# Patient Record
Sex: Female | Born: 1965 | Race: White | Hispanic: No | Smoking: Never smoker
Health system: Southern US, Community
[De-identification: ages and names within clinical notes are randomized; demographics above are authoritative.]

## PROBLEM LIST (undated history)

## (undated) DIAGNOSIS — D649 Anemia, unspecified: Secondary | ICD-10-CM

## (undated) DIAGNOSIS — R42 Dizziness and giddiness: Secondary | ICD-10-CM

## (undated) DIAGNOSIS — R51 Headache: Secondary | ICD-10-CM

## (undated) DIAGNOSIS — K219 Gastro-esophageal reflux disease without esophagitis: Secondary | ICD-10-CM

## (undated) DIAGNOSIS — R519 Headache, unspecified: Secondary | ICD-10-CM

## (undated) DIAGNOSIS — K8020 Calculus of gallbladder without cholecystitis without obstruction: Secondary | ICD-10-CM

## (undated) DIAGNOSIS — Z8719 Personal history of other diseases of the digestive system: Secondary | ICD-10-CM

## (undated) DIAGNOSIS — T4145XA Adverse effect of unspecified anesthetic, initial encounter: Secondary | ICD-10-CM

## (undated) DIAGNOSIS — C801 Malignant (primary) neoplasm, unspecified: Secondary | ICD-10-CM

## (undated) DIAGNOSIS — T8859XA Other complications of anesthesia, initial encounter: Secondary | ICD-10-CM

## (undated) DIAGNOSIS — M1990 Unspecified osteoarthritis, unspecified site: Secondary | ICD-10-CM

## (undated) DIAGNOSIS — M549 Dorsalgia, unspecified: Secondary | ICD-10-CM

## (undated) DIAGNOSIS — S3992XA Unspecified injury of lower back, initial encounter: Secondary | ICD-10-CM

## (undated) DIAGNOSIS — K2270 Barrett's esophagus without dysplasia: Secondary | ICD-10-CM

## (undated) DIAGNOSIS — G8929 Other chronic pain: Secondary | ICD-10-CM

## (undated) DIAGNOSIS — Z9889 Other specified postprocedural states: Secondary | ICD-10-CM

## (undated) HISTORY — DX: Anemia, unspecified: D64.9

## (undated) HISTORY — PX: BREAST EXCISIONAL BIOPSY: SUR124

## (undated) HISTORY — PX: LEEP: SHX91

## (undated) HISTORY — PX: BREAST SURGERY: SHX581

## (undated) HISTORY — DX: Unspecified osteoarthritis, unspecified site: M19.90

## (undated) HISTORY — DX: Barrett's esophagus without dysplasia: K22.70

## (undated) HISTORY — PX: HAND SURGERY: SHX662

## (undated) HISTORY — DX: Personal history of other diseases of the digestive system: Z87.19

## (undated) HISTORY — DX: Other specified postprocedural states: Z98.890

## (undated) HISTORY — PX: CARPAL TUNNEL RELEASE: SHX101

## (undated) HISTORY — PX: TONSILLECTOMY: SUR1361

---

## 2010-03-19 ENCOUNTER — Emergency Department (HOSPITAL_COMMUNITY)
Admission: EM | Admit: 2010-03-19 | Discharge: 2010-03-19 | Payer: Self-pay | Source: Home / Self Care | Admitting: Emergency Medicine

## 2010-08-27 ENCOUNTER — Inpatient Hospital Stay (INDEPENDENT_AMBULATORY_CARE_PROVIDER_SITE_OTHER)
Admission: RE | Admit: 2010-08-27 | Discharge: 2010-08-27 | Disposition: A | Discharge status: Home / Self Care | Payer: Self-pay | Source: Ambulatory Visit | Attending: Emergency Medicine | Admitting: Emergency Medicine

## 2010-08-27 DIAGNOSIS — L559 Sunburn, unspecified: Secondary | ICD-10-CM

## 2010-09-11 ENCOUNTER — Emergency Department (HOSPITAL_COMMUNITY): Payer: Self-pay

## 2010-09-11 ENCOUNTER — Emergency Department (HOSPITAL_COMMUNITY)
Admission: EM | Admit: 2010-09-11 | Discharge: 2010-09-11 | Disposition: A | Discharge status: Home / Self Care | Payer: Self-pay | Attending: Emergency Medicine | Admitting: Emergency Medicine

## 2010-09-11 DIAGNOSIS — IMO0002 Reserved for concepts with insufficient information to code with codable children: Secondary | ICD-10-CM | POA: Insufficient documentation

## 2010-09-11 DIAGNOSIS — M25469 Effusion, unspecified knee: Secondary | ICD-10-CM | POA: Insufficient documentation

## 2010-09-11 DIAGNOSIS — W010XXA Fall on same level from slipping, tripping and stumbling without subsequent striking against object, initial encounter: Secondary | ICD-10-CM | POA: Insufficient documentation

## 2010-09-11 DIAGNOSIS — M25569 Pain in unspecified knee: Secondary | ICD-10-CM | POA: Insufficient documentation

## 2010-09-11 DIAGNOSIS — M25559 Pain in unspecified hip: Secondary | ICD-10-CM | POA: Insufficient documentation

## 2010-09-11 DIAGNOSIS — S8000XA Contusion of unspecified knee, initial encounter: Secondary | ICD-10-CM | POA: Insufficient documentation

## 2010-09-11 LAB — POCT PREGNANCY, URINE: Preg Test, Ur: NEGATIVE

## 2010-12-13 ENCOUNTER — Emergency Department (HOSPITAL_COMMUNITY): Payer: Medicaid Other

## 2010-12-13 ENCOUNTER — Emergency Department (HOSPITAL_COMMUNITY)
Admission: EM | Admit: 2010-12-13 | Discharge: 2010-12-13 | Disposition: A | Discharge status: Home / Self Care | Payer: Medicaid Other | Attending: Emergency Medicine | Admitting: Emergency Medicine

## 2010-12-13 DIAGNOSIS — R10816 Epigastric abdominal tenderness: Secondary | ICD-10-CM | POA: Insufficient documentation

## 2010-12-13 DIAGNOSIS — K819 Cholecystitis, unspecified: Secondary | ICD-10-CM | POA: Insufficient documentation

## 2010-12-13 DIAGNOSIS — R1011 Right upper quadrant pain: Secondary | ICD-10-CM

## 2010-12-13 DIAGNOSIS — R109 Unspecified abdominal pain: Secondary | ICD-10-CM | POA: Insufficient documentation

## 2010-12-13 DIAGNOSIS — K8020 Calculus of gallbladder without cholecystitis without obstruction: Secondary | ICD-10-CM

## 2010-12-13 LAB — DIFFERENTIAL
Lymphocytes Relative: 19 % (ref 12–46)
Lymphs Abs: 1.5 10*3/uL (ref 0.7–4.0)
Monocytes Absolute: 0.7 10*3/uL (ref 0.1–1.0)
Monocytes Relative: 9 % (ref 3–12)
Neutro Abs: 5.5 10*3/uL (ref 1.7–7.7)

## 2010-12-13 LAB — COMPREHENSIVE METABOLIC PANEL
Alkaline Phosphatase: 61 U/L (ref 39–117)
BUN: 19 mg/dL (ref 6–23)
CO2: 25 mEq/L (ref 19–32)
Calcium: 9.3 mg/dL (ref 8.4–10.5)
GFR calc Af Amer: >60 mL/min (ref >60)
GFR calc non Af Amer: >60 mL/min (ref >60)
Glucose, Bld: 116 mg/dL — ABNORMAL HIGH (ref 70–99)
Total Protein: 6.6 g/dL (ref 6.0–8.3)

## 2010-12-13 LAB — CBC
HCT: 41.6 % (ref 36.0–46.0)
Hemoglobin: 14.4 g/dL (ref 12.0–15.0)
MCH: 29.3 pg (ref 26.0–34.0)
MCHC: 34.6 g/dL (ref 30.0–36.0)

## 2010-12-13 LAB — LIPASE, BLOOD: Lipase: 34 U/L (ref 11–59)

## 2010-12-16 NOTE — Consult Note (Signed)
[name], [name]             ACCOUNT NO.:  [id number]  MEDICAL RECORD NO.:  [id number]  LOCATION:  [facility]LED                         FACILITY:  [facility]  PHYSICIAN:  Thornton Park. Daphine Deutscher, MD  DATE OF BIRTH:  08/02/1965  DATE OF CONSULTATION:  12/13/2010 DATE OF DISCHARGE:  12/13/2010                                CONSULTATION   TIME:  1100 hours.  CHIEF COMPLAINT:  Right upper quadrant pain and gallstones.  HISTORY:  [name] is a 45 year old white female who was out at a bar last night and ate some cheesy fries and about 3:00 a.m. began having some mid epigastric pain and some vomiting.  She was seen today in the ED and was found to have this epigastric tenderness which we said was severe and underwent a gallbladder ultrasound which showed a subcentimeter mobile gallstones.  There was no positive Murphy's sign and no pericholecystic fluid and there was no evidence of wall thickening.  The pain started around 3 a.m. and she got something for pain.  By the time I saw her at 11 a.m., her pain was much better.  We discussed gallbladder management with her and I think she would prefer to go home and she can come back to our office to discuss elective cholecystectomy.  She was advised to stay on a pretty much liquid diet today and tomorrow.  I gave her some Tylox to take as needed for pain.  PAST MEDICAL AND SURGICAL HISTORY:  She sees Dr. Modesto Charon, a physiatrist, who has been doing some radiofrequency ablation of her lower back pain. She said this was related to a fall at work.  She also has a history of migraines and previous surgery includes a LEEP procedure of the cervix and tonsillectomy.  SOCIAL HISTORY:  She denies drug use.  She has smoked, occasionally drinks.  ALLERGIES:  She listed an allergy to ASPIRIN.  MEDICATIONS:  She does take Vicodin at home as needed for pain but that did not seem to help this time.  PHYSICAL EXAM:  VITAL SIGNS:  Blood pressure 130/77,  pulse 67, respirations 16, afebrile. GENERAL:  She is in no acute distress.  Sitting up.  Striking that she is moderately obese with mammary hypertrophy. ABDOMEN:  She is still having some residual soreness in her mid epigastric area but no exquisite tenderness or guarding.  PERTINENT LABORATORY AND X-RAY DATA:  Ultrasound report:  Gallstones without ultrasound evidence of cholecystitis or biliary obstruction. She was felt to have fatty infiltration of her liver and this corresponds to her habitus.  Laboratory included a normal lipase, electrolytes were normal and the only elevations were of her glucose which was 116, SGOT of 84, SGPT of 64.  Normal alkaline phosphatase and normal bilirubin.  White count was 7700 with a hemoglobin of 14.4.  IMPRESSION:  Gallstones.  RECOMMENDATIONS: 1. Discussed for elective cholecystectomy. 2. Discharge with Tylox if needed for pain. 3. Follow up in the [facility] office by available     surgeon.  CONDITION:  Stable.     Thornton Park Daphine Deutscher, MD     MBM/MEDQ  D:  12/13/2010  T:  12/13/2010  Job:  [id number]  Electronically Signed by Luretha Murphy MD on 12/16/2010 07:32:57 AM

## 2011-04-06 ENCOUNTER — Encounter (HOSPITAL_COMMUNITY): Payer: Self-pay | Admitting: *Deleted

## 2011-04-06 ENCOUNTER — Emergency Department (HOSPITAL_COMMUNITY)
Admission: EM | Admit: 2011-04-06 | Discharge: 2011-04-06 | Disposition: A | Discharge status: Home / Self Care | Payer: Self-pay | Attending: Emergency Medicine | Admitting: Emergency Medicine

## 2011-04-06 DIAGNOSIS — T63391A Toxic effect of venom of other spider, accidental (unintentional), initial encounter: Secondary | ICD-10-CM | POA: Insufficient documentation

## 2011-04-06 DIAGNOSIS — R110 Nausea: Secondary | ICD-10-CM | POA: Insufficient documentation

## 2011-04-06 DIAGNOSIS — W57XXXA Bitten or stung by nonvenomous insect and other nonvenomous arthropods, initial encounter: Secondary | ICD-10-CM

## 2011-04-06 DIAGNOSIS — R21 Rash and other nonspecific skin eruption: Secondary | ICD-10-CM | POA: Insufficient documentation

## 2011-04-06 DIAGNOSIS — T6391XA Toxic effect of contact with unspecified venomous animal, accidental (unintentional), initial encounter: Secondary | ICD-10-CM | POA: Insufficient documentation

## 2011-04-06 DIAGNOSIS — M79609 Pain in unspecified limb: Secondary | ICD-10-CM | POA: Insufficient documentation

## 2011-04-06 HISTORY — DX: Headache, unspecified: R51.9

## 2011-04-06 HISTORY — DX: Dizziness and giddiness: R42

## 2011-04-06 HISTORY — DX: Headache: R51

## 2011-04-06 HISTORY — DX: Unspecified injury of lower back, initial encounter: S39.92XA

## 2011-04-06 MED ORDER — DIPHENHYDRAMINE HCL 50 MG/ML IJ SOLN: 50.0000 mg | Freq: Once | INTRAMUSCULAR | Status: DC

## 2011-04-06 MED ORDER — DIPHENHYDRAMINE HCL 25 MG PO CAPS
50.0000 mg | ORAL_CAPSULE | Freq: Once | ORAL | Status: AC
  Administered 2011-04-06: 50 mg via ORAL
  Filled 2011-04-06: qty 2

## 2011-04-06 NOTE — ED Notes (Signed)
She was bitten by a spider on her rt upper arm this afternoon.  She pulled  The spider out of her blouse and killed it.  She has the spider with her.  She has a swollen area surrounding the area on her ar,  Painful burning and stinging

## 2011-04-06 NOTE — ED Provider Notes (Signed)
History     CSN: [id number]  Arrival date & time 04/06/11  4132   First MD Initiated Contact with Patient 04/06/11 1925      Chief Complaint  Patient presents with  . Insect Bite    (Consider location/radiation/quality/duration/timing/severity/associated sxs/prior treatment) Patient is a 46 y.o. female presenting with rash. The history is provided by the patient. No language interpreter was used.  Rash  This is a new problem. The current episode started 3 to 5 hours ago. The problem is associated with a spider bite. There has been no fever. The rash is present on the right arm. The pain is at a severity of 2/10. The pain is mild. The pain has been constant since onset. Associated symptoms include pain. Pertinent negatives include no itching. She has tried nothing for the symptoms. The treatment provided no relief.    Past Medical History  Diagnosis Date  . Head ache   . Vertigo   . Back injury     History reviewed. No pertinent past surgical history.  History reviewed. No pertinent family history.  History  Substance Use Topics  . Smoking status: Never Smoker   . Smokeless tobacco: Not on file  . Alcohol Use: Yes    OB History    Grav Para Term Preterm Abortions TAB SAB Ect Mult Living                  Review of Systems  Constitutional: Negative for fever and chills.  Gastrointestinal: Positive for nausea. Negative for diarrhea and constipation.  Skin: Positive for rash. Negative for itching.  All other systems reviewed and are negative.    Allergies  Review of patient's allergies indicates no known allergies.  Home Medications   Current Outpatient Rx  Name Route Sig Dispense Refill  . HYDROCODONE-ACETAMINOPHEN 5-325 MG PO TABS Oral Take 1 tablet by mouth every 6 (six) hours as needed. For pain    . MECLIZINE HCL 25 MG PO TABS Oral Take 25 mg by mouth 3 (three) times daily as needed. For dizziness    . RANITIDINE HCL 150 MG PO TABS Oral Take 150 mg by  mouth 2 (two) times daily.      BP 117/68  Pulse 72  Temp(Src) 98.3 F (36.8 C) (Oral)  Resp 20  SpO2 98%  LMP 03/30/2011  Physical Exam  Nursing note and vitals reviewed. Constitutional: She is oriented to person, place, and time. She appears well-developed and well-nourished. No distress.  HENT:  Head: Normocephalic and atraumatic.  Eyes: EOM are normal. Pupils are equal, round, and reactive to light.  Neck: Normal range of motion. Neck supple.  Cardiovascular: Normal rate and regular rhythm.  Exam reveals no friction rub.   No murmur heard. Pulmonary/Chest: Effort normal and breath sounds normal. No respiratory distress. She has no wheezes. She has no rales.  Abdominal: Soft. She exhibits no distension. There is no tenderness. There is no rebound.  Musculoskeletal: Normal range of motion. She exhibits no edema.       Arms: Neurological: She is alert and oriented to person, place, and time.  Skin: She is not diaphoretic.    ED Course  Procedures (including critical care time)  Labs Reviewed - No data to display No results found.   1. Insect bite       MDM  46 year old female presents with insect bite to right arm. Happened 2-3 hours ago. Patient reports some pain and aching feeling in that arm. She had tingling  in her hand which has resolved. Mild redness and mild swelling at spotlight. She killed a spider that his entire sure. Unable to identify spider as the spider is crushed and not to be full specimen. Vitals are stable. She has a small area consistent with a bug bite on her right lateral upper arm. She's neurovascularly intact distally. Patient given Benadryl. Patient stable for discharge instructed to watch herself if she develops chest pain, shortness breath, bleeding/bruising. Instructed to follow up up with her regular doctor in 2 days.      Elwin Mocha, MD 04/06/11 2037

## 2011-04-06 NOTE — ED Notes (Signed)
No rx, pt voiced understanding to f/u with PCP in 2 days if sx do not improve

## 2011-04-07 NOTE — ED Provider Notes (Signed)
I saw and evaluated the patient, reviewed the resident's note and I agree with the findings and plan.  Neurovascularly intact. Appears well. Supportive care, home.  Forbes Cellar, MD 04/07/11 [phone]

## 2011-05-25 ENCOUNTER — Encounter (HOSPITAL_COMMUNITY): Payer: Self-pay | Admitting: Emergency Medicine

## 2011-05-25 ENCOUNTER — Emergency Department (INDEPENDENT_AMBULATORY_CARE_PROVIDER_SITE_OTHER)
Admission: EM | Admit: 2011-05-25 | Discharge: 2011-05-25 | Disposition: A | Discharge status: Home / Self Care | Payer: Self-pay | Source: Home / Self Care | Attending: Emergency Medicine | Admitting: Emergency Medicine

## 2011-05-25 DIAGNOSIS — J329 Chronic sinusitis, unspecified: Secondary | ICD-10-CM

## 2011-05-25 MED ORDER — FLUTICASONE PROPIONATE 50 MCG/ACT NA SUSP: 2.0000 | Freq: Every day | NASAL | Status: DC

## 2011-05-25 MED ORDER — DOXYCYCLINE HYCLATE 100 MG PO CAPS: 100.0000 mg | ORAL_CAPSULE | Freq: Two times a day (BID) | ORAL | Status: DC

## 2011-05-25 MED ORDER — PSEUDOEPHEDRINE-GUAIFENESIN ER 120-1200 MG PO TB12: 1.0000 | ORAL_TABLET | Freq: Two times a day (BID) | ORAL | Status: DC

## 2011-05-25 MED ORDER — IBUPROFEN 600 MG PO TABS: 600.0000 mg | ORAL_TABLET | Freq: Four times a day (QID) | ORAL | Status: DC | PRN

## 2011-05-25 NOTE — ED Provider Notes (Signed)
History     CSN: [id number]  Arrival date & time 05/25/11  1534   First MD Initiated Contact with Patient 05/25/11 1704      Chief Complaint  Patient presents with  . URI    (Consider location/radiation/quality/duration/timing/severity/associated sxs/prior treatment) HPI Comments: Patient with bilateral frontal and maxillary sinus pain/pressure for the past 5 days. Reports pain is worse with tilting her head, bending forward, lying down. Reports bilateral ear "stuffiness", for which she's able to clear when she yawns. States her teeth hurt, but denies any dental problems No fevers, purulent nasal discharge, postnasal drip, sore throat, neck pain, coughing, wheezing, abdominal pain, rash. No itchy, watery eyes, sneezing. No periorbital swelling. No other headaches. Patient has been taking Mucinex, using a neti pot, and Sudafed, which she states usually takes care of her symptoms, but is not working this time.   ROS as noted in HPI. All other ROS negative.   Patient is a 46 y.o. female presenting with URI. The history is provided by the patient. No language interpreter was used.  URI The current episode started 3 to 5 days ago. This is a new problem. The problem has been gradually worsening.  Symptoms associated with the illness include facial pain, sinus pressure, congestion and rhinorrhea.    Past Medical History  Diagnosis Date  . Head ache   . Vertigo   . Back injury     Past Surgical History  Procedure Date  . Hand tendon surgery   . Breast surgery   . Leep     History reviewed. No pertinent family history.  History  Substance Use Topics  . Smoking status: Never Smoker   . Smokeless tobacco: Not on file  . Alcohol Use: Yes    OB History    Grav Para Term Preterm Abortions TAB SAB Ect Mult Living                  Review of Systems  HENT: Positive for congestion, rhinorrhea and sinus pressure.     Allergies  Review of patient's allergies indicates no known  allergies.  Home Medications   Current Outpatient Rx  Name Route Sig Dispense Refill  . DOXYCYCLINE HYCLATE 100 MG PO CAPS Oral Take 1 capsule (100 mg total) by mouth 2 (two) times daily. X 7 days 14 capsule 0  . FLUTICASONE PROPIONATE 50 MCG/ACT NA SUSP Nasal Place 2 sprays into the nose daily. 16 g 0  . HYDROCODONE-ACETAMINOPHEN 5-325 MG PO TABS Oral Take 1 tablet by mouth every 6 (six) hours as needed. For pain    . IBUPROFEN 600 MG PO TABS Oral Take 1 tablet (600 mg total) by mouth every 6 (six) hours as needed for pain. 30 tablet 0  . PSEUDOEPHEDRINE-GUAIFENESIN ER [phone] MG PO TB12 Oral Take 1 tablet by mouth 2 (two) times daily. 20 each 0  . RANITIDINE HCL 150 MG PO TABS Oral Take 150 mg by mouth 2 (two) times daily.      BP 119/86  Pulse 88  Temp(Src) 98.3 F (36.8 C) (Oral)  Resp 16  SpO2 97%  LMP 05/21/2011  Physical Exam  Nursing note and vitals reviewed. Constitutional: She is oriented to person, place, and time. She appears well-developed and well-nourished.  HENT:  Head: Normocephalic and atraumatic. No trismus in the jaw.  Right Ear: Tympanic membrane and ear canal normal.  Left Ear: Tympanic membrane and ear canal normal.  Nose: Mucosal edema and rhinorrhea present. Right sinus exhibits maxillary  sinus tenderness and frontal sinus tenderness. Left sinus exhibits maxillary sinus tenderness and frontal sinus tenderness.  Mouth/Throat: Uvula is midline and mucous membranes are normal. Normal dentition. Posterior oropharyngeal erythema present. No oropharyngeal exudate.       (-) frontal, maxillary sinus tenderness  Eyes: Conjunctivae and EOM are normal. Pupils are equal, round, and reactive to light.  Neck: Normal range of motion. Neck supple.  Cardiovascular: Normal rate, regular rhythm and normal heart sounds.   Pulmonary/Chest: Effort normal and breath sounds normal. No respiratory distress. She has no wheezes. She has no rales.  Abdominal: She exhibits no  distension. There is no tenderness. There is no rebound and no guarding.  Musculoskeletal: Normal range of motion.  Lymphadenopathy:    She has no cervical adenopathy.  Neurological: She is alert and oriented to person, place, and time.  Skin: Skin is warm and dry. No rash noted.  Psychiatric: She has a normal mood and affect. Her behavior is normal. Judgment and thought content normal.    ED Course  Procedures (including critical care time)  Labs Reviewed - No data to display No results found.   1. Sinusitis       MDM  .No fevers >102, has had sx for < 10 days, no h/o double sickening. No historical or objective evidence of bacterial infection. Will start flonase, mucinex-d, increase fluids, have her continue nasal saline irrigation,  tylenol/motrin prn pain. Sending home with antibiotics, as patient has no primary care physician. Advised her to wait several more days before filling this. Discussed MDM and plan with pt. Pt agrees with plan and will f/u with PMD of choice prn.    Luiz Blare, MD 05/25/11 [phone]

## 2011-05-25 NOTE — Discharge Instructions (Signed)
Take the medication as written. Return if you get worse, have a fever >100.4, or for any concerns. You may take 600 mg of motrin with 1 gram of tylenol up to 4 times a day as needed for pain. This is an effective combination for pain.  Most sinus infections are viral and do not need antibiotics unless you have a high fever, have had this for 10 day, or you get better and then get sick again. Use a neti pot or the NeilMed sinus rinse as often as you want to to reduce nasal congestion. Follow the directions on the box.   Go to www.goodrx.com to look up your medications. This will give you a list of where you can find your prescriptions at the most affordable prices.   Go to www.goodrx.com to look up your medications. This will give you a list of where you can find your prescriptions at the most affordable prices.   RESOURCE GUIDE  Insufficient Money for Medicine Contact [employer]:  call "211" or [facility] [phone].  No Primary Care Doctor Call [facility]  [phone] Other agencies that provide inexpensive medical care    [facility]  [phone]    [facility]  [phone]    [facility]  [phone]    [facility]  [phone] [address] [place] [address]    [facility]  [phone]    [facility]  [phone] [facility] [phone]   [address] [place] [address] [place], [place] [address]  [facility] Resources  [facility]     [employer]                          [facility] [address] [place]                       [address]      [address] [place] [address]   [phone] (After Hours)

## 2011-05-25 NOTE — ED Notes (Signed)
C/o sinus pressure, head/face/jaw pressure.  Onset last Friday of symptoms.  Denies fever.  Does have a cough

## 2011-05-27 ENCOUNTER — Emergency Department (HOSPITAL_COMMUNITY)
Admission: EM | Admit: 2011-05-27 | Discharge: 2011-05-27 | Disposition: A | Discharge status: Home / Self Care | Payer: Self-pay | Attending: Emergency Medicine | Admitting: Emergency Medicine

## 2011-05-27 ENCOUNTER — Encounter (HOSPITAL_COMMUNITY): Payer: Self-pay

## 2011-05-27 DIAGNOSIS — G43909 Migraine, unspecified, not intractable, without status migrainosus: Secondary | ICD-10-CM | POA: Insufficient documentation

## 2011-05-27 MED ORDER — KETOROLAC TROMETHAMINE 30 MG/ML IJ SOLN
30.0000 mg | Freq: Once | INTRAMUSCULAR | Status: AC
  Administered 2011-05-27: 30 mg via INTRAVENOUS
  Filled 2011-05-27: qty 1

## 2011-05-27 MED ORDER — METOCLOPRAMIDE HCL 5 MG/ML IJ SOLN
10.0000 mg | Freq: Once | INTRAMUSCULAR | Status: AC
  Administered 2011-05-27: 10 mg via INTRAVENOUS
  Filled 2011-05-27: qty 2

## 2011-05-27 MED ORDER — DIPHENHYDRAMINE HCL 50 MG/ML IJ SOLN
25.0000 mg | Freq: Once | INTRAMUSCULAR | Status: AC
  Administered 2011-05-27: 25 mg via INTRAVENOUS
  Filled 2011-05-27: qty 1

## 2011-05-27 NOTE — Discharge Instructions (Signed)
Migraine Headache A migraine is very bad pain on one or both sides of your head. The cause of a migraine is not always known. A migraine can be triggered or caused by different things, such as:  Alcohol.   Smoking.   Stress.   Periods (menstruation) in women.   Aged cheeses.   Foods or drinks that contain nitrates, glutamate, aspartame, or tyramine.   Lack of sleep.   Chocolate.   Caffeine.   Hunger.   Medicines, such as nitroglycerine (used to treat chest pain), birth control pills, estrogen, and some blood pressure medicines.  HOME CARE  Many medicines can help migraine pain or keep migraines from coming back. Your doctor can help you decide on a medicine or treatment program.   If you or your child gets a migraine, it may help to lie down in a dark, quiet room.   Keep a headache journal. This may help find out what is causing the headaches. For example, write down:   What you eat and drink.   How much sleep you get.   Any change to your diet or medicines.  GET HELP RIGHT AWAY IF:   The medicine does not work.   The pain begins again.   The neck is stiff.   You have trouble seeing.   The muscles are weak or you lose muscle control.   You have new symptoms.   You lose your balance.   You have trouble walking.   You feel faint or pass out.  MAKE SURE YOU:   Understand these instructions.   Will watch this condition.   Will get help right away if you are not doing well or get worse.  Document Released: 12/16/2007 Document Revised: 02/25/2011 Document Reviewed: 11/11/2008 ExitCare Patient Information 2012 [employer].Recurrent Migraine Headache You have a recurrent migraine headache. The caregiver can usually provide good relief for this headache. If this headache is the same as your previous migraine headaches, it is safe to treat you without repeating a complete evaluation.  These headaches usually have at least two of the following problems:    They occur on one side of the head, pulsate, and are severe enough to prevent daily activities.   They are aggravated by daily physical activities.  You may have one or more of the following symptoms:   Nausea (feeling sick to your stomach).   Vomiting.   Pain with exposure to bright lights or loud noises.  Most headache sufferers have a family history of migraines. Your headaches may also be related to alcohol and smoking habits. Too much sleep, too little sleep, mood, and anxiety may also play a part. Changing some of these triggers may help you lower the number and level of pain of the headaches. Headaches may be related to menses (female menstruation). There are numerous medications that can prevent these headaches. Your caregiver can help you with a medication or regimen (procedure to follow). If this has been a chronic (long-term) condition, the use of long-term narcotics is not recommended. Using long-term narcotics can cause recurrent migraines. Narcotics are only a temporary measure only. They are used for the infrequent migraine that fails to respond to all other measures. SEEK MEDICAL CARE IF:   You do not get relief from the medications given to you.   You have a recurrence of pain.   This headache begins to differ from past migraine (for example if it is more severe).  SEEK IMMEDIATE MEDICAL CARE IF:  You   have a fever.   You have a stiff neck.   You have vision loss or have changes in vision.   You have problems with feeling lightheaded, become faint, or lose your balance.   You have muscular weakness.   You have loss of muscular control.   You develop severe symptoms different from your first symptoms.   You start losing your balance or have trouble walking.   You feel faint or pass out.  MAKE SURE YOU:   Understand these instructions.   Will watch your condition.   Will get help right away if you are not doing well or get worse.  Document Released:  12/01/2000 Document Revised: 02/25/2011 Document Reviewed: 10/26/2007 ExitCare Patient Information 2012 [employer]. 

## 2011-05-27 NOTE — ED Notes (Signed)
Pt presents with 30-hour h/o migraine headache.  Pt reports frontal headache that radiates around to occipital area that was relieved with medication yesterday, but can not control pain today.  +photophobia, nausea and vomiting.

## 2011-05-27 NOTE — ED Notes (Signed)
Pt. Left with family member to drive. Pt. Stable.

## 2011-05-27 NOTE — ED Provider Notes (Signed)
History     CSN: [id number]  Arrival date & time 05/27/11  1120   First MD Initiated Contact with Patient 05/27/11 1251      Chief Complaint  Patient presents with  . Migraine    (Consider location/radiation/quality/duration/timing/severity/associated sxs/prior treatment) Patient is a 46 y.o. female presenting with migraine.  Migraine    Patient presents to emergency department complaining of gradual onset migraine that began at 6 AM yesterday morning stating approximately 30 hours headache. Patient states she's had history of recurrent migraines she thinks are "hormonal and usually around my periods." Patient states she is due for menstruation and therefore thinks this is a hormonal migraine. Patient states headache is similar to her migraines in the past. Patient states gradual onset of headache yesterday at 6am and therefore she took over-the-counter medication which usually resolves symptoms however she had persistent migraine and took a Norco at noon with slight decrease in pain but persistent pain and therefore took another Norco last night around 8 PM without relief of pain. Patient states she has Norco prescription because of chronic back pain. Patient states she's taken no other medication today with ongoing persistent headache. Patient states headache is associated with photophobia, phonophobia, and nauseousness. She denies any vomiting. Patient denies fevers, chills, dizziness, visual changes, neck stiffness, chest pain, shortness of breath, rash. Patient states she was seen in urgent care last week for sinus congestion and prescribed over-the-counter decongestants.  Past Medical History  Diagnosis Date  . Head ache   . Vertigo   . Back injury     Past Surgical History  Procedure Date  . Hand tendon surgery   . Breast surgery   . Leep     No family history on file.  History  Substance Use Topics  . Smoking status: Never Smoker   . Smokeless tobacco: Not on file  .  Alcohol Use: Yes    OB History    Grav Para Term Preterm Abortions TAB SAB Ect Mult Living                  Review of Systems  All other systems reviewed and are negative.    Allergies  Review of patient's allergies indicates no known allergies.  Home Medications   Current Outpatient Rx  Name Route Sig Dispense Refill  . FLUTICASONE PROPIONATE 50 MCG/ACT NA SUSP Nasal Place 2 sprays into the nose daily. 16 g 0  . HYDROCODONE-ACETAMINOPHEN 5-325 MG PO TABS Oral Take 1 tablet by mouth every 6 (six) hours as needed. For pain    . IBUPROFEN 200 MG PO TABS Oral Take 400 mg by mouth every 6 (six) hours as needed. For pain    . KRILL OIL OMEGA-3 PO Oral Take 1 capsule by mouth daily.    . ADULT MULTIVITAMIN W/MINERALS CH Oral Take 1 tablet by mouth daily.    Marland Kitchen PSEUDOEPHEDRINE-GUAIFENESIN ER [phone] MG PO TB12 Oral Take 1 tablet by mouth 2 (two) times daily. 20 each 0  . PSYLLIUM HUSK POWD Oral Take 1 capsule by mouth daily.    Marland Kitchen RANITIDINE HCL 150 MG PO TABS Oral Take 150 mg by mouth 2 (two) times daily.      BP 123/82  Pulse 76  Temp(Src) 98.1 F (36.7 C) (Oral)  Resp 16  Ht 5\' 8"  (1.727 m)  Wt 270 lb (122.471 kg)  BMI 41.05 kg/m2  SpO2 97%  LMP 05/21/2011  Physical Exam  Nursing note and vitals reviewed. Constitutional: She is  oriented to person, place, and time. She appears well-developed and well-nourished. No distress.       Morbidly obese  HENT:  Head: Normocephalic and atraumatic.  Eyes: Conjunctivae and EOM are normal. Pupils are equal, round, and reactive to light. Right eye exhibits no nystagmus. Left eye exhibits no nystagmus.  Neck: Normal range of motion. Neck supple. No Brudzinski's sign and no Kernig's sign noted.  Cardiovascular: Normal rate, regular rhythm, normal heart sounds and intact distal pulses.  Exam reveals no gallop and no friction rub.   No murmur heard. Pulmonary/Chest: Effort normal and breath sounds normal. No respiratory distress. She has  no wheezes. She has no rales. She exhibits no tenderness.  Abdominal: Soft. Bowel sounds are normal. She exhibits no distension and no mass. There is no tenderness. There is no rebound and no guarding.  Musculoskeletal: Normal range of motion. She exhibits no edema and no tenderness.  Neurological: She is alert and oriented to person, place, and time. No cranial nerve deficit.  Skin: Skin is warm and dry. No rash noted. She is not diaphoretic. No erythema.  Psychiatric: She has a normal mood and affect.    ED Course  Procedures (including critical care time)  Intravenous Toradol, Benadryl, and Reglan.  1:36 PM Patient reports improvement of HA.  2:03 PM Patient states HA has now decreased to 3/10 and is ready to go home and follow up with ObGyn on March 15th to discuss "hormonal headaches and menopause."   Labs Reviewed - No data to display No results found.   1. Migraine       MDM  Patient is afebrile and nontoxic-appearing. She has no meningeal signs. Patient states her headache is similar to her years of recurrent migraine headaches without any significant differences. Patient's headache is significantly improved throughout ER stay. She has no neuro focal findings, ambulating without difficulty.        Jenness Corner, [place] 05/27/11 [phone]

## 2011-05-28 NOTE — ED Provider Notes (Signed)
Medical screening examination/treatment/procedure(s) were performed by non-physician practitioner and as supervising physician I was immediately available for consultation/collaboration.  [name] [name], MD 05/28/11 0917 

## 2011-06-18 ENCOUNTER — Encounter (HOSPITAL_COMMUNITY): Payer: Self-pay

## 2011-06-18 ENCOUNTER — Emergency Department (HOSPITAL_COMMUNITY)
Admission: EM | Admit: 2011-06-18 | Discharge: 2011-06-18 | Disposition: A | Discharge status: Home / Self Care | Payer: Self-pay | Attending: Emergency Medicine | Admitting: Emergency Medicine

## 2011-06-18 DIAGNOSIS — R10819 Abdominal tenderness, unspecified site: Secondary | ICD-10-CM | POA: Insufficient documentation

## 2011-06-18 DIAGNOSIS — R109 Unspecified abdominal pain: Secondary | ICD-10-CM | POA: Insufficient documentation

## 2011-06-18 DIAGNOSIS — K8020 Calculus of gallbladder without cholecystitis without obstruction: Secondary | ICD-10-CM | POA: Insufficient documentation

## 2011-06-18 HISTORY — DX: Calculus of gallbladder without cholecystitis without obstruction: K80.20

## 2011-06-18 LAB — CBC
HCT: 42.1 % (ref 36.0–46.0)
Hemoglobin: 14.6 g/dL (ref 12.0–15.0)
MCV: 87.2 fL (ref 78.0–100.0)
RDW: 12.1 % (ref 11.5–15.5)
WBC: 7.1 10*3/uL (ref 4.0–10.5)

## 2011-06-18 LAB — COMPREHENSIVE METABOLIC PANEL
AST: 21 U/L (ref 0–37)
Albumin: 3.3 g/dL — ABNORMAL LOW (ref 3.5–5.2)
Chloride: 105 mEq/L (ref 96–112)
Creatinine, Ser: 0.74 mg/dL (ref 0.50–1.10)
Potassium: 3.6 mEq/L (ref 3.5–5.1)
Total Bilirubin: 0.2 mg/dL — ABNORMAL LOW (ref 0.3–1.2)
Total Protein: 6.3 g/dL (ref 6.0–8.3)

## 2011-06-18 LAB — URINALYSIS, ROUTINE W REFLEX MICROSCOPIC
Bilirubin Urine: NEGATIVE
Glucose, UA: NEGATIVE mg/dL
Hgb urine dipstick: NEGATIVE
Specific Gravity, Urine: 1.028 (ref 1.005–1.030)
pH: 5 (ref 5.0–8.0)

## 2011-06-18 LAB — LIPASE, BLOOD: Lipase: 36 U/L (ref 11–59)

## 2011-06-18 MED ORDER — SODIUM CHLORIDE 0.9 % IV BOLUS (SEPSIS)
1000.0000 mL | Freq: Once | INTRAVENOUS | Status: AC
  Administered 2011-06-18: 1000 mL via INTRAVENOUS

## 2011-06-18 MED ORDER — MORPHINE SULFATE 4 MG/ML IJ SOLN
4.0000 mg | Freq: Once | INTRAMUSCULAR | Status: AC
  Administered 2011-06-18: 4 mg via INTRAVENOUS
  Filled 2011-06-18: qty 1

## 2011-06-18 MED ORDER — HYDROMORPHONE HCL PF 1 MG/ML IJ SOLN: 0.5000 mg | Freq: Once | INTRAMUSCULAR | Status: DC

## 2011-06-18 MED ORDER — ONDANSETRON HCL 4 MG PO TABS: 4.0000 mg | ORAL_TABLET | Freq: Four times a day (QID) | ORAL | Status: DC

## 2011-06-18 MED ORDER — OXYCODONE-ACETAMINOPHEN 5-325 MG PO TABS: 1.0000 | ORAL_TABLET | Freq: Four times a day (QID) | ORAL | Status: AC | PRN

## 2011-06-18 MED ORDER — ONDANSETRON HCL 4 MG/2ML IJ SOLN
4.0000 mg | Freq: Once | INTRAMUSCULAR | Status: AC
  Administered 2011-06-18: 4 mg via INTRAVENOUS
  Filled 2011-06-18: qty 2

## 2011-06-18 NOTE — Discharge Instructions (Signed)

## 2011-06-18 NOTE — ED Notes (Signed)
Pt presented to the with RUQ pain onset last night radiating to the back characterized as squeezing pain with nausea. Pt is NAD, skin is warm and dry, respiration is even and unlabored.

## 2011-06-18 NOTE — ED Notes (Signed)
Pt claimed that her pain is better. 

## 2011-06-18 NOTE — ED Provider Notes (Signed)
History     CSN: [id number]  Arrival date & time 06/18/11  0804   First MD Initiated Contact with Patient 06/18/11 [phone]      Chief Complaint  Patient presents with  . Abdominal Pain    (Consider location/radiation/quality/duration/timing/severity/associated sxs/prior treatment) HPI  Pt presents to the ED with complaints of right upper quadrant abdominal pain. The patient was seen here on 12/13/2010 for the same and evaluated by surgery. They decided to do conservative treatment and pt was told that she could do the surgery on an outpatient basis. Pt says that she has been trying dietary changes so far. Last night she began to feel nauseous and had a dull mild pain. Then she she woke up this morning and went to the bathroom the pain became very significant. It is now a 8/10 and "feels like someone is smacking her in the back with a 2 x 4". She states " I know what this is, its my gall bladder again. This feels just like last time. Only, not as bad because I didn't wait as long to come in this time". Pt denies episodes of vomiting, diarrhea, bloody stools or any other associated symptoms such as fevers and  Chills.  Past Medical History  Diagnosis Date  . Head ache   . Vertigo   . Back injury   . Gallstones     Past Surgical History  Procedure Date  . Hand tendon surgery   . Breast surgery   . Leep   . Tonsillectomy     No family history on file.  History  Substance Use Topics  . Smoking status: Never Smoker   . Smokeless tobacco: Not on file  . Alcohol Use: No    OB History    Grav Para Term Preterm Abortions TAB SAB Ect Mult Living                  Review of Systems  All other systems reviewed and are negative.    Allergies  Review of patient's allergies indicates no known allergies.  Home Medications   Current Outpatient Rx  Name Route Sig Dispense Refill  . HYDROCODONE-ACETAMINOPHEN 5-325 MG PO TABS Oral Take 1 tablet by mouth every 6 (six) hours as  needed. For pain    . IBUPROFEN 200 MG PO TABS Oral Take 400 mg by mouth every 6 (six) hours as needed. For pain    . KRILL OIL OMEGA-3 PO Oral Take 1 capsule by mouth daily.    . ADULT MULTIVITAMIN W/MINERALS CH Oral Take 1 tablet by mouth daily.    Marland Kitchen RANITIDINE HCL 150 MG PO TABS Oral Take 150 mg by mouth 2 (two) times daily.      BP 118/75  Pulse 84  Temp(Src) 97.6 F (36.4 C) (Oral)  Resp 16  Ht 5\' 8"  (1.727 m)  Wt 272 lb (123.378 kg)  BMI 41.36 kg/m2  SpO2 95%  LMP 05/25/2011  Physical Exam  Nursing note and vitals reviewed. Constitutional: She appears well-developed and well-nourished. No distress.  HENT:  Head: Normocephalic and atraumatic.  Eyes: Pupils are equal, round, and reactive to light.  Neck: Normal range of motion. Neck supple.  Cardiovascular: Normal rate and regular rhythm.   Pulmonary/Chest: Effort normal.  Abdominal: Soft. She exhibits no distension and no mass. There is tenderness (RUQ and epigastric). There is no rebound and no guarding.       Exam limited due to patients large body habitus  Neurological: She  is alert.  Skin: Skin is warm and dry.    ED Course  Procedures (including critical care time)  Labs Reviewed  COMPREHENSIVE METABOLIC PANEL - Abnormal; Notable for the following:    Glucose, Bld 104 (*)    Albumin 3.3 (*)    Total Bilirubin 0.2 (*)    All other components within normal limits  URINALYSIS, ROUTINE W REFLEX MICROSCOPIC - Abnormal; Notable for the following:    APPearance CLOUDY (*)    All other components within normal limits  LIPASE, BLOOD  CBC  URINE MICROSCOPIC-ADD ON   No results found.   1. Abdominal pain       MDM  Pts pain and nausea easily controlled in the ED with two rounds of 4mg  morphine IV and 1 dose of Zofran. Her labs have come back unremarkable and the patient has confirmed gall stones from 12/13/2011.  I have discussed pt with Dr. Fonnie Jarvis  And this patient is a good candidate for outpatient Surgery.  The patient has been informed of this plan and is comfortable.  I will Rx Percocet (12 tabs) and Zofran .  Pt has been advised of the symptoms that warrant their return to the ED. Patient has voiced understanding and has agreed to follow-up with the PCP or specialist.         Dorthula Matas, PA 06/18/11 1005

## 2011-06-18 NOTE — ED Notes (Signed)
Pt presented to the ER with c/o RUQ pain radiating to the back onset last night with nausea, vomiting

## 2011-06-18 NOTE — ED Provider Notes (Signed)
Medical screening examination/treatment/procedure(s) were performed by non-physician practitioner and as supervising physician I was immediately available for consultation/collaboration.  Hurman Horn, MD 06/18/11 [phone]

## 2011-06-20 ENCOUNTER — Encounter (HOSPITAL_COMMUNITY): Payer: Self-pay | Admitting: Emergency Medicine

## 2011-06-20 ENCOUNTER — Emergency Department (HOSPITAL_COMMUNITY): Payer: Self-pay

## 2011-06-20 ENCOUNTER — Emergency Department (HOSPITAL_COMMUNITY)
Admission: EM | Admit: 2011-06-20 | Discharge: 2011-06-20 | Disposition: A | Discharge status: Home / Self Care | Payer: Self-pay | Attending: Emergency Medicine | Admitting: Emergency Medicine

## 2011-06-20 DIAGNOSIS — M546 Pain in thoracic spine: Secondary | ICD-10-CM | POA: Insufficient documentation

## 2011-06-20 DIAGNOSIS — Z79899 Other long term (current) drug therapy: Secondary | ICD-10-CM | POA: Insufficient documentation

## 2011-06-20 DIAGNOSIS — Z7982 Long term (current) use of aspirin: Secondary | ICD-10-CM | POA: Insufficient documentation

## 2011-06-20 DIAGNOSIS — R1011 Right upper quadrant pain: Secondary | ICD-10-CM | POA: Insufficient documentation

## 2011-06-20 DIAGNOSIS — K8050 Calculus of bile duct without cholangitis or cholecystitis without obstruction: Secondary | ICD-10-CM

## 2011-06-20 DIAGNOSIS — R112 Nausea with vomiting, unspecified: Secondary | ICD-10-CM | POA: Insufficient documentation

## 2011-06-20 DIAGNOSIS — K8020 Calculus of gallbladder without cholecystitis without obstruction: Secondary | ICD-10-CM | POA: Insufficient documentation

## 2011-06-20 LAB — DIFFERENTIAL
Basophils Absolute: 0.1 10*3/uL (ref 0.0–0.1)
Basophils Relative: 1 % (ref 0–1)
Eosinophils Absolute: 0.2 10*3/uL (ref 0.0–0.7)
Monocytes Relative: 6 % (ref 3–12)
Neutrophils Relative %: 61 % (ref 43–77)

## 2011-06-20 LAB — CBC
MCH: 30.9 pg (ref 26.0–34.0)
MCHC: 35.2 g/dL (ref 30.0–36.0)
Platelets: 214 10*3/uL (ref 150–400)
RDW: 12.1 % (ref 11.5–15.5)

## 2011-06-20 LAB — COMPREHENSIVE METABOLIC PANEL
AST: 21 U/L (ref 0–37)
Albumin: 3 g/dL — ABNORMAL LOW (ref 3.5–5.2)
Alkaline Phosphatase: 53 U/L (ref 39–117)
BUN: 15 mg/dL (ref 6–23)
Potassium: 4.1 mEq/L (ref 3.5–5.1)
Sodium: 141 mEq/L (ref 135–145)
Total Protein: 6 g/dL (ref 6.0–8.3)

## 2011-06-20 LAB — LIPASE, BLOOD: Lipase: 70 U/L — ABNORMAL HIGH (ref 11–59)

## 2011-06-20 LAB — URINALYSIS, ROUTINE W REFLEX MICROSCOPIC
Bilirubin Urine: NEGATIVE
Ketones, ur: NEGATIVE mg/dL
Leukocytes, UA: NEGATIVE
Nitrite: NEGATIVE
Urobilinogen, UA: 0.2 mg/dL (ref 0.0–1.0)

## 2011-06-20 MED ORDER — HYDROMORPHONE HCL PF 1 MG/ML IJ SOLN
1.0000 mg | Freq: Once | INTRAMUSCULAR | Status: AC
  Administered 2011-06-20: 1 mg via INTRAVENOUS
  Filled 2011-06-20: qty 1

## 2011-06-20 MED ORDER — SODIUM CHLORIDE 0.9 % IV SOLN
INTRAVENOUS | Status: DC
  Administered 2011-06-20: 06:00:00 via INTRAVENOUS

## 2011-06-20 MED ORDER — PROMETHAZINE HCL 25 MG PO TABS: 25.0000 mg | ORAL_TABLET | Freq: Four times a day (QID) | ORAL | Status: DC | PRN

## 2011-06-20 MED ORDER — ONDANSETRON HCL 4 MG/2ML IJ SOLN
4.0000 mg | INTRAMUSCULAR | Status: DC | PRN
  Administered 2011-06-20: 4 mg via INTRAVENOUS
  Filled 2011-06-20: qty 2

## 2011-06-20 MED ORDER — OXYCODONE-ACETAMINOPHEN 5-325 MG PO TABS: ORAL_TABLET | ORAL | Status: AC

## 2011-06-20 MED ORDER — METOCLOPRAMIDE HCL 5 MG/ML IJ SOLN
10.0000 mg | Freq: Once | INTRAMUSCULAR | Status: AC
  Administered 2011-06-20: 10 mg via INTRAVENOUS
  Filled 2011-06-20: qty 2

## 2011-06-20 MED ORDER — FENTANYL CITRATE 0.05 MG/ML IJ SOLN
50.0000 ug | INTRAMUSCULAR | Status: AC | PRN
  Administered 2011-06-20 (×2): 50 ug via INTRAVENOUS
  Filled 2011-06-20 (×2): qty 2

## 2011-06-20 NOTE — ED Notes (Signed)
When waste was completed in pyxis ed white rn wasted instead of . Pt received as charted in the mar

## 2011-06-20 NOTE — ED Notes (Addendum)
Woke up this morning around 0500 with RLQ abdomen pain, took percocet and started vomiting. SOB with pain because it hurt so bad. Just left the hosp couple day ago with the same problem.

## 2011-06-20 NOTE — Discharge Instructions (Signed)
Call [facility] to schedule close followup appointment with the Followup Clinic. Continue to use Percocet as needed for pain and Phenergan as needed for nausea. You may need an over-the-counter stool softener with Percocet use. Follow a completely fat-free diet. Return to emergency room for any changing or worsening symptoms as discussed.  Biliary Colic  Biliary colic is a steady or irregular pain in the upper abdomen. It is usually under the right side of the rib cage. It happens when gallstones interfere with the normal flow of bile from the gallbladder. Bile is a liquid that helps to digest fats. Bile is made in the liver and stored in the gallbladder. When you eat a meal, bile passes from the gallbladder through the cystic duct and the common bile duct into the small intestine. There, it mixes with partially digested food. If a gallstone blocks either of these ducts, the normal flow of bile is blocked. The muscle cells in the bile duct contract forcefully to try to move the stone. This causes the pain of biliary colic.  SYMPTOMS   A person with biliary colic usually complains of pain in the upper abdomen. This pain can be:   In the center of the upper abdomen just below the breastbone.   In the upper-right part of the abdomen, near the gallbladder and liver.   Spread back toward the right shoulder blade.   Nausea and vomiting.   The pain usually occurs after eating.   Biliary colic is usually triggered by the digestive system's demand for bile. The demand for bile is high after fatty meals. Symptoms can also occur when a person who has been fasting suddenly eats a very large meal. Most episodes of biliary colic pass after 1 to 5 hours. After the most intense pain passes, your abdomen may continue to ache mildly for about 24 hours.  DIAGNOSIS  After you describe your symptoms, your caregiver will perform a physical exam. He or she will pay attention to the upper right portion of  your belly (abdomen). This is the area of your liver and gallbladder. An ultrasound will help your caregiver look for gallstones. Specialized scans of the gallbladder may also be done. Blood tests may be done, especially if you have fever or if your pain persists. PREVENTION  Biliary colic can be prevented by controlling the risk factors for gallstones. Some of these risk factors, such as heredity, increasing age, and pregnancy are a normal part of life. Obesity and a high-fat diet are risk factors you can change through a healthy lifestyle. Women going through menopause who take hormone replacement therapy (estrogen) are also more likely to develop biliary colic. TREATMENT   Pain medication may be prescribed.   You may be encouraged to eat a fat-free diet.   If the first episode of biliary colic is severe, or episodes of colic keep retuning, surgery to remove the gallbladder (cholecystectomy) is usually recommended. This procedure can be done through small incisions using an instrument called a laparoscope. The procedure often requires a brief stay in the hospital. Some people can leave the hospital the same day. It is the most widely used treatment in people troubled by painful gallstones. It is effective and safe, with no complications in more than 90% of cases.   If surgery cannot be done, medication that dissolves gallstones may be used. This medication is expensive and can take months or years to work. Only small stones will dissolve.   Rarely, medication to dissolve  gallstones is combined with a procedure called shock-wave lithotripsy. This procedure uses carefully aimed shock waves to break up gallstones. In many people treated with this procedure, gallstones form again within a few years.  PROGNOSIS  If gallstones block your cystic duct or common bile duct, you are at risk for repeated episodes of biliary colic. There is also a 25% chance that you will develop a gallbladder infection(acute  cholecystitis), or some other complication of gallstones within 10 to 20 years. If you have surgery, schedule it at a time that is convenient for you and at a time when you are not sick. HOME CARE INSTRUCTIONS   Drink plenty of clear fluids.   Avoid fatty, greasy or fried foods, or any foods that make your pain worse.   Take medications as directed.  SEEK MEDICAL CARE IF:   You develop a fever over 100.5 F (38.1 C).   Your pain gets worse over time.   You develop nausea that prevents you from eating and drinking.   You develop vomiting.  SEEK IMMEDIATE MEDICAL CARE IF:   You have continuous or severe belly (abdominal) pain which is not relieved with medications.   You develop nausea and vomiting which is not relieved with medications.   You have symptoms of biliary colic and you suddenly develop a fever and shaking chills. This may signal cholecystitis. Call your caregiver immediately.   You develop a yellow color to your skin or the white part of your eyes (jaundice).  Document Released: 08/09/2005 Document Revised: 02/25/2011 Document Reviewed: 10/19/2007 [facility] Patient Information 2012 [place], [place].  Gallbladder Disease You have gallbladder disease. This means that there are stones and/or inflammation in your gallbladder and bile duct system. The symptoms of this disease are: heartburn, nausea, belching, vomiting, and intolerance to certain foods (fatty foods mainly). Exact diagnosis of this condition requires ultrasound or special x-ray examination. Gallbladder symptoms may improve with a proper low-fat diet and weight loss (if you are overweight). Medicines to relieve pain and reduce spasms in the bile duct may be quite helpful. Usually the diseased gallbladder needs to be removed by surgery.  SEEK IMMEDIATE MEDICAL CARE IF:  You have more severe or persistent pain that lasts for more than one day. The pain would most likely be in the upper right part of the abdomen.    You develop a fever, repeated vomiting, or jaundice (yellow skin and eyes).  Document Released: 04/15/2004 Document Revised: 11/18/2010 Document Reviewed: 01/24/2009 [facility] Patient Information 2012 [place], [place].

## 2011-06-20 NOTE — ED Provider Notes (Signed)
History     CSN: [id number]  Arrival date & time 06/20/11  0546   First MD Initiated Contact with Patient 06/20/11 [phone]      Chief Complaint  Patient presents with  . Abdominal Pain    (Consider location/radiation/quality/duration/timing/severity/associated sxs/prior treatment) HPI  Patient who states she has a history of gallstones that were diagnosed in September of 2012 by [place] but states that she's had no pain or complications with gallstones until 2 days ago when she was seen in ER for acute onset right upper quadrant pain returns to emergency department stating that she woke this morning with severe right upper quadrant pain and a bitter taste in her mouth. Patient states she took 2 Percocet for the pain but shortly after taking, vomited the pills back up. Patient states the pain was so severe that "it kept taking my breath away and that scared me." Patient states pain radiates from RUQ around into her right upper back. Patient states that 2 days ago labs were performed in ER which showed no abnormalities and therefore she was sent home for outpatient followup with general surgery which she states she planned on making however was unable to make the appointment since it is Lad[place] weekend. Patient states she has no history of abdominal surgeries. LMP Feb 5th which she states was a normal menstruation. Patient states pain is similar to the pain she had 2 days ago the pain she experienced in September when was diagnosed with gallstones. Denies known fevers and chills. She had nausea and vomiting but denies diarrhea. Denies aggravating or alleviating factors.  Past Medical History  Diagnosis Date  . Head ache   . Vertigo   . Back injury   . Gallstones     Past Surgical History  Procedure Date  . Hand tendon surgery   . Breast surgery   . Leep   . Tonsillectomy     No family history on file.  History  Substance Use Topics  . Smoking status: Never Smoker   . Smokeless tobacco:  Not on file  . Alcohol Use: No    OB History    Grav Para Term Preterm Abortions TAB SAB Ect Mult Living                  Review of Systems  All other systems reviewed and are negative.    Allergies  Review of patient's allergies indicates no known allergies.  Home Medications   Current Outpatient Rx  Name Route Sig Dispense Refill  . ACETAMINOPHEN 500 MG PO TABS Oral Take 500 mg by mouth every 6 (six) hours as needed. For headache pain    . ASPIRIN 325 MG PO TABS Oral Take 325 mg by mouth every 4 (four) hours as needed. For pain    . HYDROCODONE-ACETAMINOPHEN 5-325 MG PO TABS Oral Take 1 tablet by mouth every 6 (six) hours as needed. For pain    . IBUPROFEN 200 MG PO TABS Oral Take 400 mg by mouth every 6 (six) hours as needed. For pain    . KRILL OIL OMEGA-3 PO Oral Take 1 capsule by mouth daily.    . ADULT MULTIVITAMIN W/MINERALS CH Oral Take 1 tablet by mouth daily.    Marland Kitchen ONDANSETRON HCL 4 MG PO TABS Oral Take 1 tablet (4 mg total) by mouth every 6 (six) hours. 12 tablet 0  . OXYCODONE-ACETAMINOPHEN 5-325 MG PO TABS Oral Take 1 tablet by mouth every 6 (six) hours as needed for  pain. 12 tablet 0  . RANITIDINE HCL 150 MG PO TABS Oral Take 150 mg by mouth 2 (two) times daily.      BP 125/74  Pulse 71  Temp(Src) 98.3 F (36.8 C) (Oral)  Resp 18  SpO2 95%  LMP 05/25/2011  Physical Exam  Nursing note and vitals reviewed. Constitutional: She is oriented to person, place, and time. She appears well-developed and well-nourished. No distress.       Morbidly obese  HENT:  Head: Normocephalic and atraumatic.  Eyes: Conjunctivae are normal.  Neck: Normal range of motion. Neck supple.  Cardiovascular: Normal rate, regular rhythm, normal heart sounds and intact distal pulses.  Exam reveals no gallop and no friction rub.   No murmur heard. Pulmonary/Chest: Effort normal and breath sounds normal. No respiratory distress. She has no wheezes. She has no rales. She exhibits no  tenderness.  Abdominal: Soft. Bowel sounds are normal. She exhibits no distension and no mass. There is tenderness. There is no rebound and no guarding.       Morbidly obese abdomen.  TTP of RUQ but abdomen is soft without distention or rigidity.   Musculoskeletal: Normal range of motion. She exhibits no edema and no tenderness.  Neurological: She is alert and oriented to person, place, and time.  Skin: Skin is warm and dry. No rash noted. She is not diaphoretic. No erythema.  Psychiatric: She has a normal mood and affect.    ED Course  Procedures (including critical care time)  IV fentanyl and zofran, IV dilaudid and reglan  9:25 AM Patient is now pain free. Case discussed between Dr. Ignacia Palma and Dr. Lindie Spruce (gen surg) labs and imaging reviewed. Dr. Lindie Spruce advises close OP follow up in Follow Up Clinic and fat free diet. Patient is agreeable to this plan.   Labs Reviewed  URINALYSIS, ROUTINE W REFLEX MICROSCOPIC - Abnormal; Notable for the following:    APPearance CLOUDY (*)    All other components within normal limits  COMPREHENSIVE METABOLIC PANEL - Abnormal; Notable for the following:    Glucose, Bld 112 (*)    Albumin 3.0 (*)    Total Bilirubin 0.2 (*)    All other components within normal limits  LIPASE, BLOOD - Abnormal; Notable for the following:    Lipase 70 (*)    All other components within normal limits  CBC  DIFFERENTIAL  POCT PREGNANCY, URINE   US Abdomen Complete  06/20/2011  *RADIOLOGY REPORT*  Clinical Data:  Right upper abdominal pain.  Cholelithiasis.  COMPLETE ABDOMINAL ULTRASOUND  Comparison:  12/13/2010  Findings:  Gallbladder:  Multiple subcentimeter stones layer in the dependent aspect of the gallbladder.  There is mild gallbladder wall thickening up to 6 mm.  No pericholecystic fluid.  Sonographer reports no sonographic Murphy's sign.  Common bile duct:  5 mm diameter, unremarkable  Liver:  No focal lesion identified.  Within normal limits in parenchymal  echogenicity.  IVC:  Appears normal.  Pancreas:  No focal abnormality seen, portions of the tail obscured by overlying bowel gas.  Spleen:  10 cm craniocaudal length, unremarkable  Right Kidney:  11.5 cm. No hydronephrosis.  Well-preserved cortex. Normal size and parenchymal echotexture without focal abnormalities.  Left Kidney:  14 cm. No hydronephrosis.  Well-preserved cortex. Normal size and parenchymal echotexture without focal abnormalities.  Abdominal aorta:  No aneurysm identified.  IMPRESSION:  1.  Cholelithiasis with mild gallbladder wall thickening but no other ultrasound findings to suggest cholecystitis.  Original Report Authenticated By:  D. DANIEL HASSELL III, M.D.     1. Cholelithiasis   2. Biliary colic       MDM  Cholelithiasis without evidence of acute cholecystitis. She is afebrile with pain control. Case discussed with Dr. Lindie Spruce, general surgeon. He advises close followup as an outpatient followup clinic in a fat-free diet. Patient is agreeable to this plan. I spoke at length about worsening signs or symptoms that should prompt immediate return to the ER. She voices her understanding.        Jenness [place], [place] 06/20/11 0930

## 2011-06-20 NOTE — ED Provider Notes (Signed)
9:22 AM 46 yo woman returns for second time with RUQ pain, known cholelithiasis.  Exam shows mild RUQ tenderness.  Lab workup shows mildly elevated lipase.  Ultrasound shows gallstones, mild GB wall thickening.  Case discussed with Dr. Lindie Spruce, on call for general surgery, who advised low fat diet, pt to be seen in office at [facility].   Carleene Cooper III, MD 06/21/11 1013

## 2011-06-20 NOTE — ED Notes (Signed)
Patient transported to Ultrasound 

## 2011-06-20 NOTE — ED Notes (Signed)
PT. REPORTS RUQ PAIN RADIATING TO RIGHT BACK WITH VOMITTING ONSET THIS MORNING , STATES " BITTER TASTE" , DENIES DIARRHEA /FEVER OR CHILLS.

## 2011-06-20 NOTE — ED Notes (Signed)
Pt states that she is having a headache. meds given. md at the bedside.

## 2011-06-21 ENCOUNTER — Ambulatory Visit (INDEPENDENT_AMBULATORY_CARE_PROVIDER_SITE_OTHER): Payer: Self-pay | Admitting: General Surgery

## 2011-06-21 ENCOUNTER — Emergency Department (INDEPENDENT_AMBULATORY_CARE_PROVIDER_SITE_OTHER)
Admission: EM | Admit: 2011-06-21 | Discharge: 2011-06-21 | Disposition: A | Discharge status: Home / Self Care | Payer: Self-pay | Source: Home / Self Care | Attending: Emergency Medicine | Admitting: Emergency Medicine

## 2011-06-21 ENCOUNTER — Encounter (INDEPENDENT_AMBULATORY_CARE_PROVIDER_SITE_OTHER): Payer: Self-pay | Admitting: General Surgery

## 2011-06-21 ENCOUNTER — Encounter (HOSPITAL_COMMUNITY): Payer: Self-pay | Admitting: *Deleted

## 2011-06-21 DIAGNOSIS — R8290 Unspecified abnormal findings in urine: Secondary | ICD-10-CM

## 2011-06-21 DIAGNOSIS — R82998 Other abnormal findings in urine: Secondary | ICD-10-CM

## 2011-06-21 DIAGNOSIS — K8020 Calculus of gallbladder without cholecystitis without obstruction: Secondary | ICD-10-CM

## 2011-06-21 HISTORY — DX: Other chronic pain: G89.29

## 2011-06-21 HISTORY — DX: Dorsalgia, unspecified: M54.9

## 2011-06-21 LAB — POCT URINALYSIS DIP (DEVICE)
Bilirubin Urine: NEGATIVE
Ketones, ur: NEGATIVE mg/dL
Leukocytes, UA: NEGATIVE
pH: 5 (ref 5.0–8.0)

## 2011-06-21 NOTE — ED Provider Notes (Signed)
Medical screening examination/treatment/procedure(s) were performed by non-physician practitioner and as supervising physician I was immediately available for consultation/collaboration.   Alethea Terhaar M Esten Dollar, DO 06/21/11 0646 

## 2011-06-21 NOTE — ED Notes (Signed)
Has had 3 episodes hematuria today without any dysuria, urinary frequency or urgency.  Was in ED Fri w/ gallstones "and had clear urine then"; had gallbladder US Sunday and gave urine sample then ("wasn't told results").

## 2011-06-21 NOTE — Discharge Instructions (Signed)
As discussed your urine did not reveal any signs of infection or blood. The pigmentation changes or possibly related to term attempt, once of bilirubin in your urine change in his pigmentation. Followup as planned with surgeon for elective cholecystectomy

## 2011-06-21 NOTE — Progress Notes (Signed)
Subjective:     Patient ID: Wanda Mcintosh, female   DOB: 08/01/1965, 45 y.o.   MRN: [id number]  HPI We are asked to see the patient in consultation by Dr. Hunt to evaluate her for gallstones. The patient is a 45-year-old white female who first had an episode of right upper quadrant pain in September. She modified her diet and pain away. She recently has had 2 more episodes of upper abdominal pain with nausea and vomiting. She went to the emergency department and had an ultrasound done which did show stones in her gallbladder. Her liver function tests were normal.  Review of Systems  Constitutional: Negative.   HENT: Negative.   Eyes: Negative.   Respiratory: Negative.   Cardiovascular: Negative.   Gastrointestinal: Positive for abdominal pain.  Genitourinary: Negative.   Musculoskeletal: Negative.   Skin: Negative.   Neurological: Negative.   Hematological: Negative.   Psychiatric/Behavioral: Negative.        Objective:   Physical Exam  Constitutional: She is oriented to person, place, and time. She appears well-developed and well-nourished.  HENT:  Head: Normocephalic and atraumatic.  Eyes: Conjunctivae and EOM are normal. Pupils are equal, round, and reactive to light.  Neck: Normal range of motion. Neck supple.  Cardiovascular: Normal rate, regular rhythm and normal heart sounds.   Pulmonary/Chest: Effort normal and breath sounds normal.  Abdominal: Soft. Bowel sounds are normal. There is tenderness.  Musculoskeletal: Normal range of motion.  Neurological: She is alert and oriented to person, place, and time.  Skin: Skin is warm and dry.  Psychiatric: She has a normal mood and affect. Her behavior is normal.       Assessment:     Symptomatic gallstones    Plan:     Because of the risk of further painful episodes and possible pancreatitis I think she would benefit from having her gallbladder removed. She would also like to have this done. I have discussed with her in  detail the risks and benefits of the operation to remove the gallbladder as well some of the technical aspects and she understands and wishes to proceed.      

## 2011-06-22 ENCOUNTER — Encounter (HOSPITAL_COMMUNITY): Payer: Self-pay | Admitting: Emergency Medicine

## 2011-06-22 NOTE — ED Provider Notes (Signed)
History     CSN: [id number]  Arrival date & time 06/21/11  1928   First MD Initiated Contact with Patient 06/21/11 1951      Chief Complaint  Patient presents with  . Hematuria    (Consider location/radiation/quality/duration/timing/severity/associated sxs/prior treatment) HPI Comments: Patient presents urgent care tonight as she has noticed to 3 episodes of blood in your (dark looking your) of blood clots patient denies any pain with urination, denies any increase frequency urgency. Have seen the surgical consult today an elective surgery for cough or still has been scheduled.  At this point patient denies any abdominal pain nausea vomiting fevers.  Patient is a 46 y.o. female presenting with hematuria. The history is provided by the patient.  Hematuria This is a new problem. The current episode started today. She describes the hematuria as gross hematuria. The hematuria occurs during the initial portion of her urinary stream. She is experiencing no pain. Irritative symptoms do not include frequency. Pertinent negatives include no chills, dysuria, fever, flank pain, nausea or vomiting.    Past Medical History  Diagnosis Date  . Head ache   . Vertigo   . Back injury   . Gallstones   . Chronic back pain   . Migraine     Past Surgical History  Procedure Date  . Hand tendon surgery   . Breast surgery   . Leep   . Tonsillectomy     History reviewed. No pertinent family history.  History  Substance Use Topics  . Smoking status: Never Smoker   . Smokeless tobacco: Not on file  . Alcohol Use: No    OB History    Grav Para Term Preterm Abortions TAB SAB Ect Mult Living                  Review of Systems  Constitutional: Negative for fever, chills and appetite change.  Respiratory: Negative for shortness of breath.   Gastrointestinal: Negative for nausea and vomiting.  Genitourinary: Positive for hematuria. Negative for dysuria, frequency and flank pain.     Allergies  Zofran  Home Medications   Current Outpatient Rx  Name Route Sig Dispense Refill  . ACETAMINOPHEN 500 MG PO TABS Oral Take 500 mg by mouth every 6 (six) hours as needed. For headache pain    . ASPIRIN 325 MG PO TABS Oral Take 325 mg by mouth every 4 (four) hours as needed. For pain    . IBUPROFEN 200 MG PO TABS Oral Take 400 mg by mouth every 6 (six) hours as needed. For pain    . MECLIZINE HCL PO Oral Take by mouth.    . OXYCODONE-ACETAMINOPHEN 5-325 MG PO TABS Oral Take 1 tablet by mouth every 6 (six) hours as needed for pain. 12 tablet 0  . OXYCODONE-ACETAMINOPHEN 5-325 MG PO TABS  Take 1-2 tabs every 4 hours as needed for pain. 10 tablet 0  . PROMETHAZINE HCL 25 MG PO TABS Oral Take 1 tablet (25 mg total) by mouth every 6 (six) hours as needed for nausea. 20 tablet 0  . HYDROCODONE-ACETAMINOPHEN 5-325 MG PO TABS Oral Take 1 tablet by mouth every 6 (six) hours as needed. For pain    . KRILL OIL OMEGA-3 PO Oral Take 1 capsule by mouth daily.    . ADULT MULTIVITAMIN W/MINERALS CH Oral Take 1 tablet by mouth daily.    Marland Kitchen RANITIDINE HCL 150 MG PO TABS Oral Take 150 mg by mouth 2 (two) times daily.  BP 132/85  Pulse 76  Temp(Src) 98.5 F (36.9 C) (Oral)  Resp 20  SpO2 97%  LMP 05/25/2011  Physical Exam  Constitutional: She appears well-developed and well-nourished. No distress.  Abdominal: Soft. There is no tenderness. There is no rebound and no guarding.  Neurological: She is alert.    ED Course  Procedures (including critical care time)   Labs Reviewed  POCT URINALYSIS DIP (DEVICE)  LAB REPORT - SCANNED   US Abdomen Complete  06/20/2011  *RADIOLOGY REPORT*  Clinical Data:  Right upper abdominal pain.  Cholelithiasis.  COMPLETE ABDOMINAL ULTRASOUND  Comparison:  12/13/2010  Findings:  Gallbladder:  Multiple subcentimeter stones layer in the dependent aspect of the gallbladder.  There is mild gallbladder wall thickening up to 6 mm.  No pericholecystic  fluid.  Sonographer reports no sonographic Murphy's sign.  Common bile duct:  5 mm diameter, unremarkable  Liver:  No focal lesion identified.  Within normal limits in parenchymal echogenicity.  IVC:  Appears normal.  Pancreas:  No focal abnormality seen, portions of the tail obscured by overlying bowel gas.  Spleen:  10 cm craniocaudal length, unremarkable  Right Kidney:  11.5 cm. No hydronephrosis.  Well-preserved cortex. Normal size and parenchymal echotexture without focal abnormalities.  Left Kidney:  14 cm. No hydronephrosis.  Well-preserved cortex. Normal size and parenchymal echotexture without focal abnormalities.  Abdominal aorta:  No aneurysm identified.  IMPRESSION:  1.  Cholelithiasis with mild gallbladder wall thickening but no other ultrasound findings to suggest cholecystitis.  Original Report Authenticated By: Thora Lance III, M.D.     1. Urine abnormality       MDM  Urinalysis today was negative for hematuria. Suspect patient be experiencing episodes of  hyperbillirrubinuria.        Jimmie Molly, MD 06/22/11 [phone]

## 2011-06-24 ENCOUNTER — Encounter (HOSPITAL_COMMUNITY): Payer: Self-pay | Admitting: *Deleted

## 2011-07-02 ENCOUNTER — Encounter (HOSPITAL_COMMUNITY): Payer: Self-pay | Admitting: Pharmacy Technician

## 2011-07-02 ENCOUNTER — Telehealth (INDEPENDENT_AMBULATORY_CARE_PROVIDER_SITE_OTHER): Payer: Self-pay | Admitting: General Surgery

## 2011-07-02 NOTE — Telephone Encounter (Signed)
Pt had question about pain meds.  She is out of pain meds (issued at ER) and wanted to know what to do if she needs more.  I suggested Tylenol for pain, as the surgeon will not dispense pain meds until after surgery.  If pain is severe enough contact PCP or return to ER.  She understands.

## 2011-07-09 ENCOUNTER — Encounter (HOSPITAL_COMMUNITY): Payer: Self-pay

## 2011-07-09 ENCOUNTER — Encounter (HOSPITAL_COMMUNITY)
Admission: RE | Admit: 2011-07-09 | Discharge: 2011-07-09 | Disposition: A | Discharge status: Home / Self Care | Payer: Self-pay | Source: Ambulatory Visit | Attending: General Surgery | Admitting: General Surgery

## 2011-07-09 HISTORY — DX: Other complications of anesthesia, initial encounter: T88.59XA

## 2011-07-09 HISTORY — DX: Gastro-esophageal reflux disease without esophagitis: K21.9

## 2011-07-09 HISTORY — DX: Malignant (primary) neoplasm, unspecified: C80.1

## 2011-07-09 HISTORY — DX: Adverse effect of unspecified anesthetic, initial encounter: T41.45XA

## 2011-07-09 LAB — CBC
Platelets: 250 10*3/uL (ref 150–400)
RDW: 12 % (ref 11.5–15.5)
WBC: 7.4 10*3/uL (ref 4.0–10.5)

## 2011-07-09 LAB — HCG, SERUM, QUALITATIVE: Preg, Serum: NEGATIVE

## 2011-07-09 LAB — SURGICAL PCR SCREEN: Staphylococcus aureus: NEGATIVE

## 2011-07-09 MED ORDER — CHLORHEXIDINE GLUCONATE 4 % EX LIQD: 1.0000 "application " | Freq: Once | CUTANEOUS | Status: DC

## 2011-07-09 NOTE — Pre-Procedure Instructions (Addendum)
[address] [place]  07/09/2011   Your procedure is scheduled on: 4.26.13  Report to [facility] at 800 AM.  Call this number if you have problems the morning of surgery: [phone]   Remember:   Do not eat food:After Midnight.  May have clear liquids: up to 4 Hours before arrival. 400 am  Clear liquids include soda, tea, black coffee, apple or grape juice, broth.  Take these medicines the morning of surgery with A SIP OF WATER: pain med zantac  STOP   Krill oil, multi vit,any aspirin, nsaids, herbal meds, blood thinners   Do not wear jewelry, make-up or nail polish.  Do not wear lotions, powders, or perfumes. You may wear deodorant.  Do not shave 48 hours prior to surgery.  Do not bring valuables to the hospital.  Contacts, dentures or bridgework may not be worn into surgery.  Leave suitcase in the car. After surgery it may be brought to your room.  For patients admitted to the hospital, checkout time is 11:00 AM the day of discharge.   Patients discharged the day of surgery will not be allowed to drive home.  Name and phone number of your driver: Wanda Mcintosh [phone]  Special Instructions: CHG Shower Use Special Wash: 1/2 bottle night before surgery and 1/2 bottle morning of surgery.   Please read over the following fact sheets that you were given: Pain Booklet, Coughing and Deep Breathing, MRSA Information and Surgical Site Infection Prevention

## 2011-07-15 MED ORDER — CEFAZOLIN SODIUM-DEXTROSE 2-3 GM-% IV SOLR
2.0000 g | INTRAVENOUS | Status: AC
  Administered 2011-07-16: 2 g via INTRAVENOUS
  Filled 2011-07-15: qty 50

## 2011-07-16 ENCOUNTER — Encounter (HOSPITAL_COMMUNITY)
Admission: RE | Disposition: A | Discharge status: Home / Self Care | Payer: Self-pay | Source: Ambulatory Visit | Attending: General Surgery

## 2011-07-16 ENCOUNTER — Ambulatory Visit (HOSPITAL_COMMUNITY): Payer: Self-pay

## 2011-07-16 ENCOUNTER — Encounter (HOSPITAL_COMMUNITY): Payer: Self-pay | Admitting: *Deleted

## 2011-07-16 ENCOUNTER — Encounter (HOSPITAL_COMMUNITY): Payer: Self-pay | Admitting: Anesthesiology

## 2011-07-16 ENCOUNTER — Ambulatory Visit (HOSPITAL_COMMUNITY)
Admission: RE | Admit: 2011-07-16 | Discharge: 2011-07-16 | Disposition: A | Discharge status: Home / Self Care | Payer: MEDICAID | Source: Ambulatory Visit | Attending: General Surgery | Admitting: General Surgery

## 2011-07-16 ENCOUNTER — Ambulatory Visit (HOSPITAL_COMMUNITY): Payer: Self-pay | Admitting: Anesthesiology

## 2011-07-16 DIAGNOSIS — K8020 Calculus of gallbladder without cholecystitis without obstruction: Secondary | ICD-10-CM | POA: Insufficient documentation

## 2011-07-16 DIAGNOSIS — Z01812 Encounter for preprocedural laboratory examination: Secondary | ICD-10-CM | POA: Insufficient documentation

## 2011-07-16 DIAGNOSIS — K824 Cholesterolosis of gallbladder: Secondary | ICD-10-CM

## 2011-07-16 DIAGNOSIS — R51 Headache: Secondary | ICD-10-CM | POA: Insufficient documentation

## 2011-07-16 DIAGNOSIS — K219 Gastro-esophageal reflux disease without esophagitis: Secondary | ICD-10-CM | POA: Insufficient documentation

## 2011-07-16 DIAGNOSIS — K8010 Calculus of gallbladder with chronic cholecystitis without obstruction: Secondary | ICD-10-CM

## 2011-07-16 HISTORY — PX: CHOLECYSTECTOMY: SHX55

## 2011-07-16 SURGERY — LAPAROSCOPIC CHOLECYSTECTOMY WITH INTRAOPERATIVE CHOLANGIOGRAM
Anesthesia: General | Wound class: Clean

## 2011-07-16 MED ORDER — SODIUM CHLORIDE 0.9 % IV SOLN: 250.0000 mL | INTRAVENOUS | Status: DC | PRN

## 2011-07-16 MED ORDER — PROMETHAZINE HCL 25 MG PO TABS: 25.0000 mg | ORAL_TABLET | Freq: Four times a day (QID) | ORAL | Status: DC | PRN

## 2011-07-16 MED ORDER — SODIUM CHLORIDE 0.9 % IV SOLN
INTRAVENOUS | Status: DC | PRN
  Administered 2011-07-16: 11:00:00

## 2011-07-16 MED ORDER — OXYCODONE-ACETAMINOPHEN 5-325 MG PO TABS: 1.0000 | ORAL_TABLET | ORAL | Status: DC | PRN

## 2011-07-16 MED ORDER — SODIUM CHLORIDE 0.9 % IJ SOLN
3.0000 mL | Freq: Two times a day (BID) | INTRAMUSCULAR | Status: DC
Start: 2011-07-16 — End: 2011-07-16

## 2011-07-16 MED ORDER — FENTANYL CITRATE 0.05 MG/ML IJ SOLN
INTRAMUSCULAR | Status: DC | PRN
  Administered 2011-07-16: 50 ug via INTRAVENOUS
  Administered 2011-07-16: 150 ug via INTRAVENOUS
  Administered 2011-07-16: 50 ug via INTRAVENOUS

## 2011-07-16 MED ORDER — OXYCODONE HCL 5 MG PO TABS
5.0000 mg | ORAL_TABLET | ORAL | Status: DC | PRN
  Administered 2011-07-16: 10 mg via ORAL

## 2011-07-16 MED ORDER — ACETAMINOPHEN 325 MG PO TABS: 650.0000 mg | ORAL_TABLET | ORAL | Status: DC | PRN

## 2011-07-16 MED ORDER — BUPIVACAINE-EPINEPHRINE 0.25% -1:200000 IJ SOLN
INTRAMUSCULAR | Status: DC | PRN
  Administered 2011-07-16: 26 mL

## 2011-07-16 MED ORDER — MORPHINE SULFATE 4 MG/ML IJ SOLN: 4.0000 mg | INTRAMUSCULAR | Status: DC | PRN

## 2011-07-16 MED ORDER — HYDROMORPHONE HCL PF 1 MG/ML IJ SOLN
0.2500 mg | INTRAMUSCULAR | Status: DC | PRN
  Administered 2011-07-16 (×2): 0.5 mg via INTRAVENOUS

## 2011-07-16 MED ORDER — OXYCODONE-ACETAMINOPHEN 5-325 MG PO TABS: 1.0000 | ORAL_TABLET | ORAL | Status: AC | PRN

## 2011-07-16 MED ORDER — ACETAMINOPHEN 650 MG RE SUPP: 650.0000 mg | RECTAL | Status: DC | PRN

## 2011-07-16 MED ORDER — PROMETHAZINE HCL 25 MG/ML IJ SOLN: 6.2500 mg | INTRAMUSCULAR | Status: DC | PRN

## 2011-07-16 MED ORDER — DEXAMETHASONE SODIUM PHOSPHATE 10 MG/ML IJ SOLN
INTRAMUSCULAR | Status: DC | PRN
  Administered 2011-07-16: 4 mg via INTRAVENOUS

## 2011-07-16 MED ORDER — SODIUM CHLORIDE 0.9 % IJ SOLN: 3.0000 mL | INTRAMUSCULAR | Status: DC | PRN

## 2011-07-16 MED ORDER — SODIUM CHLORIDE 0.9 % IR SOLN
Status: DC | PRN
  Administered 2011-07-16: 1000 mL

## 2011-07-16 MED ORDER — MIDAZOLAM HCL 5 MG/5ML IJ SOLN
INTRAMUSCULAR | Status: DC | PRN
  Administered 2011-07-16: 2 mg via INTRAVENOUS

## 2011-07-16 MED ORDER — FAMOTIDINE 20 MG PO TABS: 20.0000 mg | ORAL_TABLET | Freq: Every day | ORAL | Status: DC

## 2011-07-16 MED ORDER — ROCURONIUM BROMIDE 100 MG/10ML IV SOLN
INTRAVENOUS | Status: DC | PRN
  Administered 2011-07-16: 50 mg via INTRAVENOUS
  Administered 2011-07-16: 10 mg via INTRAVENOUS

## 2011-07-16 MED ORDER — LIDOCAINE HCL (CARDIAC) 20 MG/ML IV SOLN
INTRAVENOUS | Status: DC | PRN
  Administered 2011-07-16: 60 mg via INTRAVENOUS

## 2011-07-16 MED ORDER — LACTATED RINGERS IV SOLN
INTRAVENOUS | Status: DC | PRN
  Administered 2011-07-16 (×2): via INTRAVENOUS

## 2011-07-16 MED ORDER — GLYCOPYRROLATE 0.2 MG/ML IJ SOLN
INTRAMUSCULAR | Status: DC | PRN
  Administered 2011-07-16: .8 mg via INTRAVENOUS

## 2011-07-16 MED ORDER — DROPERIDOL 2.5 MG/ML IJ SOLN
INTRAMUSCULAR | Status: DC | PRN
  Administered 2011-07-16: 0.625 mg via INTRAVENOUS

## 2011-07-16 MED ORDER — ONDANSETRON HCL 4 MG/2ML IJ SOLN: 4.0000 mg | Freq: Four times a day (QID) | INTRAMUSCULAR | Status: DC | PRN

## 2011-07-16 MED ORDER — HYDROMORPHONE HCL PF 1 MG/ML IJ SOLN
INTRAMUSCULAR | Status: AC
  Filled 2011-07-16: qty 1

## 2011-07-16 MED ORDER — PROPOFOL 10 MG/ML IV EMUL
INTRAVENOUS | Status: DC | PRN
  Administered 2011-07-16: 200 mg via INTRAVENOUS

## 2011-07-16 MED ORDER — OXYCODONE HCL 5 MG PO TABS
ORAL_TABLET | ORAL | Status: AC
  Filled 2011-07-16: qty 2

## 2011-07-16 MED ORDER — KCL IN DEXTROSE-NACL 20-5-0.9 MEQ/L-%-% IV SOLN: INTRAVENOUS | Status: DC

## 2011-07-16 MED ORDER — NEOSTIGMINE METHYLSULFATE 1 MG/ML IJ SOLN
INTRAMUSCULAR | Status: DC | PRN
  Administered 2011-07-16: 5 mg via INTRAVENOUS

## 2011-07-16 SURGICAL SUPPLY — 44 items
APPLIER CLIP ROT 10 11.4 M/L (STAPLE) ×2
BLADE SURG ROTATE 9660 (MISCELLANEOUS) IMPLANT
CANISTER SUCTION 2500CC (MISCELLANEOUS) ×2 IMPLANT
CATH REDDICK CHOLANGI 4FR 50CM (CATHETERS) ×2 IMPLANT
CHLORAPREP W/TINT 26ML (MISCELLANEOUS) ×2 IMPLANT
CLIP APPLIE ROT 10 11.4 M/L (STAPLE) ×1 IMPLANT
CLOTH BEACON ORANGE TIMEOUT ST (SAFETY) ×2 IMPLANT
COVER MAYO STAND STRL (DRAPES) ×2 IMPLANT
COVER SURGICAL LIGHT HANDLE (MISCELLANEOUS) ×2 IMPLANT
DECANTER SPIKE VIAL GLASS SM (MISCELLANEOUS) ×4 IMPLANT
DERMABOND ADVANCED (GAUZE/BANDAGES/DRESSINGS) ×1
DERMABOND ADVANCED .7 DNX12 (GAUZE/BANDAGES/DRESSINGS) ×1 IMPLANT
DRAPE C-ARM 42X72 X-RAY (DRAPES) ×2 IMPLANT
DRAPE UTILITY 15X26 W/TAPE STR (DRAPE) ×4 IMPLANT
ELECT REM PT RETURN 9FT ADLT (ELECTROSURGICAL) ×2
ELECTRODE REM PT RTRN 9FT ADLT (ELECTROSURGICAL) ×1 IMPLANT
GLOVE BIO SURGEON STRL SZ7.5 (GLOVE) ×2 IMPLANT
GLOVE BIOGEL PI IND STRL 6.5 (GLOVE) ×1 IMPLANT
GLOVE BIOGEL PI IND STRL 7.5 (GLOVE) ×1 IMPLANT
GLOVE BIOGEL PI INDICATOR 6.5 (GLOVE) ×1
GLOVE BIOGEL PI INDICATOR 7.5 (GLOVE) ×1
GLOVE SURG SIGNA 7.5 PF LTX (GLOVE) ×2 IMPLANT
GLOVE SURG SS PI 6.5 STRL IVOR (GLOVE) ×2 IMPLANT
GLOVE SURG SS PI 7.5 STRL IVOR (GLOVE) ×2 IMPLANT
GOWN SRG XL XLNG 56XLVL 4 (GOWN DISPOSABLE) ×1 IMPLANT
GOWN STRL NON-REIN LRG LVL3 (GOWN DISPOSABLE) ×8 IMPLANT
GOWN STRL NON-REIN XL XLG LVL4 (GOWN DISPOSABLE) ×1
IV CATH 14GX2 1/4 (CATHETERS) ×2 IMPLANT
KIT BASIN OR (CUSTOM PROCEDURE TRAY) ×2 IMPLANT
KIT ROOM TURNOVER OR (KITS) ×2 IMPLANT
NS IRRIG 1000ML POUR BTL (IV SOLUTION) ×2 IMPLANT
PAD ARMBOARD 7.5X6 YLW CONV (MISCELLANEOUS) ×2 IMPLANT
POUCH SPECIMEN RETRIEVAL 10MM (ENDOMECHANICALS) ×2 IMPLANT
SCISSORS LAP 5X35 DISP (ENDOMECHANICALS) IMPLANT
SET IRRIG TUBING LAPAROSCOPIC (IRRIGATION / IRRIGATOR) ×2 IMPLANT
SLEEVE ENDOPATH XCEL 5M (ENDOMECHANICALS) ×2 IMPLANT
SPECIMEN JAR SMALL (MISCELLANEOUS) ×2 IMPLANT
SUT MNCRL AB 4-0 PS2 18 (SUTURE) ×2 IMPLANT
TOWEL OR 17X24 6PK STRL BLUE (TOWEL DISPOSABLE) ×2 IMPLANT
TOWEL OR 17X26 10 PK STRL BLUE (TOWEL DISPOSABLE) ×2 IMPLANT
TRAY LAPAROSCOPIC (CUSTOM PROCEDURE TRAY) ×2 IMPLANT
TROCAR XCEL BLUNT TIP 100MML (ENDOMECHANICALS) ×2 IMPLANT
TROCAR XCEL NON-BLD 11X100MML (ENDOMECHANICALS) ×2 IMPLANT
TROCAR XCEL NON-BLD 5MMX100MML (ENDOMECHANICALS) ×2 IMPLANT

## 2011-07-16 NOTE — Transfer of Care (Signed)
Immediate Anesthesia Transfer of Care Note  Patient: Wanda Mcintosh  Procedure(s) Performed: Procedure(s) (LRB): LAPAROSCOPIC CHOLECYSTECTOMY WITH INTRAOPERATIVE CHOLANGIOGRAM (N/A)  Patient Location: PACU  Anesthesia Type: General  Level of Consciousness: awake  Airway & Oxygen Therapy: Patient Spontanous Breathing and Patient connected to face mask oxygen  Post-op Assessment: Report given to PACU RN  Post vital signs: Reviewed  Complications: No apparent anesthesia complications

## 2011-07-16 NOTE — H&P (View-Only) (Signed)
Subjective:     Patient ID: Wanda Mcintosh, female   DOB: 28-May-1965, 46 y.o.   MRN: [id number]  HPI We are asked to see the patient in consultation by Dr. Alto Denver to evaluate her for gallstones. The patient is a 46 year old white female who first had an episode of right upper quadrant pain in September. She modified her diet and pain away. She recently has had 2 more episodes of upper abdominal pain with nausea and vomiting. She went to the emergency department and had an ultrasound done which did show stones in her gallbladder. Her liver function tests were normal.  Review of Systems  Constitutional: Negative.   HENT: Negative.   Eyes: Negative.   Respiratory: Negative.   Cardiovascular: Negative.   Gastrointestinal: Positive for abdominal pain.  Genitourinary: Negative.   Musculoskeletal: Negative.   Skin: Negative.   Neurological: Negative.   Hematological: Negative.   Psychiatric/Behavioral: Negative.        Objective:   Physical Exam  Constitutional: She is oriented to person, place, and time. She appears well-developed and well-nourished.  HENT:  Head: Normocephalic and atraumatic.  Eyes: Conjunctivae and EOM are normal. Pupils are equal, round, and reactive to light.  Neck: Normal range of motion. Neck supple.  Cardiovascular: Normal rate, regular rhythm and normal heart sounds.   Pulmonary/Chest: Effort normal and breath sounds normal.  Abdominal: Soft. Bowel sounds are normal. There is tenderness.  Musculoskeletal: Normal range of motion.  Neurological: She is alert and oriented to person, place, and time.  Skin: Skin is warm and dry.  Psychiatric: She has a normal mood and affect. Her behavior is normal.       Assessment:     Symptomatic gallstones    Plan:     Because of the risk of further painful episodes and possible pancreatitis I think she would benefit from having her gallbladder removed. She would also like to have this done. I have discussed with her in  detail the risks and benefits of the operation to remove the gallbladder as well some of the technical aspects and she understands and wishes to proceed.

## 2011-07-16 NOTE — Anesthesia Preprocedure Evaluation (Addendum)
Anesthesia Evaluation  Patient identified by MRN, date of birth, ID band Patient awake    Reviewed: Allergy & Precautions, H&P , NPO status , Patient's Chart, lab work & pertinent test results  Airway Mallampati: II TM Distance: >3 FB Neck ROM: Full    Dental No notable dental hx.    Pulmonary neg pulmonary ROS,  breath sounds clear to auscultation  Pulmonary exam normal       Cardiovascular negative cardio ROS  Rhythm:Regular Rate:Normal     Neuro/Psych  Headaches, negative psych ROS   GI/Hepatic Neg liver ROS, GERD-  Medicated,  Endo/Other  negative endocrine ROS  Renal/GU negative Renal ROS  negative genitourinary   Musculoskeletal negative musculoskeletal ROS (+)   Abdominal   Peds negative pediatric ROS (+)  Hematology negative hematology ROS (+)   Anesthesia Other Findings   Reproductive/Obstetrics negative OB ROS                           Anesthesia Physical Anesthesia Plan  ASA: II  Anesthesia Plan: General   Post-op Pain Management:    Induction: Intravenous  Airway Management Planned: Oral ETT  Additional Equipment:   Intra-op Plan:   Post-operative Plan: Extubation in OR  Informed Consent: I have reviewed the patients History and Physical, chart, labs and discussed the procedure including the risks, benefits and alternatives for the proposed anesthesia with the patient or authorized representative who has indicated his/her understanding and acceptance.   Dental advisory given  Plan Discussed with: CRNA  Anesthesia Plan Comments: (Note zofran allergy)       Anesthesia Quick Evaluation

## 2011-07-16 NOTE — Discharge Instructions (Signed)
Laparoscopic Cholecystectomy Care After Refer to this sheet in the next few weeks. These instructions provide you with information on caring for yourself after your procedure. Your caregiver may also give you more specific instructions. Your treatment has been planned according to current medical practices, but problems sometimes occur. Call your caregiver if you have any problems or questions after your procedure. HOME CARE INSTRUCTIONS   Change bandages (dressings) as directed by your caregiver.   Keep the wound dry and clean. The wound may be washed gently with soap and water. Gently blot or dab the area dry.   Do not take baths or use swimming pools or hot tubs for 10 days, or as instructed by your caregiver.   Only take over-the-counter or prescription medicines for pain, discomfort, or fever as directed by your caregiver.   Continue your normal diet as directed by your caregiver.   Do not lift anything heavier than 25 pounds (11.5 kg), or as directed by your caregiver.   Do not play contact sports for 1 week, or as directed by your caregiver.  SEEK MEDICAL CARE IF:   There is redness, swelling, or increasing pain in the wound.   You notice yellowish-white fluid (pus) coming from the wound.   There is drainage from the wound that lasts longer than 1 day.   There is a bad smell coming from the wound or dressing.   The surgical cut (incision) breaks open.  SEEK IMMEDIATE MEDICAL CARE IF:   You develop a rash.   You have difficulty breathing.   You develop chest pain.   You develop any reaction or side effects to medicines given.   You have a fever.   You have increasing pain in the shoulders (shoulder strap areas).   You have dizzy episodes or faint while standing.   You develop severe abdominal pain.   You feel sick to your stomach (nauseous) or throw up (vomit) and this lasts for more than 1 day.  MAKE SURE YOU:   Understand these instructions.   Will watch  your condition.   Will get help right away if you are not doing well or get worse.  Document Released: 03/08/2005 Document Revised: 02/25/2011 Document Reviewed: 08/21/2010 Exit[employer] Patient Information 2012 [employer]. 

## 2011-07-16 NOTE — Op Note (Signed)
07/16/2011  11:08 AM  PATIENT:  Wanda Mcintosh  46 y.o. female  PRE-OPERATIVE DIAGNOSIS:  gallstones  POST-OPERATIVE DIAGNOSIS:  Gallstones  PROCEDURE:  Procedure(s) (LRB): LAPAROSCOPIC CHOLECYSTECTOMY WITH INTRAOPERATIVE CHOLANGIOGRAM (N/A)  SURGEON:  Surgeon(s) and Role:    * Robyne Askew, MD - Primary  PHYSICIAN ASSISTANT:   ASSISTANTS: Dr. Ezzard Standing   ANESTHESIA:   general  EBL:  Total I/O In: 1000 [I.V.:1000] Out: -   BLOOD ADMINISTERED:none  DRAINS: none   LOCAL MEDICATIONS USED:  MARCAINE     SPECIMEN:  Source of Specimen:  gallbladder  DISPOSITION OF SPECIMEN:  PATHOLOGY  COUNTS:  YES  TOURNIQUET:  * No tourniquets in log *  DICTATION: .Dragon Dictation  PLAN OF CARE: Admit for overnight observation  PATIENT DISPOSITION:  PACU - hemodynamically stable.   Delay start of Pharmacological VTE agent (>24hrs) due to surgical blood loss or risk of bleeding: yes

## 2011-07-16 NOTE — Anesthesia Postprocedure Evaluation (Signed)
  Anesthesia Post-op Note  Patient: [name]  Procedure(s) Performed: Procedure(s) (LRB): LAPAROSCOPIC CHOLECYSTECTOMY WITH INTRAOPERATIVE CHOLANGIOGRAM (N/A)  Patient Location: PACU  Anesthesia Type: General  Level of Consciousness: awake and alert   Airway and Oxygen Therapy: Patient Spontanous Breathing  Post-op Pain: mild  Post-op Assessment: Post-op Vital signs reviewed, Patient's Cardiovascular Status Stable, Respiratory Function Stable, Patent Airway and No signs of Nausea or vomiting  Post-op Vital Signs: stable  Complications: No apparent anesthesia complications

## 2011-07-16 NOTE — Preoperative (Signed)
Beta Blockers   Reason not to administer Beta Blockers:Not Applicable 

## 2011-07-16 NOTE — Interval H&P Note (Signed)
History and Physical Interval Note:  07/16/2011 9:21 AM  Wanda Mcintosh  has presented today for surgery, with the diagnosis of gallstones  The various methods of treatment have been discussed with the patient and family. After consideration of risks, benefits and other options for treatment, the patient has consented to  Procedure(s) (LRB): LAPAROSCOPIC CHOLECYSTECTOMY WITH INTRAOPERATIVE CHOLANGIOGRAM (N/A) as a surgical intervention .  The patients' history has been reviewed, patient examined, no change in status, stable for surgery.  I have reviewed the patients' chart and labs.  Questions were answered to the patient's satisfaction.     [name],[name]

## 2011-07-19 ENCOUNTER — Encounter (HOSPITAL_COMMUNITY): Payer: Self-pay | Admitting: General Surgery

## 2011-07-26 ENCOUNTER — Telehealth (INDEPENDENT_AMBULATORY_CARE_PROVIDER_SITE_OTHER): Payer: Self-pay | Admitting: General Surgery

## 2011-07-26 ENCOUNTER — Other Ambulatory Visit (INDEPENDENT_AMBULATORY_CARE_PROVIDER_SITE_OTHER): Payer: Self-pay | Admitting: General Surgery

## 2011-07-26 DIAGNOSIS — R197 Diarrhea, unspecified: Secondary | ICD-10-CM

## 2011-07-26 LAB — CBC WITH DIFFERENTIAL/PLATELET
Basophils Absolute: 0 10*3/uL (ref 0.0–0.1)
Basophils Relative: 0 % (ref 0–1)
Eosinophils Relative: 3 % (ref 0–5)
HCT: 45.5 % (ref 36.0–46.0)
Hemoglobin: 15.4 g/dL — ABNORMAL HIGH (ref 12.0–15.0)
Lymphocytes Relative: 33 % (ref 12–46)
MCH: 29.5 pg (ref 26.0–34.0)
MCHC: 33.8 g/dL (ref 30.0–36.0)
MCV: 87.2 fL (ref 78.0–100.0)
Neutro Abs: 3.5 10*3/uL (ref 1.7–7.7)
Platelets: 288 10*3/uL (ref 150–400)
RBC: 5.22 MIL/uL — ABNORMAL HIGH (ref 3.87–5.11)
RDW: 12.3 % (ref 11.5–15.5)

## 2011-07-26 LAB — COMPREHENSIVE METABOLIC PANEL
ALT: 26 U/L (ref 0–35)
AST: 24 U/L (ref 0–37)
Alkaline Phosphatase: 49 U/L (ref 39–117)
BUN: 10 mg/dL (ref 6–23)
Calcium: 9.5 mg/dL (ref 8.4–10.5)
Chloride: 105 mEq/L (ref 96–112)
Creat: 0.81 mg/dL (ref 0.50–1.10)
Potassium: 4 mEq/L (ref 3.5–5.3)

## 2011-07-26 NOTE — Telephone Encounter (Signed)
After speaking with Dr. Carolynne Edouard, I called the pt and informed her she needed to go to [facility] to have a CBC, CMET, and stool sent for c diff.  I also called in a Rx to her pharmacy for phenergan 12.5mg .

## 2011-07-26 NOTE — Telephone Encounter (Signed)
Patient calling in status post lap chole on 07/16/11, very tearful and complaining of "withdrawal symptoms." States she started taking roxicet after surgery and started trying to cut back last Thursday. States she has not been able to keep food down since last Thursday. Every time she eats she vomits. Keeping liquids down. States she is having chills and shakes. No fevers. Has been having diarrhea since surgery. Has also been having trouble sleeping since last Thursday. She has been on chronic Norco for a couple years for back pain and is convinced these are withdrawal symptoms. Please advise.

## 2011-07-26 NOTE — Telephone Encounter (Signed)
i doubt she is withdrawing if she has been on norco for years and is now in roxicet. In addition to cbc and cmet she should also get stool sent for c diff and she can have phenergan 12.5 mg po q6hours prn for nausea, number 20 and 1 refill.

## 2011-08-04 ENCOUNTER — Encounter (INDEPENDENT_AMBULATORY_CARE_PROVIDER_SITE_OTHER): Payer: Self-pay | Admitting: General Surgery

## 2011-08-04 ENCOUNTER — Ambulatory Visit (INDEPENDENT_AMBULATORY_CARE_PROVIDER_SITE_OTHER): Payer: Self-pay | Admitting: General Surgery

## 2011-08-04 DIAGNOSIS — K8020 Calculus of gallbladder without cholecystitis without obstruction: Secondary | ICD-10-CM

## 2011-08-04 NOTE — Progress Notes (Signed)
Subjective:     Patient ID: Wanda Mcintosh, female   DOB: [birth year], 46 y.o.   MRN: [id number]  HPI The patient is a 46 year old white female who is 3 weeks out from a laparoscopic cholecystectomy for cholecystitis with cholelithiasis. She has done well since surgery. Her only complains of some constipation. She is actually back to all her normal activities without any problems.  Review of Systems     Objective:   Physical Exam On exam her abdomen is soft and nontender. Her incisions are healing up very nicely.    Assessment:     Status post laparoscopic cholecystectomy    Plan:     At this point I believe she can return to her normal activities. We will plan to see her back on a p.r.n. basis.

## 2011-08-04 NOTE — Patient Instructions (Signed)
May return to all normal activities 

## 2011-08-25 ENCOUNTER — Encounter (HOSPITAL_COMMUNITY): Payer: Self-pay

## 2011-08-25 ENCOUNTER — Emergency Department (HOSPITAL_COMMUNITY): Payer: Self-pay

## 2011-08-25 ENCOUNTER — Emergency Department (HOSPITAL_COMMUNITY)
Admission: EM | Admit: 2011-08-25 | Discharge: 2011-08-25 | Disposition: A | Discharge status: Home / Self Care | Payer: Self-pay | Attending: Emergency Medicine | Admitting: Emergency Medicine

## 2011-08-25 DIAGNOSIS — M545 Low back pain, unspecified: Secondary | ICD-10-CM | POA: Insufficient documentation

## 2011-08-25 DIAGNOSIS — M549 Dorsalgia, unspecified: Secondary | ICD-10-CM

## 2011-08-25 MED ORDER — OXYCODONE-ACETAMINOPHEN 5-325 MG PO TABS
2.0000 | ORAL_TABLET | Freq: Once | ORAL | Status: AC
  Administered 2011-08-25: 2 via ORAL
  Filled 2011-08-25: qty 2

## 2011-08-25 MED ORDER — OXYCODONE-ACETAMINOPHEN 5-325 MG PO TABS: 1.0000 | ORAL_TABLET | Freq: Four times a day (QID) | ORAL | Status: AC | PRN

## 2011-08-25 NOTE — ED Notes (Signed)
DENIES BOWEL OR BLADDER ISSUES, STATES TOOK A NORCO AROUND 3 PM. NO RELIEF SO CAME TO HOSPITAL. STATES ABLE TO AMBULATE. DENIES NUMBNESS IN LEGS. STATES PAIN RADIATES FROM LOW BACK TO BUTTOCKS TO GROIN
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
## 2011-08-25 NOTE — Discharge Instructions (Signed)
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
## 2011-08-26 NOTE — ED Provider Notes (Signed)
Medical screening examination/treatment/procedure(s) were performed by non-physician practitioner and as supervising physician I was immediately available for consultation/collaboration.    Celene Kras, MD 08/26/11 [phone]

## 2011-11-19 ENCOUNTER — Emergency Department (HOSPITAL_COMMUNITY)
Admission: EM | Admit: 2011-11-19 | Discharge: 2011-11-20 | Disposition: A | Discharge status: Home / Self Care | Payer: Self-pay | Attending: Emergency Medicine | Admitting: Emergency Medicine

## 2011-11-19 DIAGNOSIS — G8929 Other chronic pain: Secondary | ICD-10-CM | POA: Insufficient documentation

## 2011-11-19 DIAGNOSIS — M549 Dorsalgia, unspecified: Secondary | ICD-10-CM | POA: Insufficient documentation

## 2011-11-19 DIAGNOSIS — Z8541 Personal history of malignant neoplasm of cervix uteri: Secondary | ICD-10-CM | POA: Insufficient documentation

## 2011-11-19 DIAGNOSIS — K219 Gastro-esophageal reflux disease without esophagitis: Secondary | ICD-10-CM | POA: Insufficient documentation

## 2011-11-19 NOTE — ED Notes (Signed)
Back procedure done on the 22nd today sat down on a bench and had back and left hip stiffen up. Pain is going into left back down left leg and into groin described as burning pain, unable to ambulate without assisitance

## 2011-11-20 MED ORDER — OXYCODONE-ACETAMINOPHEN 5-325 MG PO TABS: 1.0000 | ORAL_TABLET | Freq: Four times a day (QID) | ORAL | Status: AC | PRN

## 2011-11-20 MED ORDER — HYDROMORPHONE HCL PF 1 MG/ML IJ SOLN
1.0000 mg | Freq: Once | INTRAMUSCULAR | Status: AC
  Administered 2011-11-20: 1 mg via INTRAMUSCULAR
  Filled 2011-11-20: qty 1

## 2011-11-20 MED ORDER — DIAZEPAM 5 MG/ML IJ SOLN
10.0000 mg | Freq: Once | INTRAMUSCULAR | Status: AC
  Administered 2011-11-20: 10 mg via INTRAMUSCULAR
  Filled 2011-11-20: qty 2

## 2011-11-20 NOTE — ED Provider Notes (Signed)
Medical screening examination/treatment/procedure(s) were performed by non-physician practitioner and as supervising physician I was immediately available for consultation/collaboration.    Leland Raver D Adamcik, MD 11/20/11 1654 

## 2011-11-20 NOTE — ED Provider Notes (Signed)
History     CSN: [id number]  Arrival date & time 11/19/11  2324   First MD Initiated Contact with Patient 11/20/11 0046      Chief Complaint  Patient presents with  . Back Pain   HPI  History provided by the patient. Patient is a 46 year old female with history of morbid obesity, chronic back pain who presents with complaints of low back pain radiating to left lower extremity and groin area. Patient states that she went to see her son's football game with sitting on wood bleachers and when she began to stand up felt that she could not stand at all or put any weight on her left leg. She states that her leg felt weak and unsteady. This was associated with sharp pains in the low back that radiated to the hip and groin area. Patient has history of low back pains but denies having pain radiate to the lower extremity or to the groin area. Patient does report having a radioablation of nerves to her back one week ago. She is followed for her chronic back pains for the past several years by Dr. Marissa Nestle and Dr. Modesto Charon. Patient also uses 7.5 mg hydrocodone per day pains. She has not taken any of this for his symptoms. She denies having persistent weakness in lower extremity. She denies any process or numbness at this time. She denies any urinary or fecal incontinence, urinary retention or perineal numbness.    Past Medical History  Diagnosis Date  . Head ache   . Vertigo   . Back injury   . Gallstones   . Chronic back pain   . Migraine   . GERD (gastroesophageal reflux disease)   . Cancer     cervica-l leep  . Complication of anesthesia     possible aggression coming out of anesthesia    Past Surgical History  Procedure Date  . Hand tendon surgery     lft carpal tunnel , lig  . Leep   . Tonsillectomy   . Breast surgery     rt fibroid 90. lft 94  . Cholecystectomy 07/16/2011    Procedure: LAPAROSCOPIC CHOLECYSTECTOMY WITH INTRAOPERATIVE CHOLANGIOGRAM;  Surgeon: Robyne Askew, MD;   Location: [facility] OR;  Service: General;  Laterality: N/A;    Family History  Problem Relation Age of Onset  . Diabetes Maternal Grandmother   . Diabetes Paternal Grandmother     History  Substance Use Topics  . Smoking status: Never Smoker   . Smokeless tobacco: Not on file   Comment: smoked in 7yh grade, occ alcohol  . Alcohol Use: Yes     rarely    OB History    Grav Para Term Preterm Abortions TAB SAB Ect Mult Living                  Review of Systems  Constitutional: Negative for unexpected weight change.  Genitourinary: Negative for dysuria, frequency, hematuria and flank pain.  Musculoskeletal: Positive for back pain.  Neurological: Positive for weakness. Negative for numbness.    Allergies  Flexeril and Zofran  Home Medications   Current Outpatient Rx  Name Route Sig Dispense Refill  . OMEGA-3 FATTY ACIDS 1000 MG PO CAPS Oral Take 2 g by mouth daily.    Marland Kitchen HYDROCODONE-ACETAMINOPHEN 7.5-325 MG PO TABS Oral Take 1 tablet by mouth every 6 (six) hours as needed. For pain    . ADULT MULTIVITAMIN W/MINERALS CH Oral Take 1 tablet by mouth daily.  BP 114/73  Pulse 69  Temp 98.5 F (36.9 C) (Oral)  Resp 16  SpO2 96%  Physical Exam  Nursing note and vitals reviewed. Constitutional: She is oriented to person, place, and time. She appears well-developed and well-nourished. No distress.  HENT:  Head: Normocephalic.  Cardiovascular: Normal rate and regular rhythm.   Pulmonary/Chest: Effort normal and breath sounds normal. No respiratory distress. She has no wheezes. She has no rales.  Abdominal: Soft. There is no tenderness.       No CVA tenderness  Genitourinary:       Chaperone was present. Normal perineal sensation is. Normal rectal tone.  Musculoskeletal:       Cervical back: Normal.       Thoracic back: Normal.       Lumbar back: She exhibits decreased range of motion and tenderness.       Back:  Neurological: She is alert and oriented to person, place,  and time. She has normal strength. No sensory deficit.       Strength and sensations equal bilaterally in lower extremities  Skin: Skin is warm and dry. No rash noted. No erythema.  Psychiatric: She has a normal mood and affect. Her behavior is normal.    ED Course  Procedures    1. Back pain       MDM  2:10 AM patient seen and evaluated. Patient with history of chronic back pains. Pain is slightly new today with radiation to the groin and left lower extremity. Patient had radioablation of nerve one week ago. This was the third time having this performed. No abnormality following the procedure until today.   Patient having improvement of pain after medications. She has had no weakness in lower cherries on exam. No concerning findings for cord compression. At this time patient felt able to return home followup with her back specialist. Patient given strict return precautions.    Angus Seller, [place] 11/20/11 [phone]

## 2011-11-20 NOTE — ED Notes (Signed)
Prescription given with discharge instructions.  

## 2011-12-17 ENCOUNTER — Encounter (HOSPITAL_COMMUNITY): Payer: Self-pay | Admitting: Emergency Medicine

## 2011-12-17 ENCOUNTER — Emergency Department (HOSPITAL_COMMUNITY)
Admission: EM | Admit: 2011-12-17 | Discharge: 2011-12-17 | Disposition: A | Discharge status: Home / Self Care | Payer: Self-pay | Attending: Emergency Medicine | Admitting: Emergency Medicine

## 2011-12-17 DIAGNOSIS — G43909 Migraine, unspecified, not intractable, without status migrainosus: Secondary | ICD-10-CM | POA: Insufficient documentation

## 2011-12-17 DIAGNOSIS — Z8541 Personal history of malignant neoplasm of cervix uteri: Secondary | ICD-10-CM | POA: Insufficient documentation

## 2011-12-17 DIAGNOSIS — K219 Gastro-esophageal reflux disease without esophagitis: Secondary | ICD-10-CM | POA: Insufficient documentation

## 2011-12-17 DIAGNOSIS — Z8669 Personal history of other diseases of the nervous system and sense organs: Secondary | ICD-10-CM

## 2011-12-17 DIAGNOSIS — G441 Vascular headache, not elsewhere classified: Secondary | ICD-10-CM

## 2011-12-17 DIAGNOSIS — G8929 Other chronic pain: Secondary | ICD-10-CM | POA: Insufficient documentation

## 2011-12-17 MED ORDER — HYDROMORPHONE HCL PF 1 MG/ML IJ SOLN
0.5000 mg | Freq: Once | INTRAMUSCULAR | Status: AC
  Administered 2011-12-17: 0.5 mg via INTRAVENOUS
  Filled 2011-12-17: qty 1

## 2011-12-17 MED ORDER — KETOROLAC TROMETHAMINE 15 MG/ML IJ SOLN
15.0000 mg | Freq: Once | INTRAMUSCULAR | Status: AC
  Administered 2011-12-17: 15 mg via INTRAVENOUS
  Filled 2011-12-17: qty 1

## 2011-12-17 MED ORDER — PROCHLORPERAZINE EDISYLATE 5 MG/ML IJ SOLN
10.0000 mg | Freq: Four times a day (QID) | INTRAMUSCULAR | Status: DC | PRN
  Administered 2011-12-17: 10 mg via INTRAVENOUS
  Filled 2011-12-17 (×2): qty 2

## 2011-12-17 MED ORDER — SUMATRIPTAN SUCCINATE 6 MG/0.5ML ~~LOC~~ SOLN
6.0000 mg | Freq: Once | SUBCUTANEOUS | Status: AC
  Administered 2011-12-17: 6 mg via SUBCUTANEOUS
  Filled 2011-12-17: qty 0.5

## 2011-12-17 MED ORDER — SODIUM CHLORIDE 0.9 % IV BOLUS (SEPSIS)
1000.0000 mL | Freq: Once | INTRAVENOUS | Status: AC
  Administered 2011-12-17: 1000 mL via INTRAVENOUS

## 2011-12-17 NOTE — ED Provider Notes (Signed)
History     CSN: [id number]  Arrival date & time 12/17/11  1311   First MD Initiated Contact with Patient 12/17/11 1504      Chief Complaint  Patient presents with  . Headache  . Nausea  . Facial Pain    (Consider location/radiation/quality/duration/timing/severity/associated sxs/prior treatment) HPIVirginia K Mcintosh is a 46 y.o. female with a history of migraines are typically controlled with over-the-counter migraine medicine. 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 The headache came on gradually- although quickly, but was not sudden onset worst headache of her life. She took her normal medicine which consists of 12-1/2 mg of Phenergan as well as Norco which failed to stop the headache, she lay down in a dark room but her headache persisted. It is worse when she is up moving around, talking and is somewhat relieved with her laying down or laying still in a quiet room. She did have a similar headache to this 2 weeks ago. This is somewhat different than her typical migraine it is it feels like a pressure starts about mid face as if you do a line through the zygomatic arches only up to the top of her head through midway of her occiput. This feels like a pressure, it is currently 6/10, not associated with any paresthesias, dysarthria, weakness numbness, or sinus pain or pressure. Patient denies any significant allergy history (such as mild allergies every once in a while) says she's had no fevers chills or foul tasting smelling drainage.  Last menstrual period was 12/05/2011. Last time she had anything to eat or drink was at a 100 this morning to  Past Medical History  Diagnosis Date  . Head ache   . Vertigo   . Back injury   . Gallstones   . Chronic back pain   . Migraine   . GERD (gastroesophageal reflux disease)   . Cancer     cervica-l leep  . Complication of anesthesia     possible  aggression coming out of anesthesia    Past Surgical History  Procedure Date  . Hand tendon surgery     lft carpal tunnel , lig  . Leep   . Tonsillectomy   . Breast surgery     rt fibroid 90. lft 94  . Cholecystectomy 07/16/2011    Procedure: LAPAROSCOPIC CHOLECYSTECTOMY WITH INTRAOPERATIVE CHOLANGIOGRAM;  Surgeon: Robyne Askew, MD;  Location: [facility] OR;  Service: General;  Laterality: N/A;  . Hand surgery     Family History  Problem Relation Age of Onset  . Diabetes Maternal Grandmother   . Diabetes Paternal Grandmother     History  Substance Use Topics  . Smoking status: Never Smoker   . Smokeless tobacco: Not on file   Comment: smoked in 7yh grade, occ alcohol  . Alcohol Use: Yes     rarely    OB History    Grav Para Term Preterm Abortions TAB SAB Ect Mult Living                  Review of Systems At least 10pt or greater review of systems completed and are negative except where specified in the HPI.  Allergies  Flexeril and Zofran  Home Medications   Current Outpatient Rx  Name Route Sig Dispense Refill  . ASPIRIN EC 81 MG PO TBEC Oral Take 81 mg by mouth daily.    Marland Kitchen  OMEGA-3 FATTY ACIDS 1000 MG PO CAPS Oral Take 2 g by mouth daily.    Marland Kitchen HYDROCODONE-ACETAMINOPHEN 7.5-325 MG PO TABS Oral Take 1 tablet by mouth every 6 (six) hours as needed. For pain    . ADULT MULTIVITAMIN W/MINERALS CH Oral Take 1 tablet by mouth daily.    Marland Kitchen OMEPRAZOLE MAGNESIUM 20 MG PO TBEC Oral Take 20 mg by mouth daily.    Marland Kitchen PROMETHAZINE HCL 12.5 MG PO TABS Oral Take 12.5 mg by mouth every 6 (six) hours as needed.      BP 110/71  Pulse 71  Temp 97.8 F (36.6 C) (Oral)  Resp 16  Ht 5\' 8"  (1.727 m)  Wt 255 lb (115.667 kg)  BMI 38.77 kg/m2  SpO2 96%  LMP 12/05/2011  Physical Exam  Nursing notes reviewed.  Electronic medical record reviewed. VITAL SIGNS:   Filed Vitals:   12/17/11 1358 12/17/11 1359 12/17/11 1435 12/17/11 1545  BP: 121/86  110/71 104/59  Pulse:  81 71 68    Temp:   97.8 F (36.6 C)   TempSrc:   Oral   Resp:   16   Height:      Weight:      SpO2:  97% 96% 98%   CONSTITUTIONAL: Awake, oriented, appears non-toxic HENT: Atraumatic, normocephalic, oral mucosa pink and moist, airway patent. Nares patent without drainage. External ears normal. EYES: Conjunctiva clear, EOMI, PERRLA NECK: Trachea midline, non-tender, supple CARDIOVASCULAR: Normal heart rate, Normal rhythm, No murmurs, rubs, gallops PULMONARY/CHEST: Clear to auscultation, no rhonchi, wheezes, or rales. Symmetrical breath sounds. Non-tender. ABDOMINAL: Non-distended, soft, non-tender - no rebound or guarding.  BS normal. NEUROLOGIC: Non-focal, moving all four extremities, no gross sensory or motor deficits.  No visual field deficits, EOMI, PERRLA 5 mm Facial sensation equal to light touch bilaterally.  Good muscle bulk in the masseter muscle and good lateral movement of the jaw.  Facial expressions equal and good strength with smile/frown and puffed cheeks.  Hearing grossly intact to finger rub test.  Uvula, tongue are midline with no deviation. Symmetrical palate elevation.  Trapezius and SCM muscles are 5/5 strength bilaterally.  Strength is 5/5 upper extremities and lower tremors flexors and extensors. Grossly intact sensation distally upper and lower extremities EXTREMITIES: No clubbing, cyanosis, or edema SKIN: Warm, Dry, No erythema, No rash  ED Course  Procedures (including critical care time)  Labs Reviewed - No data to display No results found.   1. Vascular headache   2. History of migraine      Medications  promethazine (PHENERGAN) 12.5 MG tablet (not administered)  omeprazole (PRILOSEC OTC) 20 MG tablet (not administered)  aspirin EC 81 MG tablet (not administered)  sodium chloride 0.9 % bolus 1,000 mL (1000 mL Intravenous Given 12/17/11 1548)  HYDROmorphone (DILAUDID) injection 0.5 mg (0.5 mg Intravenous Given 12/17/11 1548)  SUMAtriptan (IMITREX) injection 6 mg  (6 mg Subcutaneous Given 12/17/11 2015)  ketorolac (TORADOL) 15 MG/ML injection 15 mg (15 mg Intravenous Given 12/17/11 2015)     MDM  Wanda Mcintosh is a 46 y.o. female with a history of migraine who presents to the emergency department with a slightly different migraine headache. She has had one of these before and it did pass uneventfully. My suspicion is low for subarachnoid hemorrhage in this patient, likewise have also considered meningitis or encephalitis however think this is also very unlikely given the patient's nontoxic appearance, she is afebrile as well. If the patient is likely suffering from a slightly  different vascular headache, she has a normal neurologic exam so we'll treat her symptomatically. Give her fluids, Compazine, and Dilaudid.  Patient's headache got slightly better down to about a 6/10 after fluids and Compazine however this worsened again and is about an 8/10.  Give the patient a 6 mg of subcutaneous sumatriptan and along with 15 mg of Toradol.   12/17/2011 9:07 PM  On reevaluation the patient is pain-free at this time she is no longer nauseous and like to go home. 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 I've given her resources to find a primary care physician so that she may have somebody to manage her headaches long term.  I explained the diagnosis and have given explicit precautions to return to the ER including worsening headache, focal neurologic deficits or any other new or worsening symptoms. The patient understands and accepts the medical plan as it's been dictated and I have answered their questions. Discharge instructions concerning home care and prescriptions have been given.  The patient is STABLE and is discharged to home in good condition.         Jones Skene, MD 12/18/11 [phone]

## 2011-12-17 NOTE — ED Notes (Signed)
H/a started at 6:30 this morning, has hx migraines states this doesn't feel like on of them. Pain on both sides of face, radiates to top of head. Nauseated with vomiting today

## 2011-12-17 NOTE — ED Notes (Signed)
Bedside report received from previous RN 

## 2011-12-17 NOTE — ED Notes (Signed)
Assisted pt to restroom very minimal assistance

## 2012-06-29 ENCOUNTER — Encounter (HOSPITAL_COMMUNITY): Payer: Self-pay | Admitting: *Deleted

## 2012-06-29 ENCOUNTER — Emergency Department (INDEPENDENT_AMBULATORY_CARE_PROVIDER_SITE_OTHER)
Admission: EM | Admit: 2012-06-29 | Discharge: 2012-06-29 | Disposition: A | Discharge status: Home / Self Care | Payer: Medicaid Other | Source: Home / Self Care | Attending: Emergency Medicine | Admitting: Emergency Medicine

## 2012-06-29 DIAGNOSIS — M545 Low back pain, unspecified: Secondary | ICD-10-CM

## 2012-06-29 MED ORDER — HYDROCODONE-ACETAMINOPHEN 7.5-325 MG PO TABS: ORAL_TABLET | ORAL | Status: DC

## 2012-06-29 NOTE — ED Provider Notes (Signed)
Chief Complaint:   Chief Complaint  Patient presents with  . Back Pain    History of Present Illness:   Wanda Mcintosh is a 47 year old female who has had a 4 year history of lower back pain. This has been diagnosed as lumbago. Nothing has been found on multiple studies including MRIs. She was initially seen Dr. Modesto Charon and he did some ablations. These initially helped, then stop working. He therefore discharge her from his followup and referred her to pain management. She had problems with several pain management clinic and says she now is on her third. Dr. Modesto Charon refilled her pain medication for the end of March but she's out of them now and needs something to control the pain. She had been on hydrocodone/APAP 7.5/325 twice a day with fair relief of the pain. The pain radiates into her groin and thigh areas, but not down her legs and she denies any numbness, tingling, weakness in the legs. She has no incontinence of urine, urinary retention, dysuria, frequency, hematuria, incontinence of stool, saddle anesthesia, fever, chills, unintentional weight loss. She has an appointment with a pain management clinic in about 2 weeks.  Review of Systems:  Other than noted above, the patient denies any of the following symptoms: Systemic:  No fever, chills, severe fatigue, or unexplained weight loss. GI:  No abdominal pain, nausea, vomiting, diarrhea, constipation, incontinence of bowel, or blood in stool. GU:  No dysuria, frequency, urgency, or hematuria. No incontinence of urine or difficulty urinating.  M-S:  No neck pain, joint pain, arthritis, or myalgias. Neuro:  No paresthesias, saddle anesthesia, muscular weakness, or progressive neurological deficit.  PMFSH:  Past medical history, family history, social history, meds, and allergies were reviewed. Specifically, there is no history of cancer, major trauma, osteoporosis, immunosuppression, HIV, or IV or injection drug use.  Physical Exam:   Vital signs:   BP 106/72  Pulse 98  Temp(Src) 98 F (36.7 C) (Oral)  Resp 18  SpO2 98%  LMP 06/24/2012 General:  Alert, oriented, in no distress. Abdomen:  Soft, non-tender.  No organomegaly or mass.  No pulsatile midline abdominal mass or bruit. Back:  Lower back is very tender to palpation. Her back has a limited range of motion with 45 of flexion, 0 extension, 10 of lateral bending, and 45 of rotation with pain. Straight leg raising was negative. Neuro:  Normal muscle strength, sensations and DTRs. Extremities: Pedal pulses were full, there was no edema. Skin:  Clear, warm and dry.  No rash.  Assessment:  The encounter diagnosis was Low back pain.  I will refill her enough pain meds to get her through the next 2 weeks, but after that she will need to look to her pain management specialist for any further refills.  Plan:   1.  The following meds were prescribed:   Discharge Medication List as of 06/29/2012  7:07 PM    START taking these medications   Details  !! HYDROcodone-acetaminophen (NORCO) 7.5-325 MG per tablet 1 BID as needed for low back pain., Print     !! - Potential duplicate medications found. Please discuss with provider.     2.  The patient was instructed in symptomatic care and handouts were given. 3.  The patient was told to return if becoming worse in any way, if no better in 2 weeks, and given some red flag symptoms including new neurological symptoms, fever, or weight loss that would indicate earlier return. 4.  The patient was encouraged  to try to be as active as possible and given some exercises to do followed by moist heat.    Reuben Likes, MD 06/29/12 2107

## 2012-06-29 NOTE — ED Notes (Signed)
Patient complains of chronic lower back pain. Patient states she was discharged to pain management, but was discharged because she does not have insurance. She states she is waiting on medicaid and was told by medicaid to come to [facility].

## 2012-07-17 ENCOUNTER — Emergency Department (HOSPITAL_COMMUNITY)
Admission: EM | Admit: 2012-07-17 | Discharge: 2012-07-17 | Disposition: A | Discharge status: Home / Self Care | Payer: Medicaid Other | Attending: Emergency Medicine | Admitting: Emergency Medicine

## 2012-07-17 ENCOUNTER — Encounter (HOSPITAL_COMMUNITY): Payer: Self-pay | Admitting: *Deleted

## 2012-07-17 DIAGNOSIS — M545 Low back pain, unspecified: Secondary | ICD-10-CM | POA: Insufficient documentation

## 2012-07-17 DIAGNOSIS — Z87828 Personal history of other (healed) physical injury and trauma: Secondary | ICD-10-CM | POA: Insufficient documentation

## 2012-07-17 DIAGNOSIS — Z8719 Personal history of other diseases of the digestive system: Secondary | ICD-10-CM | POA: Insufficient documentation

## 2012-07-17 DIAGNOSIS — Z7982 Long term (current) use of aspirin: Secondary | ICD-10-CM | POA: Insufficient documentation

## 2012-07-17 DIAGNOSIS — G8929 Other chronic pain: Secondary | ICD-10-CM | POA: Insufficient documentation

## 2012-07-17 DIAGNOSIS — Z79899 Other long term (current) drug therapy: Secondary | ICD-10-CM | POA: Insufficient documentation

## 2012-07-17 DIAGNOSIS — Z8589 Personal history of malignant neoplasm of other organs and systems: Secondary | ICD-10-CM | POA: Insufficient documentation

## 2012-07-17 MED ORDER — HYDROCODONE-ACETAMINOPHEN 5-325 MG PO TABS: ORAL_TABLET | ORAL | Status: DC

## 2012-07-17 MED ORDER — HYDROCODONE-ACETAMINOPHEN 7.5-325 MG PO TABS: ORAL_TABLET | ORAL | Status: DC

## 2012-07-17 NOTE — ED Notes (Signed)
Pt c/o lower back pain, states she sustained injury in 2010, needs pain management but unable to get into clinic due to insurance.

## 2012-07-17 NOTE — ED Notes (Signed)
Pt presents with chronic lower back pain from back injury.  States the pain is in her lower back and radiates into her legs.  Pt has applied for Medicaid but is still waiting for approval in order to see PCP.  Pt ran out of pain medication and needs a refill.

## 2012-07-17 NOTE — ED Provider Notes (Signed)
History    This chart was scribed for Junius Finner, PA working with Juliet Rude. Rubin Payor, MD by ED Scribe, Burman Nieves. This patient was seen in room TR06C/TR06C and the patient's care was started at 9:54 PM.   CSN: [id number]  Arrival date & time 07/17/12  1928   First MD Initiated Contact with Patient 07/17/12 2154      Chief Complaint  Patient presents with  . Back Pain    (Consider location/radiation/quality/duration/timing/severity/associated sxs/prior treatment) Patient is a 47 y.o. female presenting with back pain. The history is provided by the patient. No language interpreter was used.  Back Pain Location:  Lumbar spine Radiates to:  L posterior upper leg and L thigh Pain severity:  Moderate Chronicity:  Chronic Worsened by:  Movement Associated symptoms: no abdominal pain, no bladder incontinence, no bowel incontinence and no numbness   Risk factors: obesity    [place] K Seif is a 47 y.o. female who presents to the Emergency Department complaining of moderate constant lower back pain . Pt states she has chronic lower back pain and ran out of her medication recently. She states that the pain radiates down into her left hip and thigh and twisting exacerbates the pain. She has been  trying to get on an insurance plan which has enabled her to get pain management. She sustained an injury back in 2010. Pt denies numbing or tingling in extremities, bladder/bowel incontinence, abdominal pain, fever, chills, cough, nausea, vomiting, diarrhea, SOB, weakness, and any other associated symptoms. Pt usually takes Norco7.5mg  for the pain which she is currently out of.    Past Medical History  Diagnosis Date  . Head ache   . Vertigo   . Back injury   . Gallstones   . Chronic back pain   . Migraine   . GERD (gastroesophageal reflux disease)   . Cancer     cervica-l leep  . Complication of anesthesia     possible aggression coming out of anesthesia    Past Surgical History   Procedure Laterality Date  . Hand tendon surgery      lft carpal tunnel , lig  . Leep    . Tonsillectomy    . Breast surgery      rt fibroid 90. lft 94  . Cholecystectomy  07/16/2011    Procedure: LAPAROSCOPIC CHOLECYSTECTOMY WITH INTRAOPERATIVE CHOLANGIOGRAM;  Surgeon: Robyne Askew, MD;  Location: [facility] OR;  Service: General;  Laterality: N/A;  . Hand surgery      Family History  Problem Relation Age of Onset  . Diabetes Maternal Grandmother   . Diabetes Paternal Grandmother     History  Substance Use Topics  . Smoking status: Never Smoker   . Smokeless tobacco: Not on file     Comment: smoked in 7yh grade, occ alcohol  . Alcohol Use: Yes     Comment: rarely    OB History   Grav Para Term Preterm Abortions TAB SAB Ect Mult Living                  Review of Systems  Gastrointestinal: Negative for abdominal pain and bowel incontinence.  Genitourinary: Negative for bladder incontinence.  Musculoskeletal: Positive for back pain.  Neurological: Negative for numbness.  All other systems reviewed and are negative.    Allergies  Flexeril and Zofran  Home Medications   Current Outpatient Rx  Name  Route  Sig  Dispense  Refill  . HYDROcodone-acetaminophen (NORCO) 7.5-325 MG per tablet  Oral   Take 1 tablet by mouth 2 (two) times daily. 1 BID as needed for low back pain.         . Ibuprofen-Diphenhydramine HCl (ADVIL PM) 200-25 MG CAPS   Oral   Take 1 capsule by mouth once.         . Naproxen Sodium (ALEVE PO)   Oral   Take 2 tablets by mouth once.         Marland Kitchen aspirin EC 81 MG tablet   Oral   Take 81 mg by mouth daily.         Marland Kitchen HYDROcodone-acetaminophen (NORCO) 7.5-325 MG per tablet      Take 1-2 tabs by mouth every 4-6hrs as needed for pain.   15 tablet   0     BP 141/89  Pulse 54  Temp(Src) 98 F (36.7 C) (Oral)  Resp 16  SpO2 98%  LMP 06/24/2012  Physical Exam  Nursing note and vitals reviewed. Constitutional: She is oriented to  person, place, and time. She appears well-developed and well-nourished. No distress.  HENT:  Head: Normocephalic and atraumatic.  Right Ear: External ear normal.  Left Ear: External ear normal.  Eyes: Conjunctivae and EOM are normal. Pupils are equal, round, and reactive to light.  Neck: Neck supple. No tracheal deviation present.  Cardiovascular: Normal rate, regular rhythm and normal heart sounds.   Pulmonary/Chest: Effort normal and breath sounds normal. No respiratory distress.  Abdominal: Soft. Bowel sounds are normal. She exhibits no mass. There is no tenderness. There is no rebound.  Musculoskeletal: Normal range of motion. She exhibits tenderness.  TTP over lumbar muscles left of spine.   Neurological: She is alert and oriented to person, place, and time. No cranial nerve deficit or sensory deficit. She exhibits normal muscle tone. She displays a negative Romberg sign. Gait normal.  Skin: Skin is warm and dry. No rash noted. No erythema.  Psychiatric: She has a normal mood and affect. Her behavior is normal.    ED Course  Procedures (including critical care time) DIAGNOSTIC STUDIES: Oxygen Saturation is 98% on room air, normal by my interpretation.    COORDINATION OF CARE: 10:26 PM Discussed ED treatment with pt and pt agrees.  10:51 PM Discussed discharge papers with pt and pt agrees.   Labs Reviewed - No data to display No results found.   1. Chronic lower back pain       MDM  Pt needs refill on pain medication for chronic LBP. Pt still uninsured and does not know when they will clear her medicaid.  Pt became tearful when asked if she needed a Recruitment consultant.  Apologized multiple times for taking up time for refill.  Provided pt with resource guide, will have pt f/u with PCP and pain management when insurance comes through.   Rx: norco   I personally performed the services described in this documentation, which was scribed in my presence. The recorded  information has been reviewed and is accurate.          Junius Finner, PA-C 07/18/12 1811

## 2012-07-19 NOTE — ED Provider Notes (Signed)
Medical screening examination/treatment/procedure(s) were performed by non-physician practitioner and as supervising physician I was immediately available for consultation/collaboration.  Danicka Hourihan R. Deriona Altemose, MD 07/19/12 0017 

## 2012-07-24 ENCOUNTER — Encounter (HOSPITAL_COMMUNITY): Payer: Self-pay

## 2012-07-24 ENCOUNTER — Emergency Department (HOSPITAL_COMMUNITY)
Admission: EM | Admit: 2012-07-24 | Discharge: 2012-07-24 | Disposition: A | Discharge status: Home / Self Care | Payer: Medicaid Other | Source: Home / Self Care

## 2012-07-24 DIAGNOSIS — M545 Low back pain: Secondary | ICD-10-CM

## 2012-07-24 DIAGNOSIS — G8929 Other chronic pain: Secondary | ICD-10-CM

## 2012-07-24 MED ORDER — HYDROCODONE-ACETAMINOPHEN 7.5-325 MG PO TABS: 1.0000 | ORAL_TABLET | Freq: Two times a day (BID) | ORAL | Status: DC

## 2012-07-24 NOTE — ED Provider Notes (Signed)
History     CSN: [id number]  Arrival date & time 07/24/12  1737   None     Chief Complaint  Patient presents with  . Back Pain    (Consider location/radiation/quality/duration/timing/severity/associated sxs/prior treatment) Patient is a 47 y.o. female presenting with back pain. The history is provided by the patient.  Back Pain Location:  Lumbar spine Progression:  Unchanged Context comment:  Pt with visit for chronic lbp out of meds from er visit 4/28 , no change in cond but states medicaid has gone thru and should have pcp in 1-2 days, no longer to utilize er or ucc for pain management, Relieved by:  Narcotics Associated symptoms: leg pain   Associated symptoms: no abdominal pain     Past Medical History  Diagnosis Date  . Head ache   . Vertigo   . Back injury   . Gallstones   . Chronic back pain   . Migraine   . GERD (gastroesophageal reflux disease)   . Cancer     cervica-l leep  . Complication of anesthesia     possible aggression coming out of anesthesia    Past Surgical History  Procedure Laterality Date  . Hand tendon surgery      lft carpal tunnel , lig  . Leep    . Tonsillectomy    . Breast surgery      rt fibroid 90. lft 94  . Cholecystectomy  07/16/2011    Procedure: LAPAROSCOPIC CHOLECYSTECTOMY WITH INTRAOPERATIVE CHOLANGIOGRAM;  Surgeon: Robyne Askew, MD;  Location: [facility] OR;  Service: General;  Laterality: N/A;  . Hand surgery      Family History  Problem Relation Age of Onset  . Diabetes Maternal Grandmother   . Diabetes Paternal Grandmother     History  Substance Use Topics  . Smoking status: Never Smoker   . Smokeless tobacco: Not on file     Comment: smoked in 7yh grade, occ alcohol  . Alcohol Use: Yes     Comment: rarely    OB History   Grav Para Term Preterm Abortions TAB SAB Ect Mult Living                  Review of Systems  Constitutional: Negative.   Gastrointestinal: Negative.  Negative for abdominal pain.   Genitourinary: Negative.   Musculoskeletal: Positive for back pain.    Allergies  Flexeril and Zofran  Home Medications   Current Outpatient Rx  Name  Route  Sig  Dispense  Refill  . aspirin EC 81 MG tablet   Oral   Take 81 mg by mouth daily.         Marland Kitchen HYDROcodone-acetaminophen (NORCO) 7.5-325 MG per tablet   Oral   Take 1 tablet by mouth 2 (two) times daily. 1 BID as needed for low back pain.         Marland Kitchen HYDROcodone-acetaminophen (NORCO) 7.5-325 MG per tablet      Take 1-2 tabs by mouth every 4-6hrs as needed for pain.   15 tablet   0   . HYDROcodone-acetaminophen (NORCO) 7.5-325 MG per tablet   Oral   Take 1 tablet by mouth 2 (two) times daily.   14 tablet   0   . Ibuprofen-Diphenhydramine HCl (ADVIL PM) 200-25 MG CAPS   Oral   Take 1 capsule by mouth once.         . Naproxen Sodium (ALEVE PO)   Oral   Take 2 tablets by mouth  once.           BP 137/86  Pulse 70  Temp(Src) 98.5 F (36.9 C) (Oral)  Resp 16  LMP 06/24/2012  Physical Exam  Nursing note and vitals reviewed. Constitutional: She is oriented to person, place, and time. She appears well-developed and well-nourished.  Sitting cross legged on exam table in nad.  Abdominal: She exhibits no mass. There is no tenderness. There is no rebound.  Neurological: She is alert and oriented to person, place, and time.  Skin: Skin is warm and dry.    ED Course  Procedures (including critical care time)  Labs Reviewed - No data to display No results found.   1. Chronic lower back pain       MDM          Linna Hoff, MD 07/24/12 [phone]

## 2012-07-24 NOTE — ED Notes (Signed)
Back pain; out of pain Rx

## 2012-10-06 ENCOUNTER — Emergency Department (HOSPITAL_COMMUNITY)
Admission: EM | Admit: 2012-10-06 | Discharge: 2012-10-07 | Disposition: A | Discharge status: Home / Self Care | Payer: Medicaid Other | Attending: Emergency Medicine | Admitting: Emergency Medicine

## 2012-10-06 DIAGNOSIS — M549 Dorsalgia, unspecified: Secondary | ICD-10-CM

## 2012-10-06 DIAGNOSIS — Z79899 Other long term (current) drug therapy: Secondary | ICD-10-CM | POA: Insufficient documentation

## 2012-10-06 DIAGNOSIS — Z8719 Personal history of other diseases of the digestive system: Secondary | ICD-10-CM | POA: Insufficient documentation

## 2012-10-06 DIAGNOSIS — Z87828 Personal history of other (healed) physical injury and trauma: Secondary | ICD-10-CM | POA: Insufficient documentation

## 2012-10-06 DIAGNOSIS — M545 Low back pain, unspecified: Secondary | ICD-10-CM | POA: Insufficient documentation

## 2012-10-06 DIAGNOSIS — G8929 Other chronic pain: Secondary | ICD-10-CM | POA: Insufficient documentation

## 2012-10-06 DIAGNOSIS — Z8541 Personal history of malignant neoplasm of cervix uteri: Secondary | ICD-10-CM | POA: Insufficient documentation

## 2012-10-06 DIAGNOSIS — G43909 Migraine, unspecified, not intractable, without status migrainosus: Secondary | ICD-10-CM | POA: Insufficient documentation

## 2012-10-06 NOTE — ED Notes (Signed)
Pt was in bed for 24 hours with a migraine pt got up to take a shower. When pt sat down she felt severe pain from hip to hip across back and into groin area. Pt having intermittent burning sensation and spasms as well. Pt took 3 tramadol but it was not effective

## 2012-10-07 MED ORDER — OXYCODONE-ACETAMINOPHEN 5-325 MG PO TABS: 1.0000 | ORAL_TABLET | ORAL | Status: DC | PRN

## 2012-10-07 NOTE — ED Provider Notes (Signed)
History    CSN: [id number] Arrival date & time 10/06/12  2322  First MD Initiated Contact with Patient 10/07/12 0025     No chief complaint on file.  (Consider location/radiation/quality/duration/timing/severity/associated sxs/prior Treatment) The history is provided by the patient and medical records.   Patient presents to the ED for low back pain. Patient states she has been in bed for the past 24 hours due to significant migraine. She reports after she showered earlier today she felt a severe pain across her entire lower back causing her excruciating pain with movement and ambulation. No recent injury or trauma.  Patient states she's been unable to find a comfortable position. Has taken 3 tramadol prior to arrival without significant relief. Denies any numbness or paresthesias of lower extremities. No loss of bowel or bladder function.  Prior back injury several years ago, has occasional flares.  Past Medical History  Diagnosis Date  . Head ache   . Vertigo   . Back injury   . Gallstones   . Chronic back pain   . Migraine   . GERD (gastroesophageal reflux disease)   . Cancer     cervica-l leep  . Complication of anesthesia     possible aggression coming out of anesthesia   Past Surgical History  Procedure Laterality Date  . Hand tendon surgery      lft carpal tunnel , lig  . Leep    . Tonsillectomy    . Breast surgery      rt fibroid 90. lft 94  . Cholecystectomy  07/16/2011    Procedure: LAPAROSCOPIC CHOLECYSTECTOMY WITH INTRAOPERATIVE CHOLANGIOGRAM;  Surgeon: Robyne Askew, MD;  Location: [facility] OR;  Service: General;  Laterality: N/A;  . Hand surgery     Family History  Problem Relation Age of Onset  . Diabetes Maternal Grandmother   . Diabetes Paternal Grandmother    History  Substance Use Topics  . Smoking status: Never Smoker   . Smokeless tobacco: Not on file     Comment: smoked in 7yh grade, occ alcohol  . Alcohol Use: Yes     Comment: rarely   OB  History   Grav Para Term Preterm Abortions TAB SAB Ect Mult Living                 Review of Systems  Musculoskeletal: Positive for back pain.  All other systems reviewed and are negative.    Allergies  Flexeril; Gabapentin; and Zofran  Home Medications   Current Outpatient Rx  Name  Route  Sig  Dispense  Refill  . acetaminophen (TYLENOL) 500 MG tablet   Oral   Take 500 mg by mouth every 6 (six) hours as needed for pain (migraine).         Marland Kitchen aspirin EC 81 MG tablet   Oral   Take 81 mg by mouth every 6 (six) hours as needed for pain (migraine).          . Ibuprofen-Diphenhydramine HCl (ADVIL PM) 200-25 MG CAPS   Oral   Take 1 tablet by mouth at bedtime as needed (pain).         . promethazine (PHENERGAN) 25 MG tablet   Oral   Take 25 mg by mouth every 6 (six) hours as needed for nausea.         . traMADol (ULTRAM) 50 MG tablet   Oral   Take 50 mg by mouth every 6 (six) hours as needed for pain.         Marland Kitchen  zaleplon (SONATA) 5 MG capsule   Oral   Take 5 mg by mouth at bedtime as needed (sleep).          BP 115/73  Pulse 81  Temp(Src) 98.5 F (36.9 C) (Oral)  Resp 18  SpO2 98%  Physical Exam  Nursing note and vitals reviewed. Constitutional: She is oriented to person, place, and time. She appears well-developed and well-nourished.  HENT:  Head: Normocephalic and atraumatic.  Mouth/Throat: Oropharynx is clear and moist.  Eyes: Conjunctivae and EOM are normal. Pupils are equal, round, and reactive to light.  Neck: Normal range of motion. Neck supple.  Cardiovascular: Normal rate, regular rhythm and normal heart sounds.   Pulmonary/Chest: Effort normal and breath sounds normal. No respiratory distress.  Musculoskeletal:       Lumbar back: She exhibits decreased range of motion (due to pain), tenderness and pain. She exhibits no bony tenderness, no swelling, no edema, no deformity, no laceration, no spasm and normal pulse.       Back:  Diffuse TTP of  lumbar spine, limited ROM due to pain, distal sensation intact  Neurological: She is alert and oriented to person, place, and time.  Skin: Skin is warm and dry.  Psychiatric: She has a normal mood and affect.    ED Course  Procedures (including critical care time) Labs Reviewed - No data to display No results found.  1. Back pain     MDM   Acute on chronic back pain, no recent injury or trauma-- imaging deferred. Rx Percocet. Discussed plan with patient, she agreed. Return precautions advised.  Garlon Hatchet, PA-C 10/07/12 0104

## 2012-10-09 NOTE — ED Provider Notes (Signed)
Medical screening examination/treatment/procedure(s) were performed by non-physician practitioner and as supervising physician I was immediately available for consultation/collaboration.  Candyce Churn, MD 10/09/12 [phone]

## 2012-10-12 ENCOUNTER — Emergency Department (HOSPITAL_COMMUNITY)
Admission: EM | Admit: 2012-10-12 | Discharge: 2012-10-12 | Disposition: A | Discharge status: Home / Self Care | Payer: Medicaid Other | Attending: Emergency Medicine | Admitting: Emergency Medicine

## 2012-10-12 ENCOUNTER — Encounter (HOSPITAL_COMMUNITY): Payer: Self-pay | Admitting: Family Medicine

## 2012-10-12 ENCOUNTER — Emergency Department (HOSPITAL_COMMUNITY): Payer: Medicaid Other

## 2012-10-12 DIAGNOSIS — Z8719 Personal history of other diseases of the digestive system: Secondary | ICD-10-CM | POA: Insufficient documentation

## 2012-10-12 DIAGNOSIS — M545 Low back pain, unspecified: Secondary | ICD-10-CM | POA: Insufficient documentation

## 2012-10-12 DIAGNOSIS — Z8679 Personal history of other diseases of the circulatory system: Secondary | ICD-10-CM | POA: Insufficient documentation

## 2012-10-12 DIAGNOSIS — R079 Chest pain, unspecified: Secondary | ICD-10-CM | POA: Insufficient documentation

## 2012-10-12 DIAGNOSIS — Z8541 Personal history of malignant neoplasm of cervix uteri: Secondary | ICD-10-CM | POA: Insufficient documentation

## 2012-10-12 DIAGNOSIS — G8929 Other chronic pain: Secondary | ICD-10-CM | POA: Insufficient documentation

## 2012-10-12 DIAGNOSIS — Z87828 Personal history of other (healed) physical injury and trauma: Secondary | ICD-10-CM | POA: Insufficient documentation

## 2012-10-12 DIAGNOSIS — IMO0001 Reserved for inherently not codable concepts without codable children: Secondary | ICD-10-CM | POA: Insufficient documentation

## 2012-10-12 DIAGNOSIS — M542 Cervicalgia: Secondary | ICD-10-CM | POA: Insufficient documentation

## 2012-10-12 MED ORDER — METHOCARBAMOL 500 MG PO TABS: 500.0000 mg | ORAL_TABLET | Freq: Four times a day (QID) | ORAL | Status: DC | PRN

## 2012-10-12 MED ORDER — HYDROCODONE-ACETAMINOPHEN 5-325 MG PO TABS
1.0000 | ORAL_TABLET | Freq: Once | ORAL | Status: AC
  Administered 2012-10-12: 1 via ORAL
  Filled 2012-10-12: qty 1

## 2012-10-12 NOTE — ED Notes (Signed)
Patient transported to X-ray 

## 2012-10-12 NOTE — ED Provider Notes (Signed)
Medical screening examination/treatment/procedure(s) were performed by non-physician practitioner and as supervising physician I was immediately available for consultation/collaboration.   Charles B. Sheldon, MD 10/12/12 2113 

## 2012-10-12 NOTE — ED Notes (Addendum)
Per pt she took a shower and sat on her bed and pulled her back. sts hx of back injury but this feels different. sts was seen and given pain meds but not better. sts hurting on both sides of her lower back and radiating into hips.

## 2012-10-12 NOTE — ED Provider Notes (Signed)
History  This chart was scribed for Wanda Dredge, PA-C working with Leonette Most B. Bernette Mayers, MD by Greggory Stallion, ED scribe. This patient was seen in room TR11C/TR11C and the patient's care was started at 3:04 PM.  CSN: [id number] Arrival date & time 10/12/12  1330   Chief Complaint  Patient presents with  . Back Pain   The history is provided by the patient. No language interpreter was used.    HPI Comments: Wanda Mcintosh is a 47 y.o. female with h/o remote back injury who presents to the Emergency Department complaining of gradual onset, constant lower back pain that radiates into bilateral hips and groin that started one week ago. She states she took a shower and sat on her bed and developed immediately pain in her back. Pt states she went to the [facility] Friday and was given percocet. Pt states it helps her sleep but does not relieve the pain. She states pressure helps to ease the pain. Pt states sitting up, standing, and walking worsen the pain. Pt states she has tried a heating pad and a warm bath with no relief. She states she has been trying to get into pain management for her chronic back pain but has not had any luck. Pt states her PCP has given her Tramadol until she can get into pain management, which is not helping the pain currently. Pt denies chest pain, cough, sore throat, fever, chills, abdominal pain, nausea, emesis, diarrhea, urinary or bowel incontinence, vaginal discharge, weakness or numbness in legs as associated symptoms.   PCP is Dr. Della Goo   Past Medical History  Diagnosis Date  . Head ache   . Vertigo   . Back injury   . Gallstones   . Chronic back pain   . Migraine   . GERD (gastroesophageal reflux disease)   . Cancer     cervica-l leep  . Complication of anesthesia     possible aggression coming out of anesthesia   Past Surgical History  Procedure Laterality Date  . Hand tendon surgery      lft carpal tunnel , lig  . Leep    .  Tonsillectomy    . Breast surgery      rt fibroid 90. lft 94  . Cholecystectomy  07/16/2011    Procedure: LAPAROSCOPIC CHOLECYSTECTOMY WITH INTRAOPERATIVE CHOLANGIOGRAM;  Surgeon: Robyne Askew, MD;  Location: [facility];  Service: General;  Laterality: N/A;  . Hand surgery     Family History  Problem Relation Age of Onset  . Diabetes Maternal Grandmother   . Diabetes Paternal Grandmother    History  Substance Use Topics  . Smoking status: Never Smoker   . Smokeless tobacco: Not on file     Comment: smoked in 7yh grade, occ alcohol  . Alcohol Use: Yes     Comment: rarely   OB History   Grav Para Term Preterm Abortions TAB SAB Ect Mult Living                 Review of Systems  Constitutional: Negative for fever and chills.  HENT: Positive for neck pain. Negative for sore throat.   Respiratory: Negative for cough.   Cardiovascular: Positive for chest pain.  Gastrointestinal: Negative for nausea, vomiting, abdominal pain and diarrhea.  Genitourinary: Negative for dysuria, urgency, frequency, vaginal bleeding and vaginal discharge.  Musculoskeletal: Positive for myalgias and back pain.  Neurological: Negative for weakness and numbness.    Allergies  Flexeril; Gabapentin;  and Zofran  Home Medications   Current Outpatient Rx  Name  Route  Sig  Dispense  Refill  . acetaminophen (TYLENOL) 500 MG tablet   Oral   Take 500 mg by mouth every 6 (six) hours as needed for pain (migraine).         Marland Kitchen aspirin EC 81 MG tablet   Oral   Take 81 mg by mouth every 6 (six) hours as needed for pain (migraine).          . Ibuprofen-Diphenhydramine HCl (ADVIL PM) 200-25 MG CAPS   Oral   Take 1 tablet by mouth at bedtime as needed (pain).         Marland Kitchen oxyCODONE-acetaminophen (PERCOCET/ROXICET) 5-325 MG per tablet   Oral   Take 1 tablet by mouth every 4 (four) hours as needed for pain.   15 tablet   0   . promethazine (PHENERGAN) 25 MG tablet   Oral   Take 25 mg by mouth every 6  (six) hours as needed for nausea.         . traMADol (ULTRAM) 50 MG tablet   Oral   Take 50 mg by mouth every 6 (six) hours as needed for pain.         . zaleplon (SONATA) 5 MG capsule   Oral   Take 5 mg by mouth at bedtime as needed (sleep).          BP 121/83  Temp(Src) 98.3 F (36.8 C) (Oral)  Resp 18  SpO2 96%  LMP 09/06/2012  Physical Exam  Nursing note and vitals reviewed. Constitutional: She appears well-developed and well-nourished. No distress.  HENT:  Head: Normocephalic and atraumatic.  Neck: Neck supple.  Pulmonary/Chest: Effort normal.  Abdominal: Soft. She exhibits no distension and no mass. There is no tenderness. There is no rebound and no guarding.  obese  Musculoskeletal: Normal range of motion.       Cervical back: She exhibits no tenderness and no bony tenderness.       Thoracic back: She exhibits no tenderness and no bony tenderness.       Lumbar back: She exhibits tenderness. She exhibits no bony tenderness.       Back:  Spine nontender without crepitus or stepoffs.  Straight leg raise negative.   Lower extremities:  Strength 5/5, sensation intact, distal pulses intact.     Neurological: She is alert.  Skin: She is not diaphoretic.    ED Course  Procedures (including critical care time)  DIAGNOSTIC STUDIES: Oxygen Saturation is 96% on RA, normal by my interpretation.    COORDINATION OF CARE: 3:16 PM-Discussed treatment plan which includes xray and pain medication with pt at bedside and pt agreed to plan.   Labs Reviewed - No data to display Dg Lumbar Spine Complete  10/12/2012   *RADIOLOGY REPORT*  Clinical Data: Back pain  LUMBAR SPINE - COMPLETE 4+ VIEW  Comparison: 08/25/2011  Findings: Normal alignment of the lumbar spine.  The vertebral body heights are well preserved.  Mild disc space narrowing and ventral endplate spurring is noted consistent with degenerative disc disease.  There is no fracture or subluxation identified.   IMPRESSION:  Mild degenerative disc disease.   Original Report Authenticated By: Signa Kell, M.D.   1. Low back pain     MDM  Pt with chronic back pain and nontraumatic exacerbation 5 days ago p/w continued pain.  Pain is temporarily relieved with percocet.  Has not been able to f/u  with PCP as PCP "on leave" this week.  No red flags for back pain.  Xray shows mild DDD.  Pt feeling better after 1 norco.  This is likely muscular pain.  Pt also morbidly obese, so even without injury, may have mild muscle strain of back.  Pt d/c home with robaxin, PCP follow up.  Discussed all results with patient.  Pt given return precautions.  Pt verbalizes understanding and agrees with plan.          I personally performed the services described in this documentation, which was scribed in my presence. The recorded information has been reviewed and is accurate.   Wanda Dredge, PA-C 10/12/12 1806

## 2012-10-20 ENCOUNTER — Emergency Department (HOSPITAL_COMMUNITY)
Admission: EM | Admit: 2012-10-20 | Discharge: 2012-10-20 | Disposition: A | Discharge status: Home / Self Care | Payer: Medicaid Other | Attending: Emergency Medicine | Admitting: Emergency Medicine

## 2012-10-20 ENCOUNTER — Encounter (HOSPITAL_COMMUNITY): Payer: Self-pay | Admitting: Emergency Medicine

## 2012-10-20 DIAGNOSIS — Z8541 Personal history of malignant neoplasm of cervix uteri: Secondary | ICD-10-CM | POA: Insufficient documentation

## 2012-10-20 DIAGNOSIS — Z8679 Personal history of other diseases of the circulatory system: Secondary | ICD-10-CM | POA: Insufficient documentation

## 2012-10-20 DIAGNOSIS — Z8719 Personal history of other diseases of the digestive system: Secondary | ICD-10-CM | POA: Insufficient documentation

## 2012-10-20 DIAGNOSIS — Z87828 Personal history of other (healed) physical injury and trauma: Secondary | ICD-10-CM | POA: Insufficient documentation

## 2012-10-20 DIAGNOSIS — R42 Dizziness and giddiness: Secondary | ICD-10-CM | POA: Insufficient documentation

## 2012-10-20 DIAGNOSIS — R5383 Other fatigue: Secondary | ICD-10-CM | POA: Insufficient documentation

## 2012-10-20 DIAGNOSIS — R5381 Other malaise: Secondary | ICD-10-CM | POA: Insufficient documentation

## 2012-10-20 DIAGNOSIS — G8929 Other chronic pain: Secondary | ICD-10-CM | POA: Insufficient documentation

## 2012-10-20 DIAGNOSIS — R110 Nausea: Secondary | ICD-10-CM | POA: Insufficient documentation

## 2012-10-20 DIAGNOSIS — R51 Headache: Secondary | ICD-10-CM | POA: Insufficient documentation

## 2012-10-20 LAB — CBC WITH DIFFERENTIAL/PLATELET
Basophils Relative: 1 % (ref 0–1)
Hemoglobin: 15.2 g/dL — ABNORMAL HIGH (ref 12.0–15.0)
Lymphocytes Relative: 33 % (ref 12–46)
Lymphs Abs: 1.9 10*3/uL (ref 0.7–4.0)
Monocytes Relative: 8 % (ref 3–12)
Neutro Abs: 3.2 10*3/uL (ref 1.7–7.7)
Neutrophils Relative %: 55 % (ref 43–77)
RBC: 5.02 MIL/uL (ref 3.87–5.11)

## 2012-10-20 LAB — COMPREHENSIVE METABOLIC PANEL
CO2: 27 mEq/L (ref 19–32)
Calcium: 9.5 mg/dL (ref 8.4–10.5)
Creatinine, Ser: 0.79 mg/dL (ref 0.50–1.10)
GFR calc Af Amer: >90 mL/min (ref >90)
GFR calc non Af Amer: >90 mL/min (ref >90)
Glucose, Bld: 110 mg/dL — ABNORMAL HIGH (ref 70–99)

## 2012-10-20 MED ORDER — MECLIZINE HCL 25 MG PO TABS
25.0000 mg | ORAL_TABLET | Freq: Once | ORAL | Status: AC
  Administered 2012-10-20: 25 mg via ORAL
  Filled 2012-10-20: qty 1

## 2012-10-20 MED ORDER — MECLIZINE HCL 25 MG PO TABS: 25.0000 mg | ORAL_TABLET | Freq: Three times a day (TID) | ORAL | Status: DC | PRN

## 2012-10-20 NOTE — ED Notes (Signed)
Pt c/o dizziness and nausea; pt with hx of vertigo and feels same; pt sts started upon waking this am

## 2012-10-20 NOTE — ED Provider Notes (Signed)
CSN: [id number]     Arrival date & time 10/20/12  1626 History     First MD Initiated Contact with Patient 10/20/12 1637     Chief Complaint  Patient presents with  . Dizziness  . Nausea   (Consider location/radiation/quality/duration/timing/severity/associated sxs/prior Treatment) HPI Comments: Patient presents with complaints of dizziness for the past several days.  She describes the dizziness as a spinning sensation and are worse with position, movement, turning head.  She reports a mild headache but no history of injury or trauma.    She has a history of vertigo and this feels the same.    Patient is a 47 y.o. female presenting with weakness. The history is provided by the patient.  Weakness This is a new problem. The current episode started 2 days ago. The problem occurs constantly. The problem has been gradually worsening. Associated symptoms include headaches. Pertinent negatives include no chest pain and no abdominal pain. Exacerbated by: movement, turning head. Nothing relieves the symptoms. She has tried nothing for the symptoms. The treatment provided no relief.    Past Medical History  Diagnosis Date  . Head ache   . Vertigo   . Back injury   . Gallstones   . Chronic back pain   . Migraine   . GERD (gastroesophageal reflux disease)   . Cancer     cervica-l leep  . Complication of anesthesia     possible aggression coming out of anesthesia   Past Surgical History  Procedure Laterality Date  . Hand tendon surgery      lft carpal tunnel , lig  . Leep    . Tonsillectomy    . Breast surgery      rt fibroid 90. lft 94  . Cholecystectomy  07/16/2011    Procedure: LAPAROSCOPIC CHOLECYSTECTOMY WITH INTRAOPERATIVE CHOLANGIOGRAM;  Surgeon: Robyne Askew, MD;  Location: [facility] OR;  Service: General;  Laterality: N/A;  . Hand surgery     Family History  Problem Relation Age of Onset  . Diabetes Maternal Grandmother   . Diabetes Paternal Grandmother    History   Substance Use Topics  . Smoking status: Never Smoker   . Smokeless tobacco: Not on file     Comment: smoked in 7yh grade, occ alcohol  . Alcohol Use: Yes     Comment: rarely   OB History   Grav Para Term Preterm Abortions TAB SAB Ect Mult Living                 Review of Systems  Cardiovascular: Negative for chest pain.  Gastrointestinal: Negative for abdominal pain.  Neurological: Positive for weakness and headaches.  All other systems reviewed and are negative.    Allergies  Flexeril; Gabapentin; and Zofran  Home Medications   Current Outpatient Rx  Name  Route  Sig  Dispense  Refill  . acetaminophen (TYLENOL) 500 MG tablet   Oral   Take 500 mg by mouth every 6 (six) hours as needed for pain (migraine).         Marland Kitchen aspirin EC 81 MG tablet   Oral   Take 81 mg by mouth every 6 (six) hours as needed for pain. For migraines         . Ibuprofen-Diphenhydramine HCl (ADVIL PM) 200-25 MG CAPS   Oral   Take 1 tablet by mouth at bedtime as needed (pain).         . meclizine (ANTIVERT) 25 MG tablet   Oral  Take 25 mg by mouth 3 (three) times daily as needed for dizziness. For dizziness         . methocarbamol (ROBAXIN) 500 MG tablet   Oral   Take 1 tablet (500 mg total) by mouth 4 (four) times daily as needed.   20 tablet   0   . oxyCODONE-acetaminophen (PERCOCET/ROXICET) 5-325 MG per tablet   Oral   Take 1 tablet by mouth every 4 (four) hours as needed for pain.   15 tablet   0   . traMADol (ULTRAM) 50 MG tablet   Oral   Take 100 mg by mouth every 6 (six) hours.           BP 100/59  Pulse 85  Temp(Src) 98.2 F (36.8 C) (Oral)  Resp 16  SpO2 97%  LMP 09/06/2012 Physical Exam  Nursing note and vitals reviewed. Constitutional: She is oriented to person, place, and time. She appears well-developed and well-nourished. No distress.  HENT:  Head: Normocephalic and atraumatic.  Eyes: Pupils are equal, round, and reactive to light.  Neck: Normal  range of motion. Neck supple.  Cardiovascular: Normal rate and regular rhythm.  Exam reveals no gallop and no friction rub.   No murmur heard. Pulmonary/Chest: Effort normal and breath sounds normal. No respiratory distress. She has no wheezes.  Abdominal: Soft. Bowel sounds are normal. She exhibits no distension. There is no tenderness.  Musculoskeletal: Normal range of motion.  Neurological: She is alert and oriented to person, place, and time. No cranial nerve deficit. She exhibits normal muscle tone. Coordination normal.  Skin: Skin is warm and dry. She is not diaphoretic.    ED Course   Procedures (including critical care time)  Labs Reviewed  COMPREHENSIVE METABOLIC PANEL  CBC WITH DIFFERENTIAL   No results found. No diagnosis found.   Date: 10/20/2012  Rate: 79  Rhythm: normal sinus rhythm  QRS Axis: normal  Intervals: normal  ST/T Wave abnormalities: normal  Conduction Disutrbances:none  Narrative Interpretation:   Old EKG Reviewed: none available    MDM  The ekg and labs are unremarkable.  She is feeling better with meclizine.  Will discharge to home.  Return prn.   Geoffery Lyons, MD 10/20/12 [phone]

## 2012-10-20 NOTE — ED Notes (Signed)
Pt comfortable with d/c and f/u instructions. Prescriptions x1 

## 2012-11-05 ENCOUNTER — Emergency Department (HOSPITAL_COMMUNITY)
Admission: EM | Admit: 2012-11-05 | Discharge: 2012-11-05 | Disposition: A | Discharge status: Home / Self Care | Payer: Medicaid Other | Source: Home / Self Care

## 2012-11-05 ENCOUNTER — Encounter (HOSPITAL_COMMUNITY): Payer: Self-pay | Admitting: *Deleted

## 2012-11-05 DIAGNOSIS — M545 Low back pain, unspecified: Secondary | ICD-10-CM

## 2012-11-05 MED ORDER — AMITRIPTYLINE HCL 25 MG PO TABS: 25.0000 mg | ORAL_TABLET | Freq: Every evening | ORAL | Status: DC | PRN

## 2012-11-05 MED ORDER — HYDROCODONE-ACETAMINOPHEN 5-325 MG PO TABS: 1.0000 | ORAL_TABLET | Freq: Four times a day (QID) | ORAL | Status: DC | PRN

## 2012-11-05 MED ORDER — KETOROLAC TROMETHAMINE 60 MG/2ML IM SOLN
60.0000 mg | Freq: Once | INTRAMUSCULAR | Status: AC
  Administered 2012-11-05: 60 mg via INTRAMUSCULAR

## 2012-11-05 MED ORDER — KETOROLAC TROMETHAMINE 60 MG/2ML IM SOLN
INTRAMUSCULAR | Status: AC
  Filled 2012-11-05: qty 2

## 2012-11-05 NOTE — ED Notes (Signed)
Reports chronic low back pain x 4 yrs; was seen at [facility] Friday and told she has degenerative arthritis.  Has been taking Tramadol; flare-up started last night.  Denies parasthesias; pain radiating into bilat hips and groin.

## 2012-11-05 NOTE — ED Provider Notes (Signed)
Wanda Mcintosh is a 47 y.o. female who presents to Urgent Care today for exacerbation of chronic back pain. Patient has had about 4 years of chronic back pain and is currently worsened. On Friday she was seen by Dr.Liu at [facility]. Dr. Verdie Mosher is a PM&R physician he specializes in back pain. She has a scheduled facet injection and ablation for August 29. However Wanda Mcintosh has had worsening back pain since the exam. She thinks perhaps the extra motion she was asked to do during exam has exacerbated her pain. She denies any pain radiating below her buttocks weakness numbness difficulty walking bowel or bladder dysfunction. Back pain is worse with activity and extension. She notes that the pain is quite severe and tramadol is not effective. She feels well otherwise.     PMH reviewed. Chronic back pain History  Substance Use Topics  . Smoking status: Never Smoker   . Smokeless tobacco: Not on file     Comment: smoked in 7yh grade, occ alcohol  . Alcohol Use: Yes     Comment: occasional   ROS as above Medications reviewed. Patient notes that Flexeril tends to give her a headache. Current Facility-Administered Medications  Medication Dose Route Frequency Provider Last Rate Last Dose  . ketorolac (TORADOL) injection 60 mg  60 mg Intramuscular Once Rodolph Bong, MD       Current Outpatient Prescriptions  Medication Sig Dispense Refill  . traMADol (ULTRAM) 50 MG tablet Take 100 mg by mouth every 6 (six) hours.       Marland Kitchen amitriptyline (ELAVIL) 25 MG tablet Take 1 tablet (25 mg total) by mouth at bedtime as needed for pain.  30 tablet  0  . HYDROcodone-acetaminophen (NORCO/VICODIN) 5-325 MG per tablet Take 1 tablet by mouth every 6 (six) hours as needed for pain.  20 tablet  0    Exam:  BP 106/75  Pulse 80  Temp(Src) 97.9 F (36.6 C) (Oral)  Resp 19  SpO2 98%  LMP 10/29/2012 Gen: Well NAD, in pain appearing HEENT: EOMI,  MMM Lungs: CTABL Nl WOB Heart: RRR no MRG Abd: NABS, NT, ND Exts: Non  edematous BL  LE, warm and well perfused.  Back: Nontender to spinal midline. Tender to palpation bilateral lumbar paraspinal muscles and SI joints. Patient has decreased flexion and decreased extension. Sensation and strength is intact bilateral lower extremities. Patient can stand on her heels and toes and has a normal gait  No results found for this or any previous visit (from the past 24 hour(s)). No results found.  Assessment and Plan: 47 y.o. female with lumbar pain and SI joint pain exacerbation of chronic pain.  I reviewed the records from [facility] using care everywhere system.  Dr Theodora Blow plan makes sense.  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  Plan to use Toradol injection in the clinic today followed by small amount of hydrocodone (20 tablets) and amitriptyline at night.  She will followup with her physical medicine and rehabilitation Dr. on the 29th.  Discussed warning signs or symptoms. Please see discharge instructions. Patient expresses understanding.      Rodolph Bong, MD 11/05/12 [phone]

## 2013-01-25 ENCOUNTER — Other Ambulatory Visit: Payer: Self-pay

## 2013-08-21 IMAGING — US US ABDOMEN COMPLETE
1 series · 14 of 25 positions shown · non-contrast
Comparison: 12/13/2010

CLINICAL DATA: Right upper abdominal pain.  Cholelithiasis.

COMPLETE ABDOMINAL ULTRASOUND

[Series 1: us abdomen complete · 0.35mm/px · 14 of 59 slices shown]
[im 1/59]
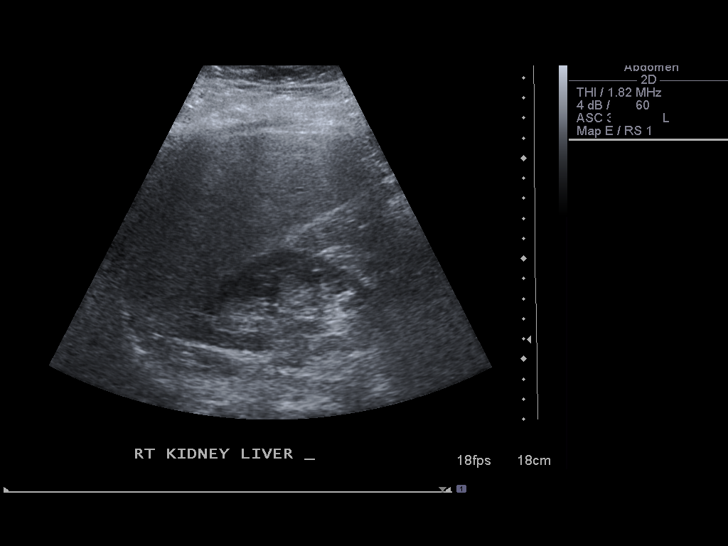
[im 5/59]
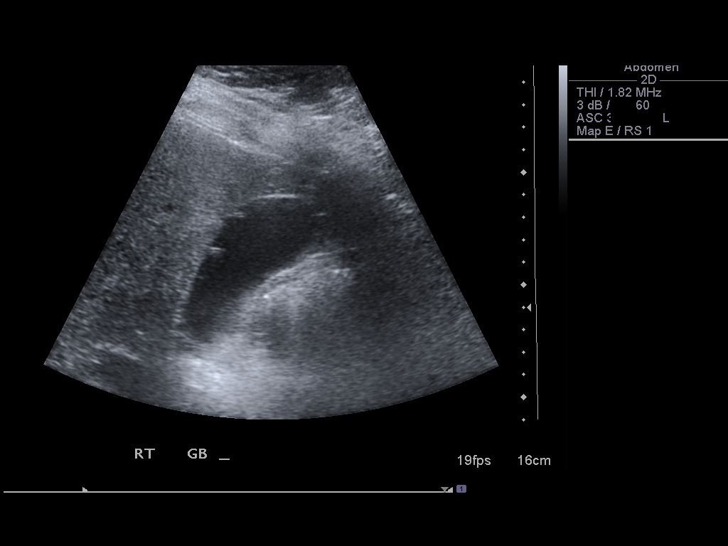
[im 10/59]
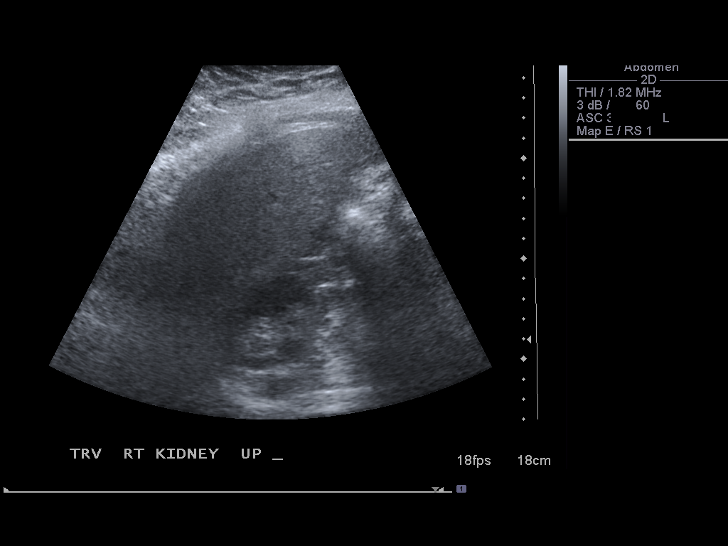
[im 15/59]
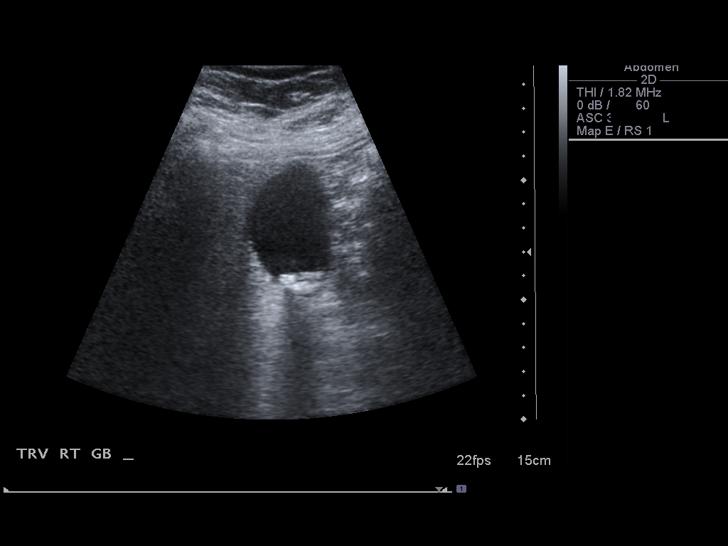
[im 20/59]
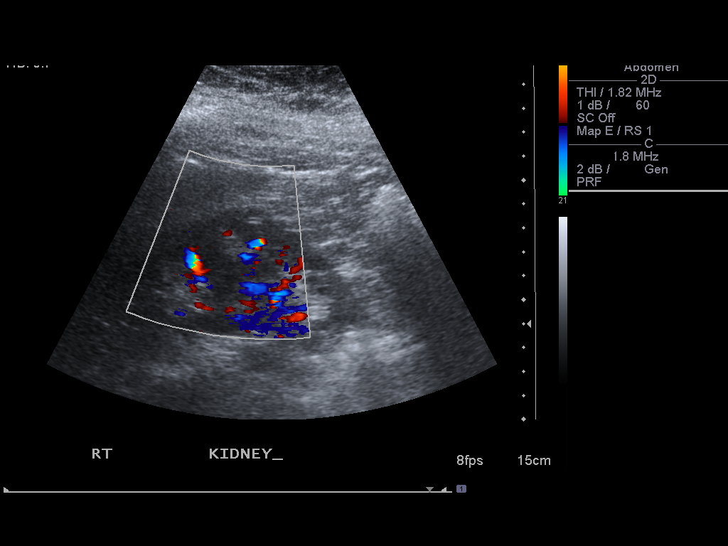
[im 22/59]
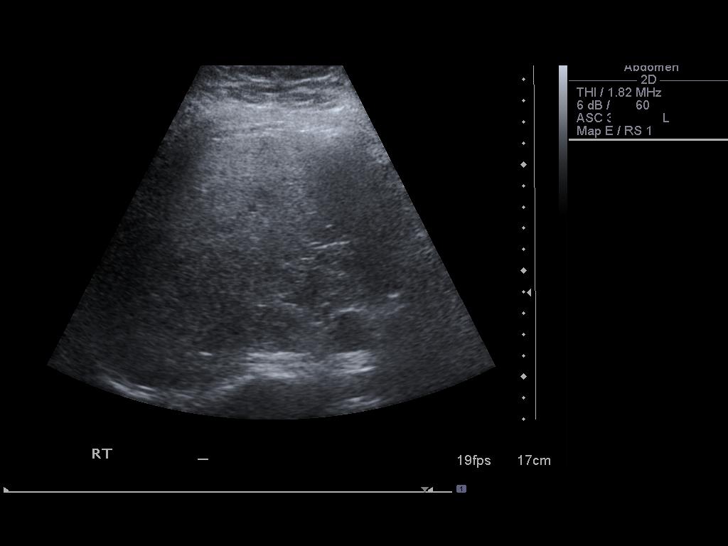
[im 27/59]
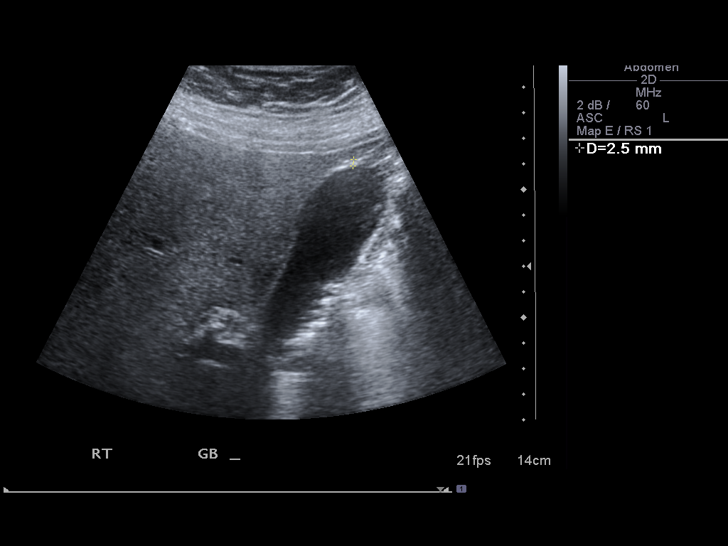
[im 32/59]
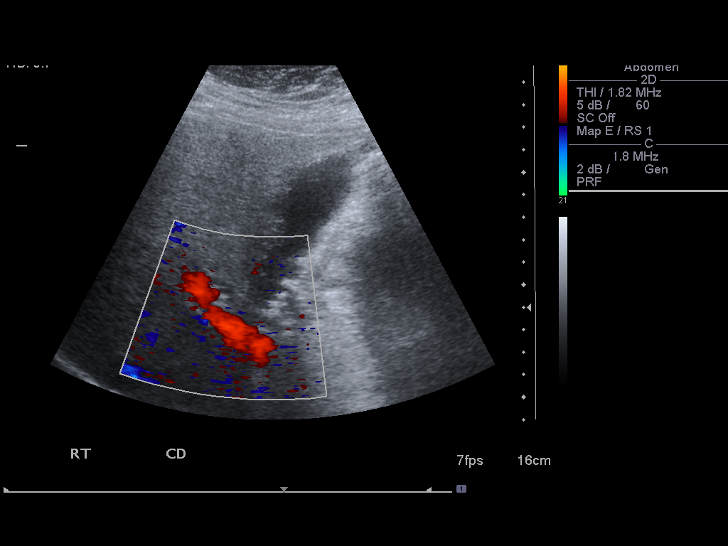
[im 37/59]
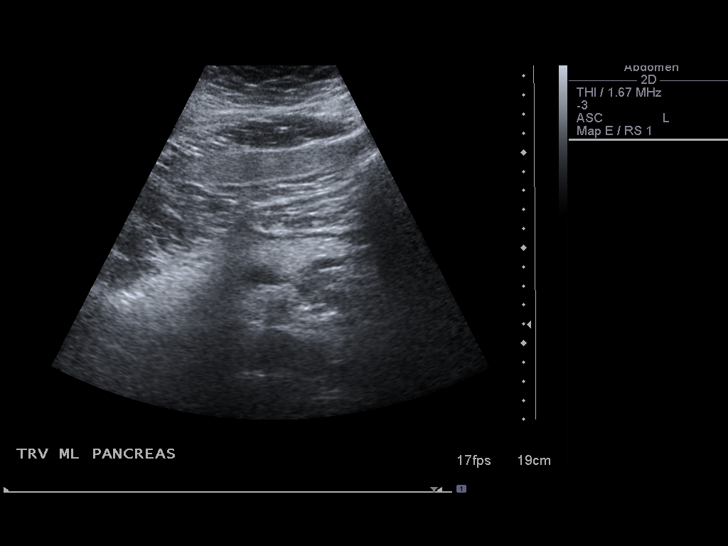
[im 39/59]
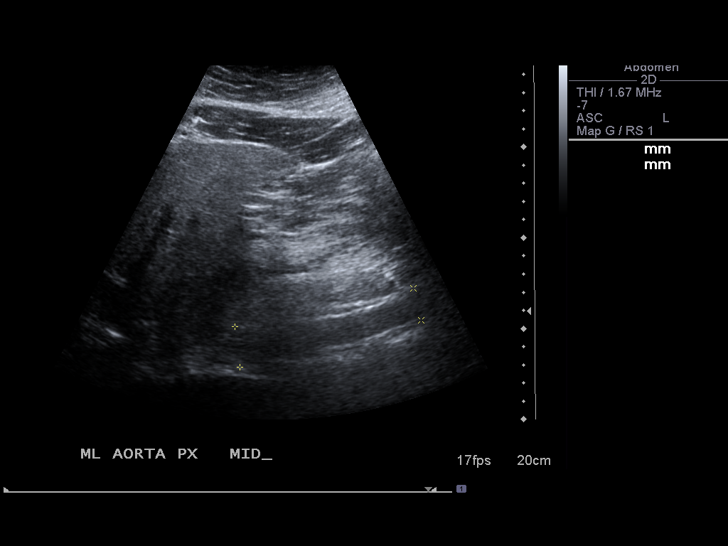
[im 44/59]
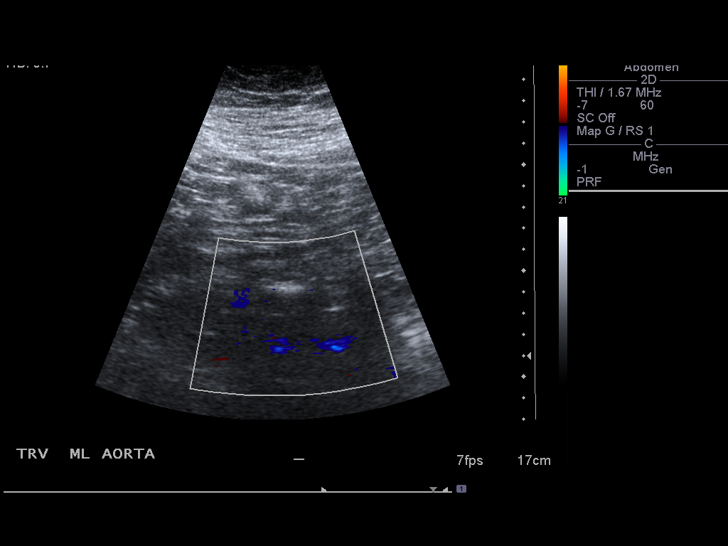
[im 49/59]
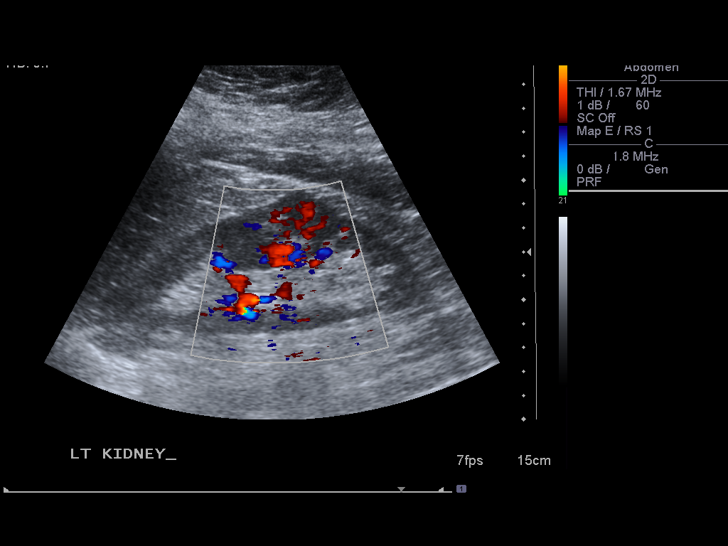
[im 54/59]
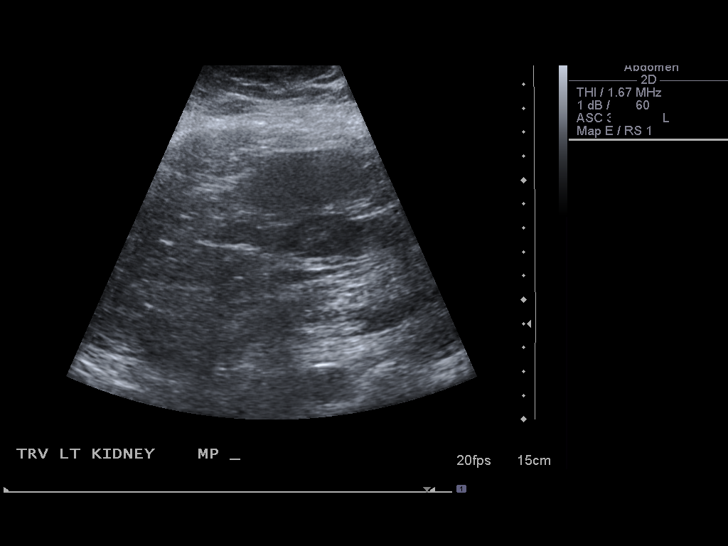
[im 59/59]
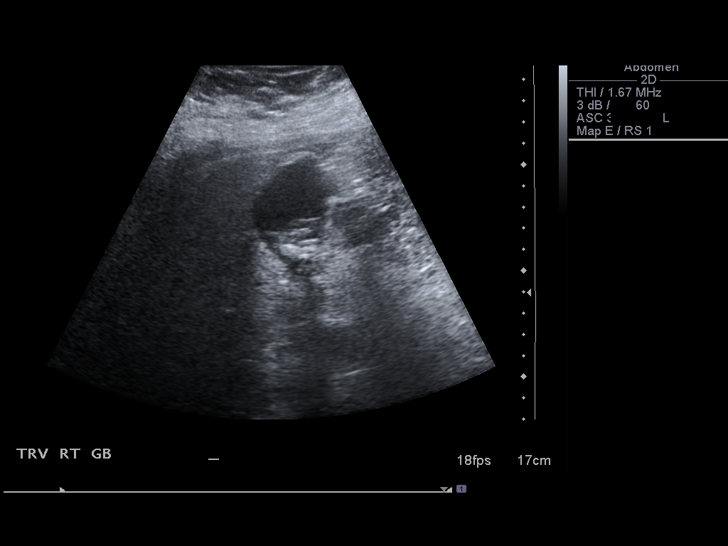

[14 of 25 positions shown; findings below may reference images not displayed]

FINDINGS: Gallbladder:  Multiple subcentimeter stones layer in the dependent
aspect of the gallbladder.  There is mild gallbladder wall
thickening up to 6 mm.  No pericholecystic fluid.  Sonographer
reports no sonographic Murphy's sign.

Common bile duct:  5 mm diameter, unremarkable

Liver:  No focal lesion identified.  Within normal limits in
parenchymal echogenicity.

IVC:  Appears normal.

Pancreas:  No focal abnormality seen, portions of the tail obscured
by overlying bowel gas.

Spleen:  10 cm craniocaudal length, unremarkable

Right Kidney:  11.5 cm. No hydronephrosis.  Well-preserved cortex.
Normal size and parenchymal echotexture without focal
abnormalities.

Left Kidney:  14 cm. No hydronephrosis.  Well-preserved cortex.
Normal size and parenchymal echotexture without focal
abnormalities.

Abdominal aorta:  No aneurysm identified.
IMPRESSION: 1.  Cholelithiasis with mild gallbladder wall thickening but no
other ultrasound findings to suggest cholecystitis.

## 2014-05-10 ENCOUNTER — Inpatient Hospital Stay (HOSPITAL_COMMUNITY)
Admission: AD | Admit: 2014-05-10 | Discharge: 2014-05-11 | Disposition: A | Discharge status: Home / Self Care | Payer: Medicare Other | Source: Ambulatory Visit | Attending: Obstetrics & Gynecology | Admitting: Obstetrics & Gynecology

## 2014-05-10 ENCOUNTER — Encounter (HOSPITAL_COMMUNITY): Payer: Self-pay | Admitting: *Deleted

## 2014-05-10 DIAGNOSIS — A6000 Herpesviral infection of urogenital system, unspecified: Secondary | ICD-10-CM | POA: Diagnosis not present

## 2014-05-10 DIAGNOSIS — N949 Unspecified condition associated with female genital organs and menstrual cycle: Secondary | ICD-10-CM | POA: Insufficient documentation

## 2014-05-10 LAB — URINE MICROSCOPIC-ADD ON

## 2014-05-10 LAB — WET PREP, GENITAL
Trich, Wet Prep: NONE SEEN
Yeast Wet Prep HPF POC: NONE SEEN

## 2014-05-10 LAB — URINALYSIS, ROUTINE W REFLEX MICROSCOPIC
BILIRUBIN URINE: NEGATIVE
Glucose, UA: NEGATIVE mg/dL
KETONES UR: NEGATIVE mg/dL
NITRITE: NEGATIVE
PROTEIN: NEGATIVE mg/dL
UROBILINOGEN UA: 0.2 mg/dL (ref 0.0–1.0)
pH: 5.5 (ref 5.0–8.0)

## 2014-05-10 LAB — POCT PREGNANCY, URINE: PREG TEST UR: NEGATIVE

## 2014-05-10 MED ORDER — VALACYCLOVIR HCL 1 G PO TABS: 1000.0000 mg | ORAL_TABLET | Freq: Two times a day (BID) | ORAL | Status: AC
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
## 2014-05-10 NOTE — Discharge Instructions (Signed)
Herpes Simplex Herpes simplex is generally classified as Type 1 or Type 2. Type 1 is generally the type that is responsible for cold sores. Type 2 is generally associated with sexually transmitted diseases. We now know that most of the thoughts on these viruses are inaccurate. We find that HSV1 is also present genitally and HSV2 can be present orally, but this will vary in different locations of the world. Herpes simplex is usually detected by doing a culture. Blood tests are also available for this virus; however, the accuracy is often not as good.  PREPARATION FOR TEST No preparation or fasting is necessary. NORMAL FINDINGS  No virus present  No HSV antigens or antibodies present Ranges for normal findings may vary among different laboratories and hospitals. You should always check with your doctor after having lab work or other tests done to discuss the meaning of your test results and whether your values are considered within normal limits. MEANING OF TEST  Your caregiver will go over the test results with you and discuss the importance and meaning of your results, as well as treatment options and the need for additional tests if necessary. OBTAINING THE TEST RESULTS  It is your responsibility to obtain your test results. Ask the lab or department performing the test when and how you will get your results. Document Released: 04/10/2004 Document Revised: 05/31/2011 Document Reviewed: 02/17/2008 [facility] Patient Information 2015 [place], [name]. This information is not intended to replace advice given to you by your health care provider. Make sure you discuss any questions you have with your health care provider.  Genital Herpes Genital herpes is a sexually transmitted disease. This means that it is a disease passed by having sex with an infected person. There is no cure for genital herpes. The time between attacks can be months to years. The virus may live in a person but produce no problems  (symptoms). This infection can be passed to a baby as it travels down the birth canal (vagina). In a newborn, this can cause central nervous system damage, eye damage, or even death. The virus that causes genital herpes is usually HSV-2 virus. The virus that causes oral herpes is usually HSV-1. The diagnosis (learning what is wrong) is made through culture results. SYMPTOMS  Usually symptoms of pain and itching begin a few days to a week after contact. It first appears as small blisters that progress to small painful ulcers which then scab over and heal after several days. It affects the outer genitalia, birth canal, cervix, penis, anal area, buttocks, and thighs. HOME CARE INSTRUCTIONS   Keep ulcerated areas dry and clean.  Take medications as directed. Antiviral medications can speed up healing. They will not prevent recurrences or cure this infection. These medications can also be taken for suppression if there are frequent recurrences.  While the infection is active, it is contagious. Avoid all sexual contact during active infections.  Condoms may help prevent spread of the herpes virus.  Practice safe sex.  Wash your hands thoroughly after touching the genital area.  Avoid touching your eyes after touching your genital area.  Inform your caregiver if you have had genital herpes and become pregnant. It is your responsibility to insure a safe outcome for your baby in this pregnancy.  Only take over-the-counter or prescription medicines for pain, discomfort, or fever as directed by your caregiver. SEEK MEDICAL CARE IF:   You have a recurrence of this infection.  You do not respond to medications and are  not improving.  You have new sources of pain or discharge which have changed from the original infection.  You have an oral temperature above 102 F (38.9 C).  You develop abdominal pain.  You develop eye pain or signs of eye infection. Document Released: 03/05/2000 Document  Revised: 05/31/2011 Document Reviewed: 03/26/2009 [facility] Patient Information 2015 [place], [name]. This information is not intended to replace advice given to you by your health care provider. Make sure you discuss any questions you have with your health care provider.

## 2014-05-12 LAB — RPR: RPR Ser Ql: NONREACTIVE

## 2014-05-13 LAB — HIV ANTIBODY (ROUTINE TESTING W REFLEX): HIV SCREEN 4TH GENERATION: NONREACTIVE

## 2014-05-13 LAB — GC/CHLAMYDIA PROBE AMP (~~LOC~~) NOT AT ARMC
Chlamydia: NEGATIVE
Neisseria Gonorrhea: NEGATIVE

## 2014-05-13 LAB — HERPES SIMPLEX VIRUS CULTURE: Special Requests: NORMAL

## 2016-04-16 ENCOUNTER — Encounter: Payer: Self-pay | Admitting: Physical Medicine & Rehabilitation

## 2016-04-16 ENCOUNTER — Encounter: Payer: Medicare HMO | Attending: Physical Medicine & Rehabilitation | Admitting: Physical Medicine & Rehabilitation

## 2016-04-16 VITALS — BP 120/80 | HR 93 | Resp 14

## 2016-04-16 DIAGNOSIS — Z5181 Encounter for therapeutic drug level monitoring: Secondary | ICD-10-CM

## 2016-04-16 DIAGNOSIS — M545 Low back pain: Secondary | ICD-10-CM | POA: Diagnosis not present

## 2016-04-16 DIAGNOSIS — G479 Sleep disorder, unspecified: Secondary | ICD-10-CM | POA: Diagnosis not present

## 2016-04-16 DIAGNOSIS — M461 Sacroiliitis, not elsewhere classified: Secondary | ICD-10-CM | POA: Insufficient documentation

## 2016-04-16 DIAGNOSIS — G629 Polyneuropathy, unspecified: Secondary | ICD-10-CM | POA: Diagnosis not present

## 2016-04-16 DIAGNOSIS — M533 Sacrococcygeal disorders, not elsewhere classified: Secondary | ICD-10-CM | POA: Insufficient documentation

## 2016-04-16 DIAGNOSIS — Z79899 Other long term (current) drug therapy: Secondary | ICD-10-CM

## 2016-04-16 DIAGNOSIS — G894 Chronic pain syndrome: Secondary | ICD-10-CM | POA: Diagnosis present

## 2016-04-16 DIAGNOSIS — M792 Neuralgia and neuritis, unspecified: Secondary | ICD-10-CM

## 2016-04-16 DIAGNOSIS — R42 Dizziness and giddiness: Secondary | ICD-10-CM | POA: Insufficient documentation

## 2016-04-16 DIAGNOSIS — M791 Myalgia, unspecified site: Secondary | ICD-10-CM

## 2016-04-16 DIAGNOSIS — G43909 Migraine, unspecified, not intractable, without status migrainosus: Secondary | ICD-10-CM | POA: Diagnosis not present

## 2016-04-16 DIAGNOSIS — G8929 Other chronic pain: Secondary | ICD-10-CM

## 2016-04-16 MED ORDER — AMITRIPTYLINE HCL 50 MG PO TABS: 50.0000 mg | ORAL_TABLET | Freq: Every day | ORAL | 1 refills | Status: DC

## 2016-04-16 MED ORDER — METHOCARBAMOL 500 MG PO TABS: 500.0000 mg | ORAL_TABLET | Freq: Three times a day (TID) | ORAL | 1 refills | Status: DC | PRN

## 2016-04-16 MED ORDER — DICLOFENAC SODIUM 75 MG PO TBEC: 75.0000 mg | DELAYED_RELEASE_TABLET | Freq: Two times a day (BID) | ORAL | 0 refills | Status: DC

## 2016-04-16 NOTE — Addendum Note (Signed)
Addended by: Geryl Rankins D on: 04/16/2016 02:47 PM   Modules accepted: Orders

## 2016-04-16 NOTE — Progress Notes (Signed)
Subjective:    Patient ID: Wanda Mcintosh, female    DOB: [birth year], 51 y.o.   MRN: [id number]  HPI  51 y/o female with pmh of chronic pain syndrome, b/l SI joint pain, migraine, vertigo presents with > low back pain.  Started 12/2008, progressively getting worse after a fall.  Heat improves the pain.  Standing, prolonged walking.  Dull and intense.  Non-radiating.  Constant.  Denies associated weakness and numbness.  Had steroid injections in SI.  Lumbar ESIs and RFAs do not last.  Hydrocodone helps. Mechanical fall in November.  Pain limits cooking and cleaning.   Pt current on disability.  Pain Inventory Average Pain 4 Pain Right Now 4 My pain is dull and aching  In the last 24 hours, has pain interfered with the following? General activity 4 Relation with others 4 Enjoyment of life 4 What TIME of day is your pain at its worst? varies with activity Sleep (in general) Poor  Pain is worse with: walking, bending and standing Pain improves with: heat/ice, medication and injections Relief from Meds: 2  Mobility walk with assistance how many minutes can you walk? 30 ability to climb steps?  yes do you drive?  yes Do you have any goals in this area?  yes  Function disabled: date disabled . I need assistance with the following:  household duties and shopping Do you have any goals in this area?  yes  Neuro/Psych No problems in this area  Prior Studies Any changes since last visit?  no  Physicians involved in your care Any changes since last visit?  no   Family History  Problem Relation Age of Onset  . Diabetes Maternal Grandmother   . Diabetes Paternal Grandmother    Social History   Social History  . Marital status: Single    Spouse name: N/A  . Number of children: N/A  . Years of education: N/A   Social History Main Topics  . Smoking status: Never Smoker  . Smokeless tobacco: Never Used     Comment: smoked in 7yh grade, occ alcohol  . Alcohol use Yes       Comment: occasional  . Drug use: No  . Sexual activity: Yes    Birth control/ protection: Coitus interruptus   Other Topics Concern  . None   Social History Narrative  . None   Past Surgical History:  Procedure Laterality Date  . BREAST SURGERY     rt fibroid 90. lft 94  . CHOLECYSTECTOMY  07/16/2011   Procedure: LAPAROSCOPIC CHOLECYSTECTOMY WITH INTRAOPERATIVE CHOLANGIOGRAM;  Surgeon: Merrie Roof, MD;  Location: [place];  Service: General;  Laterality: N/A;  . HAND SURGERY    . HAND TENDON SURGERY     lft carpal tunnel , lig  . LEEP    . TONSILLECTOMY     Past Medical History:  Diagnosis Date  . Back injury   . Cancer ([place])    cervica-l leep  . Chronic back pain   . Complication of anesthesia    possible aggression coming out of anesthesia  . Gallstones   . GERD (gastroesophageal reflux disease)   . Head ache   . Migraine   . Vertigo    BP 120/80   Pulse 93   Resp 14   SpO2 96%   Opioid Risk Score:   Fall Risk Score:  `1  Depression screen PHQ 2/9  Depression screen PHQ 2/9 04/16/2016  Decreased Interest 0  Down, Depressed,  Hopeless 0  PHQ - 2 Score 0  Altered sleeping 3  Tired, decreased energy 2  Change in appetite 0  Feeling bad or failure about yourself  1  Trouble concentrating 0  Moving slowly or fidgety/restless 0  Suicidal thoughts 0  PHQ-9 Score 6  Difficult doing work/chores Somewhat difficult    Review of Systems  Constitutional: Negative.   HENT: Negative.   Eyes: Negative.   Respiratory: Negative.   Cardiovascular: Negative.   Gastrointestinal: Negative.   Endocrine: Negative.   Genitourinary: Positive for pelvic pain.  Musculoskeletal: Positive for back pain.  Skin: Negative.   Allergic/Immunologic: Negative.   Neurological: Negative.   Hematological: Negative.   Psychiatric/Behavioral: Negative.       Objective:   Physical Exam Gen: NAD. Vital signs reviewed. Obese HENT: Normocephalic, Atraumatic Eyes: EOMI, No  discharge.  Cardio: RRR. No JVD. Pulm: B/l clear to auscultation.  Effort normal Abd: Soft, BS+ MSK:  Gait slightly antalgic, but able to perform heel/toe walking without difficulty  TTP left thoracolumbar PSPs, left SI joint  No edema.   +FABERs left, neg right  Neg fortin finger, neg SI compression Neuro: CN II-XII grossly intact.    Sensation intact to light touch in all LE dermatomes  Reflexes 2+ throughout  Strength  5/5 in all LE myotomes  Neg SLR bilaterally Skin: Warm and Dry. Intact.    Assessment & Plan:  51 y/o female with pmh of chronic pain syndrome, b/l SI joint pain, migraine, vertigo presents with > low back pain.  1. Chronic low back pain  Referral information reviewed  NCCSRS reviewed  UDS obtained  Labs reviewed  MRI results reviewed 03/2015, showing lumbar facet arthropathy  Did not respond to ESIs and RFA at [place] does not receive benefit from TENS  Cannot tolerate Mobic, Cymbalta, Gabapentin  Cont heat  Pt had PT in 2012, will consider referral in future  Encouraged pool therapy  Robaxin 500 TID PRN ordered  Elavil 50mg  qhs ordered  Will order Diclofenac  Will consider Lyrica, but given hx of edema with other meds, will try to avoid   Will consider topical meds in future   2. Sleep disturbance  Will trial to 50mg  qhS  3. Peripheral neuropathy  Currently being workup by PCP  Will consider lidoderm ointment on next visit  4. Morbid obesity  Will refer to [place]   5. B/l sacroiliitis, L>R  Currently receiving injections at [facility]  Will order bracing during periods of increased activity  6. Myalgias  Will consider trigger point injections on next visit

## 2016-04-23 LAB — TOXASSURE SELECT,+ANTIDEPR,UR

## 2016-04-23 NOTE — Progress Notes (Signed)
Urine drug screen for this encounter is consistent for prescribed medication 

## 2016-05-12 ENCOUNTER — Ambulatory Visit: Payer: Medicare HMO | Admitting: Registered"

## 2016-05-14 ENCOUNTER — Encounter: Payer: Medicare HMO | Admitting: Physical Medicine & Rehabilitation

## 2016-05-17 ENCOUNTER — Other Ambulatory Visit: Payer: Self-pay | Admitting: Physical Medicine & Rehabilitation

## 2016-05-20 ENCOUNTER — Encounter: Payer: Medicare HMO | Attending: Physical Medicine & Rehabilitation | Admitting: Registered"

## 2016-05-20 DIAGNOSIS — E6609 Other obesity due to excess calories: Secondary | ICD-10-CM

## 2016-05-20 DIAGNOSIS — Z713 Dietary counseling and surveillance: Secondary | ICD-10-CM | POA: Insufficient documentation

## 2016-05-20 NOTE — Progress Notes (Signed)
Medical Nutrition Therapy:  Appt start time: 1115 end time:  1215.   Assessment:  Primary concerns today: Patient is here today because her doctor requested she see a nutritionist for healthy eating and weight loss.   Stays close to 245-255. She wants to get down to 200-210 lbs.  Son is 21 lives with her. 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 She states it is hard for her to eat right because she cannot afford to buy healthy food. Patient states she is getting a garden spot ready and will be getting more variety of vegetables in her diet. She also states she likes fishing.  Preferred Learning Style:   No preference indicated   Learning Readiness:   Contemplating  MEDICATIONS: reviewed   DIETARY INTAKE:  Usual eating pattern includes 1-2 meals and  snacks per day.  Avoided foods include Artificial sweeteners and MSG because they cause migraines.   24-hr recall:  B ( AM): banana with PB OR cereal and milk OR Oatmeal Snk ( AM): none  L ( PM): none OR banana PB Snk ( PM): none or nuts D ( PM): asparagus, steak  Snk ( PM): corn on the cob popcorn Beverages: water, lemonade powder, occasionally soda  Usual physical activity: Before car broke down was going to [employer]. Walking more d/t relying on public transportation. Patient reports she is just recovering from being sick for 10 days.   Estimated energy needs: 1600 calories 180 g carbohydrates 120 g protein 44 g fat  Progress Towards Goal(s):  In progress.   Nutritional Diagnosis:  [place]-3.3 Overweight/obesity As related to food insecurity, eating irregular meals .  As evidenced by diet recall, pt stating cannot afford to buy enough food for herself and son, believes healthy food is too expensive, and BMI of 38.    Intervention:  Nutrition Education. Discussed importance of balanced meals and options for affordable healthy protein.   Teaching Method Utilized:   Visual Auditory  Handouts given during visit include: (printer not working, sent via email)  [employer] / [employer] benefits  Food Pantries list  My Plate on a Budget  Barriers to learning/adherence to lifestyle change: food insecure  Demonstrated degree of understanding via:  Teach Back   Monitoring/Evaluation:  Dietary intake, exercise, access to affordable food, and body weight prn.

## 2016-05-20 NOTE — Patient Instructions (Addendum)
Cooking Matters - Veterinary surgeon Healthy Farmers markets - [employer] List Include more beans in your diet for the fiber and protein Yogurt can be frozen to prevent food waste Eggs are an affordable and good source of protein Consider including flaxseed in your diet Continue including fish in your diet as much as you can

## 2016-05-26 ENCOUNTER — Encounter: Payer: Medicare HMO | Attending: Physical Medicine & Rehabilitation | Admitting: Physical Medicine & Rehabilitation

## 2016-05-26 ENCOUNTER — Encounter: Payer: Self-pay | Admitting: Physical Medicine & Rehabilitation

## 2016-05-26 VITALS — BP 117/77 | HR 84

## 2016-05-26 DIAGNOSIS — M533 Sacrococcygeal disorders, not elsewhere classified: Secondary | ICD-10-CM | POA: Insufficient documentation

## 2016-05-26 DIAGNOSIS — M791 Myalgia, unspecified site: Secondary | ICD-10-CM

## 2016-05-26 DIAGNOSIS — G479 Sleep disorder, unspecified: Secondary | ICD-10-CM | POA: Diagnosis not present

## 2016-05-26 DIAGNOSIS — G43909 Migraine, unspecified, not intractable, without status migrainosus: Secondary | ICD-10-CM | POA: Insufficient documentation

## 2016-05-26 DIAGNOSIS — G629 Polyneuropathy, unspecified: Secondary | ICD-10-CM | POA: Diagnosis not present

## 2016-05-26 DIAGNOSIS — M461 Sacroiliitis, not elsewhere classified: Secondary | ICD-10-CM | POA: Diagnosis not present

## 2016-05-26 DIAGNOSIS — M545 Low back pain, unspecified: Secondary | ICD-10-CM

## 2016-05-26 DIAGNOSIS — G894 Chronic pain syndrome: Secondary | ICD-10-CM | POA: Insufficient documentation

## 2016-05-26 DIAGNOSIS — R42 Dizziness and giddiness: Secondary | ICD-10-CM | POA: Insufficient documentation

## 2016-05-26 DIAGNOSIS — G8929 Other chronic pain: Secondary | ICD-10-CM | POA: Diagnosis not present

## 2016-05-26 DIAGNOSIS — M792 Neuralgia and neuritis, unspecified: Secondary | ICD-10-CM

## 2016-05-26 MED ORDER — DULOXETINE HCL 60 MG PO CPEP: 60.0000 mg | ORAL_CAPSULE | Freq: Every day | ORAL | 1 refills | Status: DC

## 2016-05-26 MED ORDER — TIZANIDINE HCL 4 MG PO TABS: 2.0000 mg | ORAL_TABLET | Freq: Two times a day (BID) | ORAL | 0 refills | Status: DC | PRN

## 2016-05-26 MED ORDER — AMITRIPTYLINE HCL 150 MG PO TABS: 75.0000 mg | ORAL_TABLET | Freq: Every day | ORAL | 1 refills | Status: DC

## 2016-05-26 NOTE — Progress Notes (Signed)
Subjective:    Patient ID: Wanda Mcintosh, female    DOB: Jul 09, 1965, 52 y.o.   MRN: [id number]  HPI  51 y/o female with pmh of chronic pain syndrome, b/l SI joint pain, migraine, vertigo presents for follow up of low back pain.   Initially stated it started 12/2008, progressively getting worse after a fall.  Heat improves the pain.  Standing, prolonged walking.  Dull and intense.  Non-radiating.  Constant.  Denies associated weakness and numbness.  Had steroid injections in SI.  Lumbar ESIs and RFAs do not last.  Hydrocodone helps. Mechanical fall in November.  Pain limits cooking and cleaning. Pt current on disability.  Last visit 04/16/16.  She states she missed her last appointment because of a URI.  Since last visit, she continues to use heat.  She has not been able to get to pool therapy.  The Robaxin is not helping.  The Diclofenac works "wonderful".  The Elavil works sometimes and not others.  She states she has a bed time routine.  She states she does not do well with increased activity.  She saw a dietitian.  Pt has not obtained her brace.  Overall, pt states she is doing better, but notes pain during times of increased exertion.   Pain Inventory Average Pain 4 Pain Right Now 4 My pain is dull and aching  In the last 24 hours, has pain interfered with the following? General activity 4 Relation with others 4 Enjoyment of life 4 What TIME of day is your pain at its worst? varies with activity Sleep (in general) Poor  Pain is worse with: walking, bending and standing Pain improves with: heat/ice, medication and injections Relief from Meds: 2  Mobility walk with assistance how many minutes can you walk? 30 ability to climb steps?  yes do you drive?  yes Do you have any goals in this area?  yes  Function disabled: date disabled . I need assistance with the following:  household duties and shopping Do you have any goals in this area?  yes  Neuro/Psych No problems in  this area  Prior Studies Any changes since last visit?  no  Physicians involved in your care Any changes since last visit?  no   Family History  Problem Relation Age of Onset  . Diabetes Maternal Grandmother   . Diabetes Paternal Grandmother    Social History   Social History  . Marital status: Single    Spouse name: N/A  . Number of children: N/A  . Years of education: N/A   Social History Main Topics  . Smoking status: Never Smoker  . Smokeless tobacco: Never Used     Comment: smoked in 7yh grade, occ alcohol  . Alcohol use Yes     Comment: occasional  . Drug use: No  . Sexual activity: Yes    Birth control/ protection: Coitus interruptus   Other Topics Concern  . None   Social History Narrative  . None   Past Surgical History:  Procedure Laterality Date  . BREAST SURGERY     rt fibroid 90. lft 94  . CHOLECYSTECTOMY  07/16/2011   Procedure: LAPAROSCOPIC CHOLECYSTECTOMY WITH INTRAOPERATIVE CHOLANGIOGRAM;  Surgeon: Merrie Roof, MD;  Location: [facility];  Service: General;  Laterality: N/A;  . HAND SURGERY    . HAND TENDON SURGERY     lft carpal tunnel , lig  . LEEP    . TONSILLECTOMY     Past Medical History:  Diagnosis Date  . Back injury   . 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 Chronic back pain   . Complication of anesthesia    possible aggression coming out of anesthesia  . Gallstones   . GERD (gastroesophageal reflux disease)   . Head ache   . Migraine   . Vertigo    BP 117/77   Pulse 84   SpO2 96%   Opioid Risk Score:   Fall Risk Score:  `1  Depression screen PHQ 2/9  Depression screen [facility] 2/9 05/20/2016 04/16/2016  Decreased Interest 0 0  Down, Depressed, Hopeless 0 0  PHQ - 2 Score 0 0  Altered sleeping - 3  Tired, decreased energy - 2  Change in appetite - 0  Feeling bad or failure about yourself  - 1  Trouble concentrating - 0  Moving slowly or fidgety/restless - 0  Suicidal thoughts - 0  PHQ-9 Score - 6  Difficult doing  work/chores - Somewhat difficult    Review of Systems  Constitutional: Negative.   HENT: Negative.   Eyes: Negative.   Respiratory: Negative.   Cardiovascular: Negative.   Gastrointestinal: Negative.   Endocrine: Negative.   Genitourinary: Positive for pelvic pain.  Musculoskeletal: Positive for back pain.  Skin: Negative.   Allergic/Immunologic: Negative.   Neurological: Negative.   Hematological: Negative.   Psychiatric/Behavioral: Negative.       Objective:   Physical Exam Gen: NAD. Vital signs reviewed. Obese HENT: Normocephalic, Atraumatic Eyes: EOMI, No discharge.  Cardio: RRR. No JVD. Pulm: B/l clear to auscultation.  Effort normal Abd: Soft, BS+ MSK:  Gait slightly antalgic, but able to perform heel/toe walking without difficulty  Mild TTP left thoracolumbar PSPs, Bilateral SI joint  No edema.   +FABERs left, neg right  +fortin finger Neuro:   Sensation intact to light touch in all LE dermatomes  Strength  5/5 in all LE myotomes Skin: Warm and Dry. Intact. Psych: Normal mood and behavior.     Assessment & Plan:  51 y/o female with pmh of chronic pain syndrome, b/l SI joint pain, migraine, vertigo presents for follow up low back pain.  1. Chronic low back pain  MRI results reviewed 03/2015, showing lumbar facet arthropathy  Did not respond to ESIs and RFA at [place] does not receive benefit from TENS  Cannot tolerate Mobic, Celebrex, Gabapentin, Baclofen  Robaxin d/ced due to lack of efficacy  Cont heat  Pt went to chiropractor 2 years ago, but states she did not have benefit  Pt had PT in 2012, will consider referral in future  Encouraged pool therapy (again)  Elavil increased to 75 qhs  Cont Diclofenac 75 BID  Trial tylenol  Will order Tizanidine 2mg  BID PRN  Will increase increase Cymbalta to 60mg  daily  Will consider Lyrica, but given hx of edema with other meds, will try to avoid   Will consider topical meds in future   2. Sleep  disturbance  Will increase Elavil to 75mg  qhS  3. Peripheral neuropathy  Currently being workup by PCP  Will consider lidoderm ointment in future  4. Morbid obesity  Pt saw dietitian   Cont weight loss   5. B/l sacroiliitis, L>R  Currently receiving injections at [facility]  Pt to obtain bracing during periods of increased activity  6. Myalgias  Pain improved, will hold off on trigger point injections at this time

## 2016-06-21 ENCOUNTER — Other Ambulatory Visit: Payer: Self-pay | Admitting: Physical Medicine & Rehabilitation

## 2016-06-21 NOTE — Telephone Encounter (Signed)
Pharmacy requesting 90 day supply refills. She has appt with Posey Pronto  On 06/23/16.  Can discuss at appt.

## 2016-06-23 ENCOUNTER — Encounter: Payer: Self-pay | Admitting: Physical Medicine & Rehabilitation

## 2016-06-23 ENCOUNTER — Encounter: Payer: Medicare HMO | Attending: Physical Medicine & Rehabilitation | Admitting: Physical Medicine & Rehabilitation

## 2016-06-23 VITALS — BP 101/76 | HR 105

## 2016-06-23 DIAGNOSIS — M533 Sacrococcygeal disorders, not elsewhere classified: Secondary | ICD-10-CM | POA: Diagnosis not present

## 2016-06-23 DIAGNOSIS — M792 Neuralgia and neuritis, unspecified: Secondary | ICD-10-CM | POA: Diagnosis not present

## 2016-06-23 DIAGNOSIS — R42 Dizziness and giddiness: Secondary | ICD-10-CM | POA: Insufficient documentation

## 2016-06-23 DIAGNOSIS — G8929 Other chronic pain: Secondary | ICD-10-CM | POA: Diagnosis not present

## 2016-06-23 DIAGNOSIS — G479 Sleep disorder, unspecified: Secondary | ICD-10-CM | POA: Diagnosis not present

## 2016-06-23 DIAGNOSIS — M545 Low back pain: Secondary | ICD-10-CM | POA: Diagnosis not present

## 2016-06-23 DIAGNOSIS — M461 Sacroiliitis, not elsewhere classified: Secondary | ICD-10-CM | POA: Diagnosis not present

## 2016-06-23 DIAGNOSIS — M791 Myalgia, unspecified site: Secondary | ICD-10-CM

## 2016-06-23 DIAGNOSIS — G629 Polyneuropathy, unspecified: Secondary | ICD-10-CM | POA: Diagnosis not present

## 2016-06-23 DIAGNOSIS — G43909 Migraine, unspecified, not intractable, without status migrainosus: Secondary | ICD-10-CM | POA: Insufficient documentation

## 2016-06-23 DIAGNOSIS — G894 Chronic pain syndrome: Secondary | ICD-10-CM | POA: Diagnosis present

## 2016-06-23 MED ORDER — DICLOFENAC SODIUM 75 MG PO TBEC: 75.0000 mg | DELAYED_RELEASE_TABLET | Freq: Two times a day (BID) | ORAL | 1 refills | Status: DC

## 2016-06-23 NOTE — Progress Notes (Signed)
Subjective:    Patient ID: Wanda Mcintosh, female    DOB: May 01, 1965, 51 y.o.   MRN: [id number]  HPI  51 y/o female with pmh of chronic pain syndrome, b/l SI joint pain, migraine, vertigo presents for follow up of low back pain.   Initially stated it started 12/2008, progressively getting worse after a fall.  Heat improves the pain.  Standing, prolonged walking.  Dull and intense.  Non-radiating.  Constant.  Denies associated weakness and numbness.  Had steroid injections in SI.  Lumbar ESIs and RFAs do not last.  Hydrocodone helps. Mechanical fall in November.  Pain limits cooking and cleaning. Pt current on disability.  Last visit 05/26/16.  Since last visit, she is going to pool therapy, which is helping.  The increase in Elavil appears to have helped, which has improved her sleep.  Continues to take Diclofenac BID.  Tylenol appears to be helping as well.  Tizanidine causes dry mouth, but is helping.  She did well with increase in Cymbalta. She has been gaining weight.  She gets her SI joint belt on Friday.    Pain Inventory Average Pain 2 Pain Right Now 2 My pain is burning and aching  In the last 24 hours, has pain interfered with the following? General activity 7 Relation with others 0 Enjoyment of life 5 What TIME of day is your pain at its worst? daytime Sleep (in general) Fair  Pain is worse with: walking, standing and some activites Pain improves with: rest, heat/ice and medication Relief from Meds: 8  Mobility walk without assistance how many minutes can you walk? 30 ability to climb steps?  yes do you drive?  yes Do you have any goals in this area?  yes  Function disabled: date disabled . I need assistance with the following:  household duties and shopping  Neuro/Psych No problems in this area  Prior Studies Any changes since last visit?  no  Physicians involved in your care Any changes since last visit?  no   Family History  Problem Relation Age of  Onset  . Diabetes Maternal Grandmother   . Diabetes Paternal Grandmother    Social History   Social History  . Marital status: Single    Spouse name: N/A  . Number of children: N/A  . Years of education: N/A   Social History Main Topics  . Smoking status: Never Smoker  . Smokeless tobacco: Never Used     Comment: smoked in 7yh grade, occ alcohol  . Alcohol use Yes     Comment: occasional  . Drug use: No  . Sexual activity: Yes    Birth control/ protection: Coitus interruptus   Other Topics Concern  . None   Social History Narrative  . None   Past Surgical History:  Procedure Laterality Date  . BREAST SURGERY     rt fibroid 90. lft 94  . CHOLECYSTECTOMY  07/16/2011   Procedure: LAPAROSCOPIC CHOLECYSTECTOMY WITH INTRAOPERATIVE CHOLANGIOGRAM;  Surgeon: Merrie Roof, MD;  Location: [facility];  Service: General;  Laterality: N/A;  . HAND SURGERY    . HAND TENDON SURGERY     lft carpal tunnel , lig  . LEEP    . TONSILLECTOMY     Past Medical History:  Diagnosis Date  . Back injury   . 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 Chronic back pain   . Complication of anesthesia    possible aggression coming out of anesthesia  .  Gallstones   . GERD (gastroesophageal reflux disease)   . Head ache   . Migraine   . Vertigo    BP 101/76   Pulse (!) 105   SpO2 96%   Opioid Risk Score:   Fall Risk Score:  `1  Depression screen PHQ 2/9  Depression screen [facility] 2/9 06/23/2016 05/20/2016 04/16/2016  Decreased Interest 0 0 0  Down, Depressed, Hopeless 0 0 0  PHQ - 2 Score 0 0 0  Altered sleeping - - 3  Tired, decreased energy - - 2  Change in appetite - - 0  Feeling bad or failure about yourself  - - 1  Trouble concentrating - - 0  Moving slowly or fidgety/restless - - 0  Suicidal thoughts - - 0  PHQ-9 Score - - 6  Difficult doing work/chores - - Somewhat difficult    Review of Systems  Constitutional: Negative.   HENT: Negative.   Eyes: Negative.   Respiratory: Negative.    Cardiovascular: Negative.   Gastrointestinal: Negative.   Endocrine: Negative.   Genitourinary: Positive for pelvic pain.  Musculoskeletal: Positive for back pain.  Skin: Negative.   Allergic/Immunologic: Negative.   Neurological: Negative.   Hematological: Negative.   Psychiatric/Behavioral: Negative.   All other systems reviewed and are negative.     Objective:   Physical Exam Gen: NAD. Vital signs reviewed. Obese HENT: Normocephalic, Atraumatic Eyes: EOMI, No discharge.  Cardio: RRR. No JVD. Pulm: B/l clear to auscultation.  Effort normal Abd: Soft, BS+ MSK:  Gait slightly antalgic, but able to perform heel/toe walking without difficulty  Very mild TTP left thoracolumbar PSPs, Bilateral SI joint  No edema.  Neuro:   Strength  5/5 in all LE myotomes Skin: Warm and Dry. Intact. Psych: Normal mood and behavior.     Assessment & Plan:  51 y/o female with pmh of chronic pain syndrome, b/l SI joint pain, migraine, vertigo presents for follow up low back pain.  1. Chronic low back pain  MRI results reviewed 03/2015, showing lumbar facet arthropathy  Did not respond to ESIs and RFA at [place] does not receive benefit from TENS  Cannot tolerate Mobic, Celebrex, Gabapentin, Baclofen  Robaxin d/ced due to lack of efficacy  Pt went to chiropractor 2 years ago, but states she did not have benefit  Cont heat  Cont pool therapy  Cont Elavil 75 qhs  Cont Diclofenac 75 BID  Trial tylenol  Cont Tizanidine 2mg  BID PRN  Cont Cymbalta to 60mg  daily   Encouraged aerobic excercise  Pt had PT in 2012, will consider referral in future   Will consider Lyrica, but given hx of edema with other meds, will try to avoid   Will consider topical meds in future   2. Sleep disturbance  Cont Elavil to 75mg  qhS  3. Peripheral neuropathy  Currently being workup by PCP  Will consider lidoderm ointment in future, however, improved  4. Morbid obesity  Pt saw dietitian   Cont weight loss,  states she needs to gain weight before she can lose it   5. B/l sacroiliitis, L>R  Currently receiving injections at [facility]  Pt to obtain bracing during periods of increased activity, to obtain Friday  6. Myalgias  Pain improved, will hold off on trigger point injections at this time

## 2016-07-17 ENCOUNTER — Other Ambulatory Visit: Payer: Self-pay | Admitting: Physical Medicine & Rehabilitation

## 2016-07-31 ENCOUNTER — Encounter: Payer: Self-pay | Admitting: Physical Medicine & Rehabilitation

## 2016-08-03 NOTE — Telephone Encounter (Signed)
Per Dr Posey Pronto: Please schedule with Dr. Letta Pate for SI joint injection. Thank you.

## 2016-08-13 ENCOUNTER — Other Ambulatory Visit: Payer: Self-pay | Admitting: Physical Medicine & Rehabilitation

## 2016-08-18 ENCOUNTER — Encounter: Payer: Self-pay | Admitting: Gastroenterology

## 2016-08-25 ENCOUNTER — Other Ambulatory Visit: Payer: Self-pay | Admitting: *Deleted

## 2016-08-25 MED ORDER — TIZANIDINE HCL 4 MG PO TABS: 2.0000 mg | ORAL_TABLET | Freq: Two times a day (BID) | ORAL | 0 refills | Status: DC | PRN

## 2016-08-25 MED ORDER — DICLOFENAC SODIUM 75 MG PO TBEC: DELAYED_RELEASE_TABLET | ORAL | 1 refills | Status: DC

## 2016-09-16 ENCOUNTER — Encounter: Payer: Self-pay | Admitting: Gastroenterology

## 2016-09-16 ENCOUNTER — Ambulatory Visit (INDEPENDENT_AMBULATORY_CARE_PROVIDER_SITE_OTHER): Payer: Medicare HMO | Admitting: Gastroenterology

## 2016-09-16 VITALS — BP 126/80 | HR 88 | Ht 67.13 in | Wt 265.0 lb

## 2016-09-16 DIAGNOSIS — K648 Other hemorrhoids: Secondary | ICD-10-CM | POA: Diagnosis not present

## 2016-09-16 DIAGNOSIS — K219 Gastro-esophageal reflux disease without esophagitis: Secondary | ICD-10-CM | POA: Diagnosis not present

## 2016-09-16 DIAGNOSIS — K602 Anal fissure, unspecified: Secondary | ICD-10-CM | POA: Diagnosis not present

## 2016-09-16 DIAGNOSIS — Z8719 Personal history of other diseases of the digestive system: Secondary | ICD-10-CM

## 2016-09-16 MED ORDER — AMBULATORY NON FORMULARY MEDICATION: 1 refills | Status: DC

## 2016-09-16 NOTE — Progress Notes (Signed)
HPI :  51 y/o female with a reported history of Barrett's, GERD, chronic back pain, here for a new patient visit for Barrett's esophagus.   Reportedly had Barrett's diagnosed in 2009, no record of that EGD available. Last EGD in 2015 which did not show any evidence of Barrett's.   She reports a history of heartburn usually after drinking liquids historically, thought it was very mild. She had dysphagia in 2009 which led to an EGD and she was told she had Barrett's. She reports taking the omeprazole every day and she does not have any difficulty with her swallowing. She is taking 20mg  once daily. She denies any dysphagia on this regimen, but endorses rare heartburn breakthrough. Usually the prilosec works well for her.   No FH of esophageal cancer. No FH of colon cancer. No tobacco use.  She has chronic pain from joint pain / back pains on regimen below.   She was told she has hemorrhoids which causes bleeding from her rectum. She has rare perianal pain / burning sensation after bleeding. She has 1-2 BMs per day. Stools can vary from constipated to diarrhea. She has had her GB removed and had some diarrhea after that, was given cholestyramine but didn't take it. She had a colonoscopy in 2015 and thinks she had another one in 2017 but we don't have the report of the latter exam. She reports symptoms ongoing for a while.   She is concernd about risks / benefits of PPIs use which we discussed at length  Endoscopic history: EGD 02/07/2014 - normal exam, biopsies taken of the GEJ which showed no evidence of Barrett's esophagus Colonoscopy 02/07/2014 - diverticulosis, otherwise normal colon. Biopsies show no evidence of microscopic colitis.    Past Medical History:  Diagnosis Date  . Anemia   . Back injury   . Barrett's esophagus   . 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 Chronic back pain   . Complication of anesthesia    possible aggression coming out of anesthesia  . Gallstones   . GERD  (gastroesophageal reflux disease)   . Head ache   . Migraine   . Osteoarthritis   . Status post dilation of esophageal narrowing   . Vertigo      Past Surgical History:  Procedure Laterality Date  . BREAST SURGERY Bilateral    rt fibroid 90. lft 94  . CARPAL TUNNEL RELEASE Left   . CHOLECYSTECTOMY  07/16/2011   Procedure: LAPAROSCOPIC CHOLECYSTECTOMY WITH INTRAOPERATIVE CHOLANGIOGRAM;  Surgeon: Merrie Roof, MD;  Location: [facility];  Service: General;  Laterality: N/A;  . HAND SURGERY     cartilage  . LEEP    . TONSILLECTOMY     Family History  Problem Relation Age of Onset  . Hypertension Mother   . Colon polyps Father   . Hypertension Father   . Diabetes Maternal Grandmother   . Diabetes Paternal Grandmother   . Prostate cancer Paternal Grandfather   . Diabetes Maternal Aunt   . Irritable bowel syndrome Son   . Colon polyps Paternal Uncle   . Prostate cancer Paternal Uncle   . Colon polyps Paternal Uncle    Social History  Substance Use Topics  . Smoking status: Never Smoker  . Smokeless tobacco: Never Used     Comment: smoked in 7yh grade, occ alcohol  . Alcohol use Yes     Comment: occasional   Current Outpatient Prescriptions  Medication Sig Dispense Refill  . amitriptyline (  ELAVIL) 150 MG tablet TAKE 1/2 TABLET(75 MG) BY MOUTH AT BEDTIME 15 tablet 1  . aspirin-acetaminophen-caffeine (EXCEDRIN MIGRAINE) 782-956-21 MG tablet Take by mouth every 6 (six) hours as needed for headache.    . b complex vitamins tablet Take 1 tablet by mouth daily.    . diclofenac (VOLTAREN) 75 MG EC tablet TAKE 1 TABLET(75 MG) BY MOUTH TWICE DAILY 60 tablet 1  . DULoxetine (CYMBALTA) 60 MG capsule TAKE 1 CAPSULE(60 MG) BY MOUTH DAILY 30 capsule 2  . magnesium oxide (MAG-OX) 400 MG tablet Take 400 mg by mouth daily.    Marland Kitchen MAGNESIUM OXIDE -MG SUPPLEMENT PO Take 400 mg by mouth daily.    Marland Kitchen omeprazole (PRILOSEC) 20 MG capsule Take 20 mg by mouth daily.    . SUMAtriptan (IMITREX) 100 MG  tablet Take 100 mg by mouth daily as needed for migraine. May repeat in 2 hours if headache persists or recurs.    Marland Kitchen tiZANidine (ZANAFLEX) 4 MG tablet Take 0.5 tablets (2 mg total) by mouth 2 (two) times daily as needed for muscle spasms. 30 tablet 0   No current facility-administered medications for this visit.    Allergies  Allergen Reactions  . Baclofen Other (See Comments) and Swelling    Lower limb swelling  . Celebrex [Celecoxib] Other (See Comments)    Excessive bloating GI pain Paleness Excessive sleeping Mood change (aggression)  . Flexeril [Cyclobenzaprine] Other (See Comments)    Other reaction(s): Headache Headache  Headache   . Gabapentin Swelling    Tears up stomach Feet swell  . Nsaids     Mobic tears up stomach.  . Ondansetron Hcl Other (See Comments)    Other reaction(s): Headache migraines  . Zofran Other (See Comments)    migraines     Review of Systems: All systems reviewed and negative except where noted in HPI.   Lab Results  Component Value Date   WBC 5.8 10/20/2012   HGB 15.2 (H) 10/20/2012   HCT 42.2 10/20/2012   MCV 84.1 10/20/2012   PLT 229 10/20/2012    Lab Results  Component Value Date   CREATININE 0.79 10/20/2012   BUN 15 10/20/2012   NA 138 10/20/2012   K 3.8 10/20/2012   CL 104 10/20/2012   CO2 27 10/20/2012    Lab Results  Component Value Date   ALT 15 10/20/2012   AST 14 10/20/2012   ALKPHOS 48 10/20/2012   BILITOT 0.3 10/20/2012     Physical Exam: BP 126/80 (BP Location: Left Arm, Patient Position: Sitting, Cuff Size: Normal)   Pulse 88   Ht 5' 7.13" (1.705 m) Comment: height measured without shoes  Wt 265 lb (120.2 kg)   LMP 05/28/2016   BMI 41.35 kg/m  Constitutional: Pleasant,well-developed, female in no acute distress. HEENT: Normocephalic and atraumatic. Conjunctivae are normal. No scleral icterus. Neck supple.  Cardiovascular: Normal rate, regular rhythm.  Pulmonary/chest: Effort normal and breath  sounds normal. No wheezing, rales or rhonchi. Abdominal: Soft, nondistended, nontender. There are no masses palpable. No hepatomegaly. DRE / Anoscopy - small anal fissure appreciated, no mass lesions, hemorrhoids on anoscopy Extremities: no edema Lymphadenopathy: No cervical adenopathy noted. Neurological: Alert and oriented to person place and time. Skin: Skin is warm and dry. No rashes noted. Psychiatric: Normal mood and affect. Behavior is normal.   ASSESSMENT AND PLAN: 52 year old female here to establish care for the following issues:  GERD / remote history of Barrett's esophagus - her symptoms are well controlled on  low-dose omeprazole 20 mg once daily without any significant breakthrough. I reviewed what Barrett's esophagus is with her and potential long-term risks. While she reports a diagnosis of Barrett's in 2009 this was not appreciated in 2015, no evidence of Barrett's on biopsies. Despite this she remains concerned that she still has Barrett's, and inquired about a repeat endoscopy. I told her that even if she did have Barrett's that surveillance is recommended every 3-5 years per national guidelines. If she does have Barrett's it is recommended she continue a PPI indefinitely. If she does not have Barrett's esophagus then the management of heartburn would be different and we would consider transitioning her to Zantac. I discussed risk and benefits of long-term PPI use and agree that if we can manage her symptoms with Zantac that would be best. She is not comfortable de-escalating her therapy to Zantac without knowing for sure if she that she does not have Barrett's. Based on her last endoscopy this would appear to be the case, however she would feel more comfortable having another endoscopy prior to making this decision. Following our discussion I offered her another endoscopy for this purpose, however we'll perform this later this fall about 3 years after her last exam. She wishes to  continue her omeprazole until that is done.  Anal fissure / internal hemorrhoids - small anal fissure noted on perianal exam which I suspect is the likely cause of her symptoms. She also does have internal hemorrhoids on anoscopy but they do not appear inflamed. Colonoscopy is up-to-date without polyps. Recommend topical nitroglycerin 0.125% applied 3 times a day. Also recommend daily fiber supplement to help regularlize her bowels. If symptoms persist despite therapy then she can call me for reassessment.  Awendaw Cellar, MD [facility] Pager [phone]  CC: Theressa Millard, MD

## 2016-09-16 NOTE — Patient Instructions (Signed)
If you are age 50 or older, your body mass index should be between 23-30. Your Body mass index is 41.35 kg/m. If this is out of the aforementioned range listed, please consider follow up with your Primary Care Provider.  If you are age 74 or younger, your body mass index should be between 19-25. Your Body mass index is 41.35 kg/m. If this is out of the aformentioned range listed, please consider follow up with your Primary Care Provider.   We have sent the following medications to your pharmacy for you to pick up at your convenience:  Nitroglycerine Ointment- pick up at [facility]  We have sent a prescription for nitroglycerin 0.125% gel to [facility]. You should apply a pea size amount to your rectum three times daily.  [facility]'s information is below: Address: [address], [place], [place] [address]  [phone]  You have been given a handout on a daily fiber supplement.  You will be contacted by letter when it is time to schedule your endoscopy that is due this November.  Thank you.

## 2016-09-23 ENCOUNTER — Encounter: Payer: Medicare HMO | Attending: Physical Medicine & Rehabilitation | Admitting: Physical Medicine & Rehabilitation

## 2016-09-23 ENCOUNTER — Encounter: Payer: Self-pay | Admitting: Physical Medicine & Rehabilitation

## 2016-09-23 VITALS — BP 113/81 | HR 90 | Resp 14

## 2016-09-23 DIAGNOSIS — R42 Dizziness and giddiness: Secondary | ICD-10-CM | POA: Diagnosis not present

## 2016-09-23 DIAGNOSIS — G479 Sleep disorder, unspecified: Secondary | ICD-10-CM | POA: Diagnosis not present

## 2016-09-23 DIAGNOSIS — G629 Polyneuropathy, unspecified: Secondary | ICD-10-CM | POA: Insufficient documentation

## 2016-09-23 DIAGNOSIS — M545 Low back pain: Secondary | ICD-10-CM | POA: Diagnosis not present

## 2016-09-23 DIAGNOSIS — M533 Sacrococcygeal disorders, not elsewhere classified: Secondary | ICD-10-CM | POA: Insufficient documentation

## 2016-09-23 DIAGNOSIS — G894 Chronic pain syndrome: Secondary | ICD-10-CM

## 2016-09-23 DIAGNOSIS — M461 Sacroiliitis, not elsewhere classified: Secondary | ICD-10-CM | POA: Diagnosis not present

## 2016-09-23 DIAGNOSIS — M791 Myalgia, unspecified site: Secondary | ICD-10-CM

## 2016-09-23 DIAGNOSIS — G8929 Other chronic pain: Secondary | ICD-10-CM

## 2016-09-23 DIAGNOSIS — M792 Neuralgia and neuritis, unspecified: Secondary | ICD-10-CM

## 2016-09-23 DIAGNOSIS — G43909 Migraine, unspecified, not intractable, without status migrainosus: Secondary | ICD-10-CM | POA: Insufficient documentation

## 2016-09-23 MED ORDER — AMITRIPTYLINE HCL 50 MG PO TABS: 50.0000 mg | ORAL_TABLET | Freq: Every day | ORAL | 1 refills | Status: DC

## 2016-09-23 NOTE — Progress Notes (Signed)
Subjective:    Patient ID: Wanda Mcintosh, female    DOB: 04-22-1965, 51 y.o.   MRN: [id number]  HPI  51 y/o female with pmh of chronic pain syndrome, b/l SI joint pain, migraine, vertigo presents for follow up of low back pain.   Initially stated it started 12/2008, progressively getting worse after a fall.  Heat improves the pain.  Standing, prolonged walking.  Dull and intense.  Non-radiating.  Constant.  Denies associated weakness and numbness.  Had steroid injections in SI.  Lumbar ESIs and RFAs do not last.  Hydrocodone helps. Mechanical fall in November.  Pain limits cooking and cleaning. Pt current on disability.  Last visit 06/23/16. Since that time, she requested SI joint injections from Dr. Letta Pate.  She states she was never scheduled and now has a schedule to see [employer].  She continues to use heat/ice, pool therapy, Tizanidine, Cymbalta, Diclofenac.  She states the Elavil was too much and she starting cutting her meds in 1/2.  Neuropathy is controlled. She has gained 10 pounds since her last visit.  Overall, SIs are her main problem at present. Pt states brace is helping her.    Pain Inventory Average Pain 5 Pain Right Now 4 My pain is dull and aching  In the last 24 hours, has pain interfered with the following? General activity 6 Relation with others 5 Enjoyment of life 6 What TIME of day is your pain at its worst? daytime, night  Sleep (in general) Fair  Pain is worse with: walking, sitting, inactivity, standing and some activites Pain improves with: rest and injections Relief from Meds: 1  Mobility walk without assistance how many minutes can you walk? 10 ability to climb steps?  yes do you drive?  yes transfers alone Do you have any goals in this area?  yes  Function disabled: date disabled . I need assistance with the following:  household duties and shopping  Neuro/Psych No problems in this area  Prior Studies Any changes since last  visit?  no  Physicians involved in your care Any changes since last visit?  no   Family History  Problem Relation Age of Onset  . Hypertension Mother   . Colon polyps Father   . Hypertension Father   . Diabetes Maternal Grandmother   . Diabetes Paternal Grandmother   . Prostate cancer Paternal Grandfather   . Diabetes Maternal Aunt   . Irritable bowel syndrome Son   . Colon polyps Paternal Uncle   . Prostate cancer Paternal Uncle   . Colon polyps Paternal Uncle    Social History   Social History  . Marital status: Single    Spouse name: N/A  . Number of children: 3  . Years of education: N/A   Occupational History  . disabled/Nurse    Social History Main Topics  . Smoking status: Never Smoker  . Smokeless tobacco: Never Used     Comment: smoked in 7yh grade, occ alcohol  . Alcohol use Yes     Comment: occasional  . Drug use: No  . Sexual activity: Yes    Birth control/ protection: Coitus interruptus   Other Topics Concern  . None   Social History Narrative  . None   Past Surgical History:  Procedure Laterality Date  . BREAST SURGERY Bilateral    rt fibroid 90. lft 94  . CARPAL TUNNEL RELEASE Left   . CHOLECYSTECTOMY  07/16/2011   Procedure: LAPAROSCOPIC CHOLECYSTECTOMY WITH INTRAOPERATIVE CHOLANGIOGRAM;  Surgeon:  Merrie Roof, MD;  Location: [place];  Service: General;  Laterality: N/A;  . HAND SURGERY     cartilage  . LEEP    . TONSILLECTOMY     Past Medical History:  Diagnosis Date  . Anemia   . Back injury   . Barrett's esophagus   . 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 Chronic back pain   . Complication of anesthesia    possible aggression coming out of anesthesia  . Gallstones   . GERD (gastroesophageal reflux disease)   . Head ache   . Migraine   . Osteoarthritis   . Status post dilation of esophageal narrowing   . Vertigo    BP 113/81 (BP Location: Left Arm, Patient Position: Sitting, Cuff Size: Normal)   Pulse 90   Resp 14   SpO2  94%   Opioid Risk Score:   Fall Risk Score:  `1  Depression screen PHQ 2/9  Depression screen [facility] 2/9 06/23/2016 05/20/2016 04/16/2016  Decreased Interest 0 0 0  Down, Depressed, Hopeless 0 0 0  PHQ - 2 Score 0 0 0  Altered sleeping - - 3  Tired, decreased energy - - 2  Change in appetite - - 0  Feeling bad or failure about yourself  - - 1  Trouble concentrating - - 0  Moving slowly or fidgety/restless - - 0  Suicidal thoughts - - 0  PHQ-9 Score - - 6  Difficult doing work/chores - - Somewhat difficult    Review of Systems  Constitutional: Positive for unexpected weight change.  HENT: Negative.   Eyes: Negative.   Respiratory: Negative.   Cardiovascular: Negative.   Gastrointestinal: Negative.   Endocrine: Negative.   Genitourinary: Positive for pelvic pain.  Musculoskeletal: Positive for back pain.  Skin: Negative.   Allergic/Immunologic: Negative.   Neurological: Negative.   Hematological: Negative.   Psychiatric/Behavioral: Negative.   All other systems reviewed and are negative.     Objective:   Physical Exam Gen: NAD. Vital signs reviewed. Obese HENT: Normocephalic, Atraumatic Eyes: EOMI, No discharge.  Cardio: RRR. No JVD. Pulm: B/l clear to auscultation.  Effort normal  Abd: Soft, BS+ MSK:  Gait slightly antalgic, but able to perform heel/toe walking without difficulty  Mild TTP right iliac crest >right SI  No edema.  Neuro:   Strength  5/5 in all LE myotomes Skin: Warm and Dry. Intact. Psych: Normal mood and behavior.     Assessment & Plan:  51 y/o female with pmh of chronic pain syndrome, b/l SI joint pain, migraine, vertigo presents for follow up low back pain.  1. Chronic low back pain  MRI results reviewed 03/2015, showing lumbar facet arthropathy  Did not respond to ESIs and RFA at [place] does not receive benefit from TENS, tyelnol  Cannot tolerate Mobic, Celebrex, Gabapentin, Baclofen  Robaxin d/ced due to lack of efficacy  Pt went to  chiropractor 2 years ago, but states she did not have benefit  Cont heat  Cont pool therapy  Cont Diclofenac 75 BID  Cont Tizanidine 2mg  BID PRN  Cont Cymbalta to 60mg  daily   Encouraged aerobic exercise, states she does 2/week for 30 minutes  Pt had PT in 2012, will consider referral in future   Will consider Lyrica, but given hx of edema with other meds, will try to avoid   Will consider topical meds in future   2. Sleep disturbance  Elavil changed to 50mg  qhS, she  could not tolerate 75mg   3. Peripheral neuropathy  Currently being workup by PCP  Will consider lidoderm ointment in future  4. Morbid obesity  Pt saw dietitian   Gaining weight, unsure why, encouraged weight loss  5. B/l sacroiliitis, L>R  Awaiting injection for SI at [facility] bracing, which is helping. Encouraged limited use  6. Myalgias  Will hold off on trigger point injections at this time

## 2016-10-11 ENCOUNTER — Other Ambulatory Visit: Payer: Self-pay | Admitting: Family Medicine

## 2016-10-11 DIAGNOSIS — N63 Unspecified lump in unspecified breast: Secondary | ICD-10-CM

## 2016-10-13 ENCOUNTER — Other Ambulatory Visit: Payer: Self-pay | Admitting: Physical Medicine & Rehabilitation

## 2016-11-18 ENCOUNTER — Other Ambulatory Visit: Payer: Self-pay | Admitting: Physical Medicine & Rehabilitation

## 2016-11-19 ENCOUNTER — Other Ambulatory Visit: Payer: Self-pay | Admitting: *Deleted

## 2016-11-19 MED ORDER — DULOXETINE HCL 60 MG PO CPEP: ORAL_CAPSULE | ORAL | 0 refills | Status: DC

## 2016-11-24 ENCOUNTER — Encounter: Payer: Self-pay | Admitting: Physical Medicine & Rehabilitation

## 2016-11-24 ENCOUNTER — Encounter: Payer: Medicare HMO | Attending: Physical Medicine & Rehabilitation | Admitting: Physical Medicine & Rehabilitation

## 2016-11-24 VITALS — BP 123/84 | HR 74

## 2016-11-24 DIAGNOSIS — G8929 Other chronic pain: Secondary | ICD-10-CM

## 2016-11-24 DIAGNOSIS — G629 Polyneuropathy, unspecified: Secondary | ICD-10-CM | POA: Diagnosis not present

## 2016-11-24 DIAGNOSIS — M791 Myalgia, unspecified site: Secondary | ICD-10-CM

## 2016-11-24 DIAGNOSIS — G2581 Restless legs syndrome: Secondary | ICD-10-CM

## 2016-11-24 DIAGNOSIS — M533 Sacrococcygeal disorders, not elsewhere classified: Secondary | ICD-10-CM | POA: Insufficient documentation

## 2016-11-24 DIAGNOSIS — M545 Low back pain, unspecified: Secondary | ICD-10-CM

## 2016-11-24 DIAGNOSIS — G43909 Migraine, unspecified, not intractable, without status migrainosus: Secondary | ICD-10-CM | POA: Diagnosis not present

## 2016-11-24 DIAGNOSIS — G894 Chronic pain syndrome: Secondary | ICD-10-CM | POA: Diagnosis not present

## 2016-11-24 DIAGNOSIS — G479 Sleep disorder, unspecified: Secondary | ICD-10-CM

## 2016-11-24 DIAGNOSIS — M461 Sacroiliitis, not elsewhere classified: Secondary | ICD-10-CM

## 2016-11-24 DIAGNOSIS — R42 Dizziness and giddiness: Secondary | ICD-10-CM | POA: Insufficient documentation

## 2016-11-24 DIAGNOSIS — M792 Neuralgia and neuritis, unspecified: Secondary | ICD-10-CM

## 2016-11-24 MED ORDER — DULOXETINE HCL 60 MG PO CPEP: ORAL_CAPSULE | ORAL | 0 refills | Status: DC

## 2016-11-24 MED ORDER — ROPINIROLE HCL 0.25 MG PO TABS: 0.2500 mg | ORAL_TABLET | Freq: Every day | ORAL | 1 refills | Status: DC

## 2016-11-24 MED ORDER — LIDOCAINE 5 % EX OINT: 1.0000 "application " | TOPICAL_OINTMENT | CUTANEOUS | 0 refills | Status: DC | PRN

## 2016-11-24 NOTE — Progress Notes (Signed)
Subjective:    Patient ID: Wanda Mcintosh, female    DOB: March 17, 1966, 51 y.o.   MRN: [id number]  HPI  51 y/o female with pmh of chronic pain syndrome, b/l SI joint pain, migraine, vertigo presents for follow up of low back pain.   Initially stated it started 12/2008, progressively getting worse after a fall.  Heat improves the pain.  Standing, prolonged walking.  Dull and intense.  Non-radiating.  Constant.  Denies associated weakness and numbness.  Had steroid injections in SI.  Lumbar ESIs and RFAs do not last.  Hydrocodone helps. Mechanical fall in November.  Pain limits cooking and cleaning. Pt current on disability.  Last visit 09/23/16. Since last visit, she stopped going to pool therapy, due to financial constraints.  She continues to take Dicofenac and Cymbalta.  She is taking Tizanidine ~4/month.  She is now ambulating up to 1.89miles/day.  She stopped taking the Elavil due to side effects. She gained weight.    Pain Inventory Average Pain 2 Pain Right Now 2 My pain is dull and aching  In the last 24 hours, has pain interfered with the following? General activity 6 Relation with others 5 Enjoyment of life 6 What TIME of day is your pain at its worst? daytime, night  Sleep (in general) Fair  Pain is worse with: walking, sitting, inactivity, standing and some activites Pain improves with: rest and injections Relief from Meds: 1  Mobility walk without assistance how many minutes can you walk? 10 ability to climb steps?  yes do you drive?  yes transfers alone Do you have any goals in this area?  yes  Function disabled: date disabled . I need assistance with the following:  household duties and shopping  Neuro/Psych No problems in this area  Prior Studies Any changes since last visit?  no  Physicians involved in your care Any changes since last visit?  no   Family History  Problem Relation Age of Onset  . Hypertension Mother   . Colon polyps Father   .  Hypertension Father   . Diabetes Maternal Grandmother   . Diabetes Paternal Grandmother   . Prostate cancer Paternal Grandfather   . Diabetes Maternal Aunt   . Irritable bowel syndrome Son   . Colon polyps Paternal Uncle   . Prostate cancer Paternal Uncle   . Colon polyps Paternal Uncle    Social History   Social History  . Marital status: Single    Spouse name: N/A  . Number of children: 3  . Years of education: N/A   Occupational History  . disabled/Nurse    Social History Main Topics  . Smoking status: Never Smoker  . Smokeless tobacco: Never Used     Comment: smoked in 7yh grade, occ alcohol  . Alcohol use Yes     Comment: occasional  . Drug use: No  . Sexual activity: Yes    Birth control/ protection: Coitus interruptus   Other Topics Concern  . None   Social History Narrative  . None   Past Surgical History:  Procedure Laterality Date  . BREAST SURGERY Bilateral    rt fibroid 90. lft 94  . CARPAL TUNNEL RELEASE Left   . CHOLECYSTECTOMY  07/16/2011   Procedure: LAPAROSCOPIC CHOLECYSTECTOMY WITH INTRAOPERATIVE CHOLANGIOGRAM;  Surgeon: Merrie Roof, MD;  Location: [place];  Service: General;  Laterality: N/A;  . HAND SURGERY     cartilage  . LEEP    . TONSILLECTOMY  Past Medical History:  Diagnosis Date  . Anemia   . Back injury   . Barrett's esophagus   . 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 Chronic back pain   . Complication of anesthesia    possible aggression coming out of anesthesia  . Gallstones   . GERD (gastroesophageal reflux disease)   . Head ache   . Migraine   . Osteoarthritis   . Status post dilation of esophageal narrowing   . Vertigo    BP 123/84   Pulse 74   SpO2 97%   Opioid Risk Score:   Fall Risk Score:  `1  Depression screen PHQ 2/9  Depression screen [facility] 2/9 06/23/2016 05/20/2016 04/16/2016  Decreased Interest 0 0 0  Down, Depressed, Hopeless 0 0 0  PHQ - 2 Score 0 0 0  Altered sleeping - - 3  Tired, decreased energy  - - 2  Change in appetite - - 0  Feeling bad or failure about yourself  - - 1  Trouble concentrating - - 0  Moving slowly or fidgety/restless - - 0  Suicidal thoughts - - 0  PHQ-9 Score - - 6  Difficult doing work/chores - - Somewhat difficult    Review of Systems  Constitutional: Positive for unexpected weight change.  HENT: Negative.   Eyes: Negative.   Respiratory: Negative.   Cardiovascular: Negative.   Gastrointestinal: Negative.   Endocrine: Negative.   Genitourinary: Negative.   Musculoskeletal: Positive for back pain.  Skin: Negative.   Allergic/Immunologic: Negative.   Neurological: Negative.   Hematological: Negative.   Psychiatric/Behavioral: Negative.   All other systems reviewed and are negative.     Objective:   Physical Exam Gen: NAD. Vital signs reviewed. Obese HENT: Normocephalic, Atraumatic Eyes: EOMI, No discharge.  Cardio: RRR. No JVD. Pulm: B/l clear to auscultation.  Effort normal Abd: Soft, BS+ MSK:  Gait slightly antalgic  Mild TTP right b/l SI  No edema.  Neuro:   Strength  5/5 in all LE myotomes Skin: Warm and Dry. Intact. Psych: Normal mood and behavior.     Assessment & Plan:  51 y/o female with pmh of chronic pain syndrome, b/l SI joint pain, migraine, vertigo presents for follow up low back pain.  1. Chronic low back pain with sciatica  MRI results reviewed 03/2015, showing lumbar facet arthropathy  Did not respond to ESIs and RFA at [place] does not receive benefit from TENS, tyelnol  Cannot tolerate Mobic, Celebrex, Gabapentin, Baclofen  Robaxin d/ced due to lack of efficacy  Pt went to chiropractor 2 years ago, but states she did not have benefit  Cont heat  Cont pool therapy when finances allow  Cont Diclofenac 75 BID  Cont Tizanidine 2mg  BID PRN  Cont Cymbalta to 60mg  daily   Cont aerobic exercise, states she does 2/week for 30 minutes  Pt had PT in 2012, will consider referral in future   Will consider Lyrica, but given  hx of edema with other meds, will try to avoid   Will consider topical meds in future   2. Sleep disturbance  Pt stopped elavil  See #7  3. Peripheral neuropathy  Currently being workup by PCP  Will order lidoderm ointment  4. Morbid obesity  Pt saw dietitian   Cont to encourage weight loss  5. B/l sacroiliitis, L>R  Good benefit from SI at [facility] bracing, which is helping. Encouraged limited use  6. Myalgias  Will  hold off on trigger point injections at this time  7. RLS  Will order requip 0.25mg  qhs, may increase to 0.5 after 1 week

## 2016-12-01 ENCOUNTER — Other Ambulatory Visit: Payer: Self-pay

## 2016-12-01 ENCOUNTER — Other Ambulatory Visit: Payer: Self-pay | Admitting: *Deleted

## 2016-12-01 ENCOUNTER — Encounter: Payer: Self-pay | Admitting: Physical Medicine & Rehabilitation

## 2016-12-01 MED ORDER — LIDOCAINE 5 % EX PTCH: 1.0000 | MEDICATED_PATCH | CUTANEOUS | 0 refills | Status: DC

## 2016-12-01 MED ORDER — DULOXETINE HCL 60 MG PO CPEP: ORAL_CAPSULE | ORAL | 0 refills | Status: DC

## 2016-12-03 ENCOUNTER — Other Ambulatory Visit: Payer: Self-pay | Admitting: Family Medicine

## 2016-12-03 DIAGNOSIS — N63 Unspecified lump in unspecified breast: Secondary | ICD-10-CM

## 2016-12-07 ENCOUNTER — Telehealth: Payer: Self-pay

## 2016-12-07 NOTE — Telephone Encounter (Signed)
She sent a message saying that her insurance would not pay for lidocaine ointment, but will pay for a lidoderm patch, so we can order the lidoderm patch.  Thanks.

## 2016-12-07 NOTE — Telephone Encounter (Signed)
Recieved faxed medication prior authorization request for this patient for Lidoderm patches, upon review of the patients last note, this was noted"  3. Peripheral neuropathy             Currently being workup by PCP             Will order lidoderm ointment  Are we trying to get patches or ointment approved for this patient?  Please advise

## 2016-12-08 NOTE — Telephone Encounter (Signed)
Sybil forwarded a patient e-mail stating that a prior approval is needed for lidoderm patches.  The only diagnosis provided in the problems list is Cholelithiasis. As you know, diabetic neuropathy and post herpetic neuralgia, are the only proven diagnosis to obtain a favorable response to a prior authorization.

## 2016-12-08 NOTE — Telephone Encounter (Signed)
I discussed this with Bruce.  We were both surprised, but that is what she has said.  Low back pain with sciatica and peripheral neuropathy are also diagnosis that are more likely to be approved.  Thanks.

## 2016-12-09 NOTE — Telephone Encounter (Signed)
Prior authorization submitted, awaiting results

## 2016-12-10 NOTE — Telephone Encounter (Signed)
Prior authorization denied, perhaps we can recommend [facility] lidocaine 4% patches

## 2016-12-11 NOTE — Telephone Encounter (Signed)
Yes, we can reach recommend the OTC patches. Thanks.

## 2016-12-14 NOTE — Telephone Encounter (Signed)
I left message on dpr voicemail with information about OTC patches.

## 2016-12-20 HISTORY — PX: CARPAL TUNNEL RELEASE: SHX101

## 2017-01-24 ENCOUNTER — Other Ambulatory Visit: Payer: Self-pay | Admitting: Physical Medicine & Rehabilitation

## 2017-01-26 ENCOUNTER — Encounter: Payer: Medicare HMO | Admitting: Physical Medicine & Rehabilitation

## 2017-01-27 ENCOUNTER — Other Ambulatory Visit: Payer: Self-pay

## 2017-01-27 ENCOUNTER — Encounter: Payer: Medicare HMO | Attending: Physical Medicine & Rehabilitation | Admitting: Physical Medicine & Rehabilitation

## 2017-01-27 ENCOUNTER — Encounter: Payer: Self-pay | Admitting: Physical Medicine & Rehabilitation

## 2017-01-27 VITALS — BP 93/67 | HR 91

## 2017-01-27 DIAGNOSIS — M545 Low back pain: Secondary | ICD-10-CM | POA: Diagnosis not present

## 2017-01-27 DIAGNOSIS — R42 Dizziness and giddiness: Secondary | ICD-10-CM | POA: Insufficient documentation

## 2017-01-27 DIAGNOSIS — G629 Polyneuropathy, unspecified: Secondary | ICD-10-CM | POA: Diagnosis not present

## 2017-01-27 DIAGNOSIS — M533 Sacrococcygeal disorders, not elsewhere classified: Secondary | ICD-10-CM | POA: Diagnosis not present

## 2017-01-27 DIAGNOSIS — G479 Sleep disorder, unspecified: Secondary | ICD-10-CM | POA: Diagnosis not present

## 2017-01-27 DIAGNOSIS — G894 Chronic pain syndrome: Secondary | ICD-10-CM | POA: Insufficient documentation

## 2017-01-27 DIAGNOSIS — M792 Neuralgia and neuritis, unspecified: Secondary | ICD-10-CM | POA: Diagnosis not present

## 2017-01-27 DIAGNOSIS — G43909 Migraine, unspecified, not intractable, without status migrainosus: Secondary | ICD-10-CM | POA: Diagnosis not present

## 2017-01-27 DIAGNOSIS — M791 Myalgia, unspecified site: Secondary | ICD-10-CM

## 2017-01-27 DIAGNOSIS — M461 Sacroiliitis, not elsewhere classified: Secondary | ICD-10-CM

## 2017-01-27 DIAGNOSIS — G2581 Restless legs syndrome: Secondary | ICD-10-CM

## 2017-01-27 NOTE — Progress Notes (Signed)
Subjective:    Patient ID: Wanda Mcintosh, female    DOB: 1965/10/27, 51 y.o.   MRN: [id number]  HPI  51 y/o female with pmh of chronic pain syndrome, b/l SI joint pain, migraine, vertigo presents for follow up of low back pain.   Initially stated it started 12/2008, progressively getting worse after a fall.  Heat improves the pain.  Standing, prolonged walking.  Dull and intense.  Non-radiating.  Constant.  Denies associated weakness and numbness.  Had steroid injections in SI.  Lumbar ESIs and RFAs do not last.  Hydrocodone helps. Mechanical fall in November.  Pain limits cooking and cleaning. Pt current on disability.  Last visit 11/24/16. Since last visit, she continues to take Diclofenac.  She takes Tizanidine about 1-2/week. She continues to ambulate.  She is awaiting a car for pool therapy. Lidoderm ointment helps. She is having benefit with requip.    Pain Inventory Average Pain 3 Pain Right Now 3 My pain is intermittent, dull and aching  In the last 24 hours, has pain interfered with the following? General activity 5 Relation with others 3 Enjoyment of life 7 What TIME of day is your pain at its worst? morning  Sleep (in general) Fair  Pain is worse with: walking, sitting, inactivity, standing and some activites Pain improves with: rest and injections Relief from Meds: 9  Mobility walk without assistance how many minutes can you walk? 10 ability to climb steps?  yes do you drive?  yes transfers alone Do you have any goals in this area?  yes  Function disabled: date disabled . I need assistance with the following:  household duties and shopping  Neuro/Psych No problems in this area  Prior Studies Any changes since last visit?  no  Physicians involved in your care Any changes since last visit?  no   Family History  Problem Relation Age of Onset  . Hypertension Mother   . Colon polyps Father   . Hypertension Father   . Diabetes Maternal Grandmother   .  Diabetes Paternal Grandmother   . Prostate cancer Paternal Grandfather   . Diabetes Maternal Aunt   . Irritable bowel syndrome Son   . Colon polyps Paternal Uncle   . Prostate cancer Paternal Uncle   . Colon polyps Paternal Uncle    Social History   Socioeconomic History  . Marital status: Single    Spouse name: None  . Number of children: 3  . Years of education: None  . Highest education level: None  Social Needs  . Financial resource strain: None  . Food insecurity - worry: None  . Food insecurity - inability: None  . Transportation needs - medical: None  . Transportation needs - non-medical: None  Occupational History  . Occupation: disabled/Nurse  Tobacco Use  . Smoking status: Never Smoker  . Smokeless tobacco: Never Used  . Tobacco comment: smoked in 7yh grade, occ alcohol  Substance and Sexual Activity  . Alcohol use: Yes    Comment: occasional  . Drug use: No  . Sexual activity: Yes    Birth control/protection: Coitus interruptus  Other Topics Concern  . None  Social History Narrative  . None   Past Surgical History:  Procedure Laterality Date  . BREAST SURGERY Bilateral    rt fibroid 90. lft 94  . CARPAL TUNNEL RELEASE Left   . HAND SURGERY     cartilage  . LEEP    . TONSILLECTOMY     Past  Medical History:  Diagnosis Date  . Anemia   . Back injury   . Barrett's esophagus   . 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 Chronic back pain   . Complication of anesthesia    possible aggression coming out of anesthesia  . Gallstones   . GERD (gastroesophageal reflux disease)   . Head ache   . Migraine   . Osteoarthritis   . Status post dilation of esophageal narrowing   . Vertigo    BP 93/67   Pulse 91   SpO2 94%   Opioid Risk Score:   Fall Risk Score:  `1  Depression screen PHQ 2/9  Depression screen [facility] 2/9 06/23/2016 05/20/2016 04/16/2016  Decreased Interest 0 0 0  Down, Depressed, Hopeless 0 0 0  PHQ - 2 Score 0 0 0  Altered sleeping - - 3    Tired, decreased energy - - 2  Change in appetite - - 0  Feeling bad or failure about yourself  - - 1  Trouble concentrating - - 0  Moving slowly or fidgety/restless - - 0  Suicidal thoughts - - 0  PHQ-9 Score - - 6  Difficult doing work/chores - - Somewhat difficult    Review of Systems  Constitutional: Negative.   HENT: Negative.   Eyes: Negative.   Respiratory: Negative.   Cardiovascular: Negative.   Gastrointestinal: Positive for abdominal pain.  Endocrine: Negative.   Genitourinary: Negative.   Musculoskeletal: Positive for arthralgias, back pain and gait problem.  Skin: Negative.   Allergic/Immunologic: Negative.   Hematological: Negative.   Psychiatric/Behavioral: Negative.   All other systems reviewed and are negative.     Objective:   Physical Exam Gen: NAD. Vital signs reviewed. Obese HENT: Normocephalic, Atraumatic Eyes: EOMI, No discharge.  Cardio: RRR. No JVD. Pulm: B/l clear to auscultation.  Effort normal Abd: Soft, BS+ MSK:  Gait slightly antalgic  +TTP right SI and left lumbar PSPs  No edema.  Neuro:   Strength  5/5 in all LE myotomes Skin: Warm and Dry. Intact. Psych: Normal mood and behavior.     Assessment & Plan:  51 y/o female with pmh of chronic pain syndrome, b/l SI joint pain, migraine, vertigo presents for follow up low back pain.  1. Chronic low back pain with sciatica  MRI results reviewed 03/2015, showing lumbar facet arthropathy  Did not respond to ESIs and RFA at [place] does not receive benefit from TENS, tyelnol, Robaxin, Chiropracter  Cannot tolerate Mobic, Celebrex, Gabapentin, Baclofen  Cont heat  Cont pool therapy when finances allow  Cont Diclofenac 75 BID  Cont Tizanidine 2mg  BID PRN  Cont Cymbalta to 60mg  daily   Cont aerobic exercise, states she does 2/week for 30 minutes  Pt had PT in 2012, will consider referral in future   Will consider Lyrica, but given hx of edema with other meds, will try to avoid   Will  consider topical meds in future  Currently, pt states she feels the best she has felt in a long time, and would like not like to make any joints   2. Sleep disturbance  Pt stopped elavil  See #7  3. Peripheral neuropathy  Currently being workup by PCP  Cont lidoderm ointment  4. Morbid obesity  Pt saw dietitian   Cont to encourage weight loss, again  5. B/l sacroiliitis, L>R  Good benefit from SI injections at [facility] bracing, which is helping. Encouraged limited use  6. Myalgias  Will cont to hold off on trigger point injections at this time  7. RLS  Cont requip 0.25mg  qhs

## 2017-02-01 ENCOUNTER — Other Ambulatory Visit: Payer: Self-pay | Admitting: *Deleted

## 2017-02-01 MED ORDER — ROPINIROLE HCL 0.25 MG PO TABS: 0.2500 mg | ORAL_TABLET | Freq: Every day | ORAL | 3 refills | Status: DC

## 2017-03-21 ENCOUNTER — Other Ambulatory Visit: Payer: Self-pay | Admitting: Physical Medicine & Rehabilitation

## 2017-04-04 ENCOUNTER — Other Ambulatory Visit: Payer: Self-pay | Admitting: Physical Medicine & Rehabilitation

## 2017-04-27 ENCOUNTER — Encounter: Payer: Self-pay | Admitting: Physical Medicine & Rehabilitation

## 2017-05-02 ENCOUNTER — Encounter: Payer: Self-pay | Admitting: Physical Medicine & Rehabilitation

## 2017-05-04 ENCOUNTER — Encounter: Payer: Medicare HMO | Attending: Physical Medicine & Rehabilitation | Admitting: Physical Medicine & Rehabilitation

## 2017-05-04 ENCOUNTER — Encounter: Payer: Self-pay | Admitting: Physical Medicine & Rehabilitation

## 2017-05-04 VITALS — BP 137/87 | HR 91

## 2017-05-04 DIAGNOSIS — G479 Sleep disorder, unspecified: Secondary | ICD-10-CM

## 2017-05-04 DIAGNOSIS — M545 Low back pain, unspecified: Secondary | ICD-10-CM

## 2017-05-04 DIAGNOSIS — M533 Sacrococcygeal disorders, not elsewhere classified: Secondary | ICD-10-CM | POA: Insufficient documentation

## 2017-05-04 DIAGNOSIS — M791 Myalgia, unspecified site: Secondary | ICD-10-CM

## 2017-05-04 DIAGNOSIS — G43909 Migraine, unspecified, not intractable, without status migrainosus: Secondary | ICD-10-CM | POA: Diagnosis not present

## 2017-05-04 DIAGNOSIS — R42 Dizziness and giddiness: Secondary | ICD-10-CM | POA: Insufficient documentation

## 2017-05-04 DIAGNOSIS — G894 Chronic pain syndrome: Secondary | ICD-10-CM | POA: Diagnosis present

## 2017-05-04 DIAGNOSIS — M461 Sacroiliitis, not elsewhere classified: Secondary | ICD-10-CM | POA: Diagnosis not present

## 2017-05-04 DIAGNOSIS — M47817 Spondylosis without myelopathy or radiculopathy, lumbosacral region: Secondary | ICD-10-CM

## 2017-05-04 DIAGNOSIS — G629 Polyneuropathy, unspecified: Secondary | ICD-10-CM | POA: Insufficient documentation

## 2017-05-04 DIAGNOSIS — G8929 Other chronic pain: Secondary | ICD-10-CM | POA: Diagnosis not present

## 2017-05-04 NOTE — Progress Notes (Addendum)
Subjective:    Patient ID: Wanda Mcintosh, female    DOB: 1965-11-18, 52 y.o.   MRN: [id number]  HPI  52 y/o female with pmh of chronic pain syndrome, b/l SI joint pain, migraine, vertigo presents for follow up of low back pain.   Initially stated it started 12/2008, progressively getting worse after a fall.  Heat improves the pain.  Standing, prolonged walking.  Dull and intense.  Non-radiating.  Constant.  Denies associated weakness and numbness.  Had steroid injections in SI.  Lumbar ESIs and RFAs do not last.  Hydrocodone helps. Mechanical fall in November.  Pain limits cooking and cleaning. Pt current on disability.  Last visit 01/27/17. Since last visit, pt notes an increase in LFTs and she stopped taking acetaminophen products and Diclofenac.  Her pain has gotten worse.  She continues to heat with benefit, but she states she needs a new heating pad because her current one does not work.  Tizanidine helps with sleep.                 Will consider Lyrica, but given hx of edema with other meds, will try to avoid                       Will consider topical meds in future             Currently, pt states she feels the best she has felt in a long time, and would like not like to make any joints              2. Sleep disturbance             Pt stopped elavil             See #7   3. Peripheral neuropathy             Currently being workup by PCP             Cont lidoderm ointment   4. Morbid obesity             Pt saw dietitian              Cont to encourage weight loss, again   5. B/l sacroiliitis, L>R             Good benefit from SI injections at [facility] bracing, which is helping. Encouraged limited use   6. Myalgias             Will cont to hold off on trigger point injections at this time   7. RLS             Cont requip 0.25mg  qhs       Pain Inventory Average Pain 6 Pain Right Now 3 My pain is intermittent, dull and  aching  In the last 24 hours, has pain interfered with the following? General activity 2 Relation with others 4 Enjoyment of life 2 What TIME of day is your pain at its worst? morning and night  Sleep (in general) Poor  Pain is worse with: walking, bending and standing Pain improves with: heat/ice and medication Relief from Meds: 8  Mobility walk without assistance how many minutes can you walk? 10 ability to climb steps?  yes needs help with transfers Do you have any goals in this area?  yes  Function disabled: date disabled n/a  Neuro/Psych No problems in this area  Prior Studies Any changes since last visit?  no  Physicians involved in your care Any changes since last visit?  no   Family History  Problem Relation Age of Onset  . Hypertension Mother   . Colon polyps Father   . Hypertension Father   . Diabetes Maternal Grandmother   . Diabetes Paternal Grandmother   . Prostate cancer Paternal Grandfather   . Diabetes Maternal Aunt   . Irritable bowel syndrome Son   . Colon polyps Paternal Uncle   . Prostate cancer Paternal Uncle   . Colon polyps Paternal Uncle    Social History   Socioeconomic History  . Marital status: Single    Spouse name: None  . Number of children: 3  . Years of education: None  . Highest education level: None  Social Needs  . Financial resource strain: None  . Food insecurity - worry: None  . Food insecurity - inability: None  . Transportation needs - medical: None  . Transportation needs - non-medical: None  Occupational History  . Occupation: disabled/Nurse  Tobacco Use  . Smoking status: Never Smoker  . Smokeless tobacco: Never Used  . Tobacco comment: smoked in 7yh grade, occ alcohol  Substance and Sexual Activity  . Alcohol use: Yes    Comment: occasional  . Drug use: No  . Sexual activity: Yes    Birth control/protection: Coitus interruptus  Other Topics Concern  . None  Social History Narrative  . None    Past Surgical History:  Procedure Laterality Date  . BREAST SURGERY Bilateral    rt fibroid 90. lft 94  . CARPAL TUNNEL RELEASE Left   . CHOLECYSTECTOMY  07/16/2011   Procedure: LAPAROSCOPIC CHOLECYSTECTOMY WITH INTRAOPERATIVE CHOLANGIOGRAM;  Surgeon: Merrie Roof, MD;  Location: [facility] [facility];  Service: General;  Laterality: N/A;  . HAND SURGERY     cartilage  . LEEP    . TONSILLECTOMY     Past Medical History:  Diagnosis Date  . Anemia   . Back injury   . Barrett's esophagus   . 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 Chronic back pain   . Complication of anesthesia    possible aggression coming out of anesthesia  . Gallstones   . GERD (gastroesophageal reflux disease)   . Head ache   . Migraine   . Osteoarthritis   . Status post dilation of esophageal narrowing   . Vertigo    BP 137/87   Pulse 91   SpO2 93%   Opioid Risk Score:   Fall Risk Score:  `1  Depression screen PHQ 2/9  Depression screen [facility] 2/9 06/23/2016 05/20/2016 04/16/2016  Decreased Interest 0 0 0  Down, Depressed, Hopeless 0 0 0  PHQ - 2 Score 0 0 0  Altered sleeping - - 3  Tired, decreased energy - - 2  Change in appetite - - 0  Feeling bad or failure about yourself  - - 1  Trouble concentrating - - 0  Moving slowly or fidgety/restless - - 0  Suicidal thoughts - - 0  PHQ-9 Score - - 6  Difficult doing work/chores - - Somewhat difficult    Review of Systems  Constitutional: Positive for diaphoresis.  HENT: Negative.   Eyes: Negative.   Respiratory: Negative.   Cardiovascular: Negative.   Gastrointestinal: Positive for abdominal pain and constipation.  Endocrine: Negative.   Genitourinary: Negative.  Skin: Negative.   Allergic/Immunologic: Negative.   Neurological: Positive for weakness.  Hematological: Negative.   Psychiatric/Behavioral: Negative.   All other systems reviewed and are negative.     Objective:   Physical Exam Gen: NAD. Vital signs reviewed. Obese HENT: Normocephalic,  Atraumatic Eyes: EOMI, No discharge.  Cardio: RRR. No JVD. Pulm: B/l clear to auscultation.  Effort normal Abd: Soft, BS+ MSK:  Gait slightly antalgic  +TTP right SI and right lumbar PSPs  No edema.  Neuro:   Strength  5/5 in all LE myotomes Skin: Warm and Dry. Intact. Psych: Normal mood and behavior.     Assessment & Plan:  53 y/o female with pmh of chronic pain syndrome, b/l SI joint pain, migraine, vertigo presents for follow up low back pain.  1. 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  MRI results reviewed 03/2015, showing lumbar facet arthropathy  Spinal anatomy reviewed with patient  Did not respond to ESIs and RFA at [place] does not receive benefit from TENS, tyelnol, Robaxin, Chiropracter  Cannot tolerate Mobic, Celebrex, Gabapentin, Baclofen  Lidoderm ointment, biofreeze, lidoderm patch OTC with minimal benefit  Cont heat  Cont pool therapy when finances allow  Diclofenac 75 BID on hold due to elevated LFTs  Tizanidine 2mg  BID PRN on hold due to elevated LFTs  Cont Cymbalta to 60mg  daily   Cont aerobic exercise, states she does 2/week for 30 minutes  Pt had PT in 2012, will consider referral in future   Will consider Lyrica, but given hx of edema with other meds, will try to avoid   Will consider topical meds in future  Currently, pt states she feels the best she has felt in a long time, and would like not like to make any joints  Will order L-spine   2. Sleep disturbance  Pt stopped elavil  See #7  3. Peripheral neuropathy  Currently being workup by PCP  Cont lidoderm ointment  4. Morbid obesity  Pt saw dietitian   Cont to encourage weight loss, again   Discussed breast reduction surgery, pt working with PCP on weight loss  5. B/l sacroiliitis, L>R  Good benefit from SI injections at [facility] bracing, which is helping. Encouraged limited use  6. Myalgias  Will perform trigger point injections   7. RLS  Cont  requip 0.25mg  qhs   Trigger point injection procedure note: Trigger Point Injection: Written consent was obtained for the patient. Trigger points were identified of b/l lumbosacral PSPs muscles. The areas were cleaned with alcohol, vapocoolant spray applied, and each of  these trigger points were injected with 1 cc of 0.5% Marcaine. Needle draw back was performed. There were no complications from the procedure, and it was well tolerated.

## 2017-05-07 ENCOUNTER — Encounter (HOSPITAL_COMMUNITY): Payer: Self-pay | Admitting: *Deleted

## 2017-05-07 ENCOUNTER — Emergency Department (HOSPITAL_COMMUNITY)
Admission: EM | Admit: 2017-05-07 | Discharge: 2017-05-07 | Disposition: A | Discharge status: Home / Self Care | Payer: Medicare HMO | Attending: Emergency Medicine | Admitting: Emergency Medicine

## 2017-05-07 ENCOUNTER — Other Ambulatory Visit: Payer: Self-pay

## 2017-05-07 DIAGNOSIS — Z79899 Other long term (current) drug therapy: Secondary | ICD-10-CM | POA: Insufficient documentation

## 2017-05-07 DIAGNOSIS — M25562 Pain in left knee: Secondary | ICD-10-CM | POA: Insufficient documentation

## 2017-05-07 DIAGNOSIS — Z8541 Personal history of malignant neoplasm of cervix uteri: Secondary | ICD-10-CM | POA: Insufficient documentation

## 2017-05-07 MED ORDER — TRAMADOL HCL 50 MG PO TABS: 50.0000 mg | ORAL_TABLET | Freq: Four times a day (QID) | ORAL | 0 refills | Status: DC | PRN

## 2017-05-07 NOTE — Discharge Instructions (Signed)
Please continue to rest, ice, and wear knee sleeve Continue your home pain medicines You can try Lidocaine cream which is over the counter as well Take Tramadol as needed for pain Follow up with orthopedics

## 2017-05-07 NOTE — ED Provider Notes (Signed)
[facility] EMERGENCY DEPARTMENT Provider Note   CSN: [id number] Arrival date & time: 05/07/17  1638     History   Chief Complaint Chief Complaint  Patient presents with  . Knee Pain    HPI Wanda Mcintosh is a 52 y.o. female who presents with left knee pain.  Past medical history significant for chronic back pain, obesity.  She states that over the past week she has had gradually worsening left knee pain over the anterior portion of the knee.  Getting up from sitting and extending the knee makes the pain worse.  It feels like her knee is being "ripped apart". Nothing has made the pain better.  She has been resting, elevating the knee without significant relief.  She denies any known trauma but states that she recently came off the diclofenac due to abnormalities in her blood work a week ago as well and so is unsure if maybe she injured the knee before that and just did not notice. She denies any hx of knee pain or knee surgeries. She has not had any significant swelling or instability of the knee.  No redness or warmth.  She goes to [facility] for her back.  HPI  Past Medical History:  Diagnosis Date  . Anemia   . Back injury   . Barrett's esophagus   . 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 Chronic back pain   . Complication of anesthesia    possible aggression coming out of anesthesia  . Gallstones   . GERD (gastroesophageal reflux disease)   . Head ache   . Migraine   . Osteoarthritis   . Status post dilation of esophageal narrowing   . Vertigo     Patient Active Problem List   Diagnosis Date Noted  . Cholelithiasis 06/21/2011    Past Surgical History:  Procedure Laterality Date  . BREAST SURGERY Bilateral    rt fibroid 90. lft 94  . CARPAL TUNNEL RELEASE Left   . CHOLECYSTECTOMY  07/16/2011   Procedure: LAPAROSCOPIC CHOLECYSTECTOMY WITH INTRAOPERATIVE CHOLANGIOGRAM;  Surgeon: Merrie Roof, MD;  Location: [place];  Service: General;   Laterality: N/A;  . HAND SURGERY     cartilage  . LEEP    . TONSILLECTOMY      OB History    Gravida Para Term Preterm AB Living   4 3     1      SAB TAB Ectopic Multiple Live Births   1               Home Medications    Prior to Admission medications   Medication Sig Start Date End Date Taking? Authorizing Provider  aspirin-acetaminophen-caffeine (EXCEDRIN MIGRAINE) [id number] MG tablet Take by mouth every 6 (six) hours as needed for headache.    [provider]  b complex vitamins tablet Take 1 tablet by mouth daily.    [provider]  DULoxetine (CYMBALTA) 60 MG capsule TAKE 1 CAPSULE EVERY DAY 03/23/17   Jamse Arn, MD  lidocaine (LIDODERM) 5 % Place 1 patch onto the skin daily. Remove & Discard patch within 12 hours or as directed by MD 12/01/16   Jamse Arn, MD  magnesium oxide (MAG-OX) 400 MG tablet Take 400 mg by mouth daily.    [provider]  omeprazole (PRILOSEC) 20 MG capsule Take 20 mg by mouth daily.    [provider]  potassium chloride (K-DUR,KLOR-CON) 10 MEQ tablet Take 10  mEq by mouth 2 (two) times daily.    [provider]  SUMAtriptan (IMITREX) 100 MG tablet Take 100 mg by mouth daily as needed for migraine. May repeat in 2 hours if headache persists or recurs.    [provider]  traMADol (ULTRAM) 50 MG tablet Take 1 tablet (50 mg total) by mouth every 6 (six) hours as needed. 05/07/17   Recardo Evangelist, PA-C    Family History Family History  Problem Relation Age of Onset  . Hypertension Mother   . Colon polyps Father   . Hypertension Father   . Diabetes Maternal Grandmother   . Diabetes Paternal Grandmother   . Prostate cancer Paternal Grandfather   . Diabetes Maternal Aunt   . Irritable bowel syndrome Son   . Colon polyps Paternal Uncle   . Prostate cancer Paternal Uncle   . Colon polyps Paternal Uncle     Social History Social History   Tobacco Use  . Smoking status: Never  Smoker  . Smokeless tobacco: Never Used  . Tobacco comment: smoked in 7yh grade, occ alcohol  Substance Use Topics  . Alcohol use: Yes    Comment: occasional  . Drug use: No     Allergies   Baclofen; Celebrex [celecoxib]; Flexeril [cyclobenzaprine]; Gabapentin; Nsaids; Ondansetron hcl; and Zofran   Review of Systems Review of Systems  Musculoskeletal: Positive for arthralgias. Negative for joint swelling.  Skin: Negative for wound.  Neurological: Negative for weakness and numbness.  All other systems reviewed and are negative.    Physical Exam Updated Vital Signs BP 125/77 (BP Location: Right Arm)   Pulse 82   Temp 99.2 F (37.3 C) (Oral)   Resp 18   SpO2 97%   Physical Exam  Constitutional: She is oriented to person, place, and time. She appears well-developed and well-nourished. No distress.  Obese  HENT:  Head: Normocephalic and atraumatic.  Eyes: Conjunctivae are normal. Pupils are equal, round, and reactive to light. Right eye exhibits no discharge. Left eye exhibits no discharge. No scleral icterus.  Neck: Normal range of motion.  Cardiovascular: Normal rate.  Pulmonary/Chest: Effort normal. No respiratory distress.  Abdominal: She exhibits no distension.  Musculoskeletal:  Left knee: No obvious swelling, deformity, or warmth. Tenderness to palpation of quadraceps tendon. No joint line tenderness or patellar tenderness. FROM. 5/5 strength. N/V intact.   Neurological: She is alert and oriented to person, place, and time.  Skin: Skin is warm and dry.  Psychiatric: She has a normal mood and affect. Her behavior is normal.  Nursing note and vitals reviewed.    ED Treatments / Results  Labs (all labs ordered are listed, but only abnormal results are displayed) Labs Reviewed - No data to display  EKG  EKG Interpretation None       Radiology No results found.  Procedures Procedures (including critical care time)  Medications Ordered in  ED Medications - No data to display   Initial Impression / Assessment and Plan / ED Course  I have reviewed the triage vital signs and the nursing notes.  Pertinent labs & imaging results that were available during my care of the patient were reviewed by me and considered in my medical decision making (see chart for details).  52 year old female presents with left knee pain. Likely Quadriceps tendonitis likely due to obesity and relative inactivity. On exam there are no obvious signs of infection. Vitals are normal. Imaging was deferred since there was no injury and pain  seems to be more muscular related. She will be given a knee sleeve and rx for Tramadol and advised to follow up with Orthopedics.  Final Clinical Impressions(s) / ED Diagnoses   Final diagnoses:  Acute pain of left knee    ED Discharge Orders        Ordered    traMADol (ULTRAM) 50 MG tablet  Every 6 hours PRN     05/07/17 1840       Recardo Evangelist, PA-C 05/07/17 Eino Farber, MD 05/08/17 [phone]

## 2017-05-07 NOTE — ED Triage Notes (Signed)
Pt reports left knee pain and swelling for several days. No injury. Ambulatory at triage.

## 2017-05-07 NOTE — ED Notes (Signed)
Declined W/C at D/C and was escorted to lobby by RN. 

## 2017-05-12 ENCOUNTER — Encounter: Payer: Self-pay | Admitting: Physical Medicine & Rehabilitation

## 2017-05-12 ENCOUNTER — Encounter (HOSPITAL_BASED_OUTPATIENT_CLINIC_OR_DEPARTMENT_OTHER): Payer: Medicare HMO | Admitting: Physical Medicine & Rehabilitation

## 2017-05-12 VITALS — BP 119/68 | HR 90

## 2017-05-12 DIAGNOSIS — M791 Myalgia, unspecified site: Secondary | ICD-10-CM | POA: Diagnosis not present

## 2017-05-12 DIAGNOSIS — G894 Chronic pain syndrome: Secondary | ICD-10-CM | POA: Diagnosis not present

## 2017-05-12 NOTE — Progress Notes (Signed)
Trigger point injection procedure note: Trigger Point Injection: Written consent was obtained for the patient. Trigger points were identified of b/l lumbosacral PSPs and gluteal muscles. The areas were cleaned with alcohol, vapocoolant spray applied, and each of  these trigger points were injected with 1 cc of 0.5% Marcaine (x7). Needle draw back was performed. There were no complications from the procedure, and it was well tolerated.

## 2017-05-14 ENCOUNTER — Other Ambulatory Visit: Payer: Self-pay | Admitting: Physical Medicine & Rehabilitation

## 2017-05-17 ENCOUNTER — Ambulatory Visit: Payer: Medicare HMO | Admitting: Gastroenterology

## 2017-05-18 ENCOUNTER — Other Ambulatory Visit: Payer: Self-pay | Admitting: Family Medicine

## 2017-05-18 DIAGNOSIS — R7401 Elevation of levels of liver transaminase levels: Secondary | ICD-10-CM

## 2017-05-18 DIAGNOSIS — R740 Nonspecific elevation of levels of transaminase and lactic acid dehydrogenase [LDH]: Principal | ICD-10-CM

## 2017-05-20 ENCOUNTER — Other Ambulatory Visit: Payer: Self-pay | Admitting: Physical Medicine & Rehabilitation

## 2017-05-27 ENCOUNTER — Encounter: Payer: Medicare HMO | Admitting: Physical Medicine & Rehabilitation

## 2017-05-31 ENCOUNTER — Other Ambulatory Visit: Payer: Medicare HMO

## 2017-06-01 ENCOUNTER — Encounter: Payer: Self-pay | Admitting: Physical Medicine & Rehabilitation

## 2017-06-01 ENCOUNTER — Encounter: Payer: Medicare HMO | Attending: Physical Medicine & Rehabilitation | Admitting: Physical Medicine & Rehabilitation

## 2017-06-01 VITALS — BP 108/76 | HR 80 | Resp 14

## 2017-06-01 DIAGNOSIS — M47817 Spondylosis without myelopathy or radiculopathy, lumbosacral region: Secondary | ICD-10-CM

## 2017-06-01 DIAGNOSIS — G43909 Migraine, unspecified, not intractable, without status migrainosus: Secondary | ICD-10-CM | POA: Diagnosis not present

## 2017-06-01 DIAGNOSIS — M545 Low back pain, unspecified: Secondary | ICD-10-CM

## 2017-06-01 DIAGNOSIS — G479 Sleep disorder, unspecified: Secondary | ICD-10-CM | POA: Diagnosis not present

## 2017-06-01 DIAGNOSIS — G629 Polyneuropathy, unspecified: Secondary | ICD-10-CM | POA: Diagnosis not present

## 2017-06-01 DIAGNOSIS — M791 Myalgia, unspecified site: Secondary | ICD-10-CM

## 2017-06-01 DIAGNOSIS — G8929 Other chronic pain: Secondary | ICD-10-CM

## 2017-06-01 DIAGNOSIS — M792 Neuralgia and neuritis, unspecified: Secondary | ICD-10-CM | POA: Diagnosis not present

## 2017-06-01 DIAGNOSIS — M533 Sacrococcygeal disorders, not elsewhere classified: Secondary | ICD-10-CM | POA: Diagnosis not present

## 2017-06-01 DIAGNOSIS — M25562 Pain in left knee: Secondary | ICD-10-CM

## 2017-06-01 DIAGNOSIS — M461 Sacroiliitis, not elsewhere classified: Secondary | ICD-10-CM | POA: Diagnosis not present

## 2017-06-01 DIAGNOSIS — G894 Chronic pain syndrome: Secondary | ICD-10-CM | POA: Diagnosis present

## 2017-06-01 DIAGNOSIS — R42 Dizziness and giddiness: Secondary | ICD-10-CM | POA: Diagnosis not present

## 2017-06-01 MED ORDER — DICLOFENAC SODIUM 1 % TD GEL: 2.0000 g | Freq: Four times a day (QID) | TRANSDERMAL | 1 refills | Status: DC

## 2017-06-01 NOTE — Progress Notes (Signed)
Subjective:    Patient ID: Wanda Mcintosh, female    DOB: June 13, 1965, 52 y.o.   MRN: [id number]  HPI  52 y/o female with pmh of chronic pain syndrome, b/l SI joint pain, migraine, vertigo presents for follow up of low back pain.   Initially stated it started 12/2008, progressively getting worse after a fall.  Heat improves the pain.  Standing, prolonged walking.  Dull and intense.  Non-radiating.  Constant.  Denies associated weakness and numbness.  Had steroid injections in SI.  Lumbar ESIs and RFAs do not last.  Hydrocodone helps. Mechanical fall in November.  Pain limits cooking and cleaning. Pt current on disability.  Last visit 05/04/16. Pt had trigger point injections at that time.  Since last visit, pt states she received good benefit.  She notes her LFTs improved after d/cing Voltaren. She continues to take Tizanidine.  She is walking for exercise.  She did not obtain xray of her back. She has not lost any weight.    Pain Inventory Average Pain 4 Pain Right Now 3 My pain is intermittent, dull and aching  In the last 24 hours, has pain interfered with the following? General activity 5 Relation with others 2 Enjoyment of life 6 What TIME of day is your pain at its worst? morning  Sleep (in general) Poor  Pain is worse with: bending, inactivity, standing and some activites Pain improves with: rest, heat/ice and injections Relief from Meds: n/a  Mobility walk without assistance how many minutes can you walk? 15 ability to climb steps?  yes do you drive?  yes transfers alone Do you have any goals in this area?  yes  Function disabled: date disabled .  Neuro/Psych No problems in this area  Prior Studies Any changes since last visit?  no  Physicians involved in your care Any changes since last visit?  no   Family History  Problem Relation Age of Onset  . Hypertension Mother   . Colon polyps Father   . Hypertension Father   . Diabetes Maternal Grandmother   .  Diabetes Paternal Grandmother   . Prostate cancer Paternal Grandfather   . Diabetes Maternal Aunt   . Irritable bowel syndrome Son   . Colon polyps Paternal Uncle   . Prostate cancer Paternal Uncle   . Colon polyps Paternal Uncle    Social History   Socioeconomic History  . Marital status: Single    Spouse name: None  . Number of children: 3  . Years of education: None  . Highest education level: None  Social Needs  . Financial resource strain: None  . Food insecurity - worry: None  . Food insecurity - inability: None  . Transportation needs - medical: None  . Transportation needs - non-medical: None  Occupational History  . Occupation: disabled/Nurse  Tobacco Use  . Smoking status: Never Smoker  . Smokeless tobacco: Never Used  . Tobacco comment: smoked in 7yh grade, occ alcohol  Substance and Sexual Activity  . Alcohol use: Yes    Comment: occasional  . Drug use: No  . Sexual activity: Yes    Birth control/protection: Coitus interruptus  Other Topics Concern  . None  Social History Narrative  . None   Past Surgical History:  Procedure Laterality Date  . BREAST SURGERY Bilateral    rt fibroid 90. lft 94  . CARPAL TUNNEL RELEASE Left   . CHOLECYSTECTOMY  07/16/2011   Procedure: LAPAROSCOPIC CHOLECYSTECTOMY WITH INTRAOPERATIVE CHOLANGIOGRAM;  Surgeon: Eddie Dibbles  Jenna Luo, MD;  Location: [facility];  Service: General;  Laterality: N/A;  . HAND SURGERY     cartilage  . LEEP    . TONSILLECTOMY     Past Medical History:  Diagnosis Date  . Anemia   . Back injury   . Barrett's esophagus   . 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 Chronic back pain   . Complication of anesthesia    possible aggression coming out of anesthesia  . Gallstones   . GERD (gastroesophageal reflux disease)   . Head ache   . Migraine   . Osteoarthritis   . Status post dilation of esophageal narrowing   . Vertigo    BP 108/76 (BP Location: Left Arm, Patient Position: Sitting, Cuff Size: Large)    Pulse 80   Resp 14   SpO2 96%   Opioid Risk Score:   Fall Risk Score:  `1  Depression screen PHQ 2/9  Depression screen [facility] 2/9 06/23/2016 05/20/2016 04/16/2016  Decreased Interest 0 0 0  Down, Depressed, Hopeless 0 0 0  PHQ - 2 Score 0 0 0  Altered sleeping - - 3  Tired, decreased energy - - 2  Change in appetite - - 0  Feeling bad or failure about yourself  - - 1  Trouble concentrating - - 0  Moving slowly or fidgety/restless - - 0  Suicidal thoughts - - 0  PHQ-9 Score - - 6  Difficult doing work/chores - - Somewhat difficult    Review of Systems  Constitutional: Positive for diaphoresis.  HENT: Negative.   Eyes: Negative.   Respiratory: Negative.   Cardiovascular: Negative.   Gastrointestinal: Negative.   Endocrine: Negative.   Genitourinary: Negative.   Musculoskeletal: Positive for arthralgias, back pain and gait problem.  Skin: Positive for rash.  Allergic/Immunologic: Negative.   Hematological: Negative.   Psychiatric/Behavioral: Negative.   All other systems reviewed and are negative.     Objective:   Physical Exam Gen: NAD. Vital signs reviewed. Obese HENT: Normocephalic, Atraumatic Eyes: EOMI, No discharge.  Cardio: RRR. No JVD. Pulm: B/l clear to auscultation.  Effort normal Abd: Soft, BS+ MSK:  Gait slightly antalgic  +TTP left knee, pain with ROM  No edema.  Neuro:   Strength  5/5 in all LE myotomes Skin: Warm and Dry. Intact. Psych: Normal mood and behavior.     Assessment & Plan:  52 y/o female with pmh of chronic pain syndrome, b/l SI joint pain, migraine, vertigo presents for follow up low back pain.  1. 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             MRI results reviewed 03/2015, showing lumbar facet arthropathy             Spinal anatomy reviewed with patient             Did not respond to ESIs and RFA at [place] does not receive benefit from TENS, tyelnol, Robaxin, Chiropracter             Cannot  tolerate Mobic, Naproxen, Celebrex, Gabapentin, Baclofen             Lidoderm ointment, biofreeze, lidoderm patch OTC with minimal benefit             Cont heat             Cont pool therapy when finances  allow   May resume PRN Diclofenac 75 BID    Cont Tizanidine 2mg  BID PRN              Cont Cymbalta to 60mg  daily              Cont aerobic exercise, states she does 2/week for 30 minutes             Pt had PT in 2012, will refer again             Will consider Lyrica, but given hx of edema with other meds, will try to avoid                       Will consider topical meds in future             Currently, pt states she feels the best she has felt in a long time, and would like not like to make any joints              2. Sleep disturbance             Pt stopped elavil             See #7  3. Peripheral neuropathy             Idiopathic             Cont lidoderm ointment  4. Morbid obesity             Pt saw dietitian              Cont to encourage weight loss, again             Discussed breast reduction surgery, pt working with PCP on weight loss, reminded again  5. B/l sacroiliitis, L>R             Good benefit from SI injections at [facility] bracing, which is helping. Encouraged limited use  6. Myalgias    Good benefit with trigger points in back  7. RLS             Cont requip 0.25mg  qhs  8. Left knee pain    Due to tendonitis   Will order Voltaren gel

## 2017-06-07 ENCOUNTER — Ambulatory Visit
Admission: RE | Admit: 2017-06-07 | Discharge: 2017-06-07 | Disposition: A | Discharge status: Home / Self Care | Payer: Medicare HMO | Source: Ambulatory Visit | Attending: Family Medicine | Admitting: Family Medicine

## 2017-06-07 DIAGNOSIS — R7401 Elevation of levels of liver transaminase levels: Secondary | ICD-10-CM

## 2017-06-07 DIAGNOSIS — R740 Nonspecific elevation of levels of transaminase and lactic acid dehydrogenase [LDH]: Principal | ICD-10-CM

## 2017-06-15 ENCOUNTER — Other Ambulatory Visit: Payer: Self-pay

## 2017-06-15 ENCOUNTER — Encounter: Payer: Self-pay | Admitting: Physical Therapy

## 2017-06-15 ENCOUNTER — Ambulatory Visit: Payer: Medicare HMO | Attending: Physical Medicine & Rehabilitation | Admitting: Physical Therapy

## 2017-06-15 DIAGNOSIS — G8929 Other chronic pain: Secondary | ICD-10-CM

## 2017-06-15 DIAGNOSIS — M545 Low back pain: Secondary | ICD-10-CM | POA: Insufficient documentation

## 2017-06-15 DIAGNOSIS — M25652 Stiffness of left hip, not elsewhere classified: Secondary | ICD-10-CM | POA: Insufficient documentation

## 2017-06-15 DIAGNOSIS — M6281 Muscle weakness (generalized): Secondary | ICD-10-CM | POA: Insufficient documentation

## 2017-06-15 DIAGNOSIS — R293 Abnormal posture: Secondary | ICD-10-CM | POA: Diagnosis present

## 2017-06-15 DIAGNOSIS — M25651 Stiffness of right hip, not elsewhere classified: Secondary | ICD-10-CM | POA: Insufficient documentation

## 2017-06-15 NOTE — Patient Instructions (Signed)
   FROG Hip adductor and groin stretch supine  Lying supine with the knees bent, feet together, let the knees fall out to the side until a stretch is felt through the groin.   Hold for 30 seconds and relax.  Repeat 3 times, twice a day.       Lumbar Rotations   Lying on your back with your knees bent, slowly drop your legs to one side and hold the stretch. Come back to the middle and switch sides. You should feel the stretch in your back on the opposite side that your legs are leaning.  Hold for 10 seconds, then switch sides.  Repeat 3-5 times, twice a day.    Seated Piriformis Stretch  1. While sitting on a chair with your back unsupported, cross the left leg over the right and hinge slightly at the waist.  2. Use your left hand to push down onto the left knee until you feel a deep stretch. This should not be painful.  3. Make sure you are not hunching at your back. You may use your hand to push your knee down.   Hold for 30 seconds; focus on your left hip primarily for now.   Repeat 2-3 times each side, twice a day.    SEATED HAMSTRING STRETCH  While seated, rest your heel on the floor with your knee straight and gently lean forward until a stretch is felt behind your knee/thigh.  You may also put a step stool under the foot of the leg you are stretching to increase the stretch.  Hold for 30 seconds, repeat 2-3 times each leg, twice a day.

## 2017-06-15 NOTE — Therapy (Signed)
[place], [place], [address] Phone: [phone]   Fax:  [phone]  Physical Therapy Evaluation  Patient Details  Name: Wanda Mcintosh MRN: [id number] Date of Birth: 1965/10/07 Referring Provider: Dr. Posey Pronto in May, orthopedic next week    Encounter Date: 06/15/2017  PT End of Session - 06/15/17 1245    Visit Number  1    Number of Visits  17    Date for PT Re-Evaluation  07/13/17    Authorization Type  [employer] medicare/medicaid     PT Start Time  1146    PT Stop Time  1227    PT Time Calculation (min)  41 min    Activity Tolerance  Patient tolerated treatment well    Behavior During Therapy  [facility] for tasks assessed/performed       Past Medical History:  Diagnosis Date  . Anemia   . Back injury   . Barrett's esophagus   . Cancer ([place])    cervica-l leep  . Chronic back pain   . Complication of anesthesia    possible aggression coming out of anesthesia  . Gallstones   . GERD (gastroesophageal reflux disease)   . Head ache   . Migraine   . Osteoarthritis   . Status post dilation of esophageal narrowing   . Vertigo     Past Surgical History:  Procedure Laterality Date  . BREAST SURGERY Bilateral    rt fibroid 90. lft 94  . CARPAL TUNNEL RELEASE Left   . CHOLECYSTECTOMY  07/16/2011   Procedure: LAPAROSCOPIC CHOLECYSTECTOMY WITH INTRAOPERATIVE CHOLANGIOGRAM;  Surgeon: Merrie Roof, MD;  Location: [facility];  Service: General;  Laterality: N/A;  . HAND SURGERY     cartilage  . LEEP    . TONSILLECTOMY      There were no vitals filed for this visit.   Subjective Assessment - 06/15/17 1148    Subjective  patient reports that her MD ordered PT for both her knee and her back, but she is willing to put the knee off a little bit since she is seeing her orthopedist for that soon. Her back is a chronic issue, she is not sure if her knee pain is related to a recent medicine change and plans to  follow up with the orthopedic. She requests to focus on her back for now. She fell in 2010 and this is when her back pain started; she has had PT in the past for her back including strengthening and modalities, she feels like PT helped at the time. She would like to focus on restrengthening her body at this point. She has peripheral neuropathy in her feets, no recent changes in bowel/bladder, no incontinence.     Pertinent History  obesity, chronic back pain, vertigo, knee pain     How long can you sit comfortably?  on a bad day, 15-20 minutes     How long can you stand comfortably?  5-10 minutes     How long can you walk comfortably?  back will bother her after awhile; 15-20 minutes if constant but she has a lot of trouble with starting and stopping     Patient Stated Goals  get stronger in general, build muscles back up     Currently in Pain?  Yes    Pain Score  4     Pain Location  Back    Pain Orientation  Right;Left;Lower    Pain Descriptors / Indicators  Aching    Pain Type  Chronic pain    Pain Radiating Towards  none     Pain Onset  More than a month ago    Pain Frequency  Constant    Aggravating Factors   starting and stopping during walking, standing     Pain Relieving Factors  heat     Effect of Pain on Daily Activities  moderate          [facility] PT Assessment - 06/15/17 0001      Assessment   Medical Diagnosis  back pain, L knee pain     Referring Provider  Dr. Posey Pronto in May, orthopedic next week     Onset Date/Surgical Date  -- back chronic, knee unsure     Next MD Visit  Dr. Posey Pronto in May, orthopedic next week     Prior Therapy  PT for her back when it first happened       Precautions   Precautions  None      Restrictions   Weight Bearing Restrictions  No      Balance Screen   Has the patient fallen in the past 6 months  No    Has the patient had a decrease in activity level because of a fear of falling?   No    Is the patient reluctant to leave their home because of  a fear of falling?   No      Prior Function   Level of Independence  Independent;Independent with basic ADLs;Independent with gait;Independent with transfers    Vocation  On disability    Leisure  walking, gardening, exercise      ROM / Strength   AROM / PROM / Strength  AROM;Strength      AROM   AROM Assessment Site  Lumbar;Hip    Lumbar Flexion  approximately 50% limited; RFIS no changes in symptoms     Lumbar Extension  excessive; REIS causes R hip groin pain     Lumbar - Right Side Bend  WFL     Lumbar - Left Side Bend  [facility]       Strength   Strength Assessment Site  Hip;Knee    Right/Left Hip  Right;Left    Right Hip Flexion  3/5    Right Hip Extension  4-/5    Right Hip ABduction  4+/5    Left Hip Flexion  3/5    Left Hip Extension  4-/5    Left Hip ABduction  3-/5    Right/Left Knee  Left;Right    Right Knee Flexion  4/5    Right Knee Extension  4+/5    Left Knee Flexion  4/5    Left Knee Extension  4+/5      Flexibility   Soft Tissue Assessment /Muscle Length  yes    Hamstrings  moderate limitation, L worse than R     Quadriceps  WFL B     Piriformis  severe limitation, L side more limited than R       Palpation   Palpation comment  tenderness noted bilateral lower lumbar paraspinals, bilateral gluteals but proximal to sacrum, general areas of SI joints       Transfers   Five time sit to stand comments   wide BOS, multiple attempts needed, genearli weakness B LEs               No data recorded  Objective measurements completed on examination: See above findings.  PT Education - 06/15/17 1244    Education provided  Yes    Education Details  prognosis, POC, HEP, importance of mobility and exercise as tolerated in addressing back pain; plan to hold on knee for now and will circle back if we need to     Person(s) Educated  Patient    Methods  Explanation    Comprehension  Verbalized understanding       PT Short Term Goals -  06/15/17 1249      PT SHORT TERM GOAL #1   Title  Patient to be able to maintain upright posture without external cues at least 75% of the time in order to reduce pain and improve functional mechanics     Time  4    Period  Weeks    Status  New    Target Date  07/13/17      PT SHORT TERM GOAL #2   Title  Patient to demonstrate lumbar ROM as being full in all planes in order to show improved mobility and flexibilty and to reduce pain     Time  4    Period  Weeks    Status  New      PT SHORT TERM GOAL #3   Title  Patient to demonstrate resolution of HS and piriformis muscle flexibility deficits in order to reduce pain and improve mechanics     Time  4    Period  Weeks    Status  New      PT SHORT TERM GOAL #4   Title  Patient to be independent in appropriate and progressive HEP, to be updated PRN     Time  2    Period  Weeks    Status  New    Target Date  06/29/17        PT Long Term Goals - 06/15/17 1252      PT LONG TERM GOAL #1   Title  Patient to demonstrate improvement in MMT by at least 1 grade in all tested groups and will be able to maintain quality TA activation during all activity to show improved functional strength     Time  8    Period  Weeks    Status  New    Target Date  08/10/17      PT LONG TERM GOAL #2   Title  Patient to be able to stand and walk for 60 minutes without increase in pain in order to show improvement in condition     Time  8    Period  Weeks    Status  New      PT LONG TERM GOAL #3   Title  Patient to be able to perform 5x sit to stand in 15 seconds or less and feet no wider apart than shoulders in order to show improved functional strength and mobility     Time  8    Period  Weeks    Status  New      PT LONG TERM GOAL #4   Title  Patient to show improved gait pattern to include reduced scissoring during gait, improved ankle stability, and equal stride lengths/stance times in order to show overall improvement in efficiency of  mobility     Time  8    Period  Weeks    Status  New             Plan - 06/15/17 1246    Clinical Impression Statement  Patient received requesting that for now PT focus on her back pain and functional strength, she is OK with pushing back care for her knee for now. Her back pain is chronic and she reports she did have some success with PT in the past and is interested in improving her functional strength and conditioning. Examination reveals impaired posture, severe functional muscle weakness, muscle stiffness and tenderness to palpation, gait deviation, and overall guarded movement pattern. She will benefit from skilled PT services to address functional limitations, reduce pain, and to return to PLOF.     History and Personal Factors relevant to plan of care:  chronic back pain, obesity, knee pain, hx vertigo, gross deconditioning     Clinical Presentation  Stable    Clinical Presentation due to:  deconditioning, compensation patterns, obesity, chroinc pain patterns     Rehab Potential  Good    Clinical Impairments Affecting Rehab Potential  (+) motivated to participate, success with PT in the past, (-) obesity, chronicity of pain, concurrent knee pain     PT Frequency  2x / week    PT Duration  8 weeks    PT Treatment/Interventions  ADLs/Self Care Home Management;Cryotherapy;Electrical Stimulation;Moist Heat;Traction;Ultrasound;Gait training;Stair training;Functional mobility training;Therapeutic activities;Therapeutic exercise;Balance training;Neuromuscular re-education;Patient/family education;Manual techniques;Dry needling    PT Next Visit Plan  check in on how HEP is going, update/adjust if needed; focus on hip/back mobility and core strength, progress to standing exercise as pain in standing improves     PT Home Exercise Plan  Eval: Frog stretch, HS stretch, piriformis stretch, lumbar rotations     Consulted and Agree with Plan of Care  Patient       Patient will benefit from  skilled therapeutic intervention in order to improve the following deficits and impairments:  Abnormal gait, Increased fascial restricitons, Improper body mechanics, Pain, Decreased coordination, Decreased mobility, Increased muscle spasms, Postural dysfunction, Decreased strength, Difficulty walking, Impaired flexibility  Visit Diagnosis: Chronic bilateral low back pain without sciatica - Plan: PT plan of care cert/re-cert  Muscle weakness (generalized) - Plan: PT plan of care cert/re-cert  Stiffness of left hip, not elsewhere classified - Plan: PT plan of care cert/re-cert  Stiffness of right hip, not elsewhere classified - Plan: PT plan of care cert/re-cert  Abnormal posture - Plan: PT plan of care cert/re-cert     Problem List Patient Active Problem List   Diagnosis Date Noted  . Cholelithiasis 06/21/2011    Deniece Ree PT, DPT, CBIS  Supplemental Physical Therapist [place]   Pager [facility] [address] [place], [place], [address] Phone: [phone]   Fax:  [phone]  Name: Wanda Mcintosh MRN: [id number] Date of Birth: 08-28-1965

## 2017-06-16 ENCOUNTER — Ambulatory Visit: Payer: Medicare HMO | Admitting: Physical Therapy

## 2017-06-21 ENCOUNTER — Ambulatory Visit: Payer: Medicare HMO | Admitting: Physical Therapy

## 2017-06-23 ENCOUNTER — Encounter: Payer: Self-pay | Admitting: Physical Therapy

## 2017-06-23 ENCOUNTER — Ambulatory Visit: Payer: Medicare HMO | Attending: Physical Medicine & Rehabilitation | Admitting: Physical Therapy

## 2017-06-23 DIAGNOSIS — M545 Low back pain: Secondary | ICD-10-CM | POA: Diagnosis not present

## 2017-06-23 DIAGNOSIS — G8929 Other chronic pain: Secondary | ICD-10-CM | POA: Insufficient documentation

## 2017-06-23 DIAGNOSIS — M25651 Stiffness of right hip, not elsewhere classified: Secondary | ICD-10-CM | POA: Insufficient documentation

## 2017-06-23 DIAGNOSIS — R293 Abnormal posture: Secondary | ICD-10-CM | POA: Diagnosis present

## 2017-06-23 DIAGNOSIS — M6281 Muscle weakness (generalized): Secondary | ICD-10-CM | POA: Diagnosis present

## 2017-06-23 DIAGNOSIS — M25652 Stiffness of left hip, not elsewhere classified: Secondary | ICD-10-CM | POA: Diagnosis present

## 2017-06-23 NOTE — Therapy (Signed)
[place], [place], [address] Phone: [phone]   Fax:  [phone]  Physical Therapy Treatment  Patient Details  Name: Wanda Mcintosh MRN: [id number] Date of Birth: [birth year] Referring Provider: Dr. Posey Pronto in May, orthopedic next week    Encounter Date: 06/23/2017  PT End of Session - 06/23/17 1233    Visit Number  2    Number of Visits  17    Date for PT Re-Evaluation  07/13/17    Authorization Type  [employer] medicare/medicaid     PT Start Time  1147    PT Stop Time  1229    PT Time Calculation (min)  42 min    Activity Tolerance  Patient tolerated treatment well    Behavior During Therapy  [facility] for tasks assessed/performed       Past Medical History:  Diagnosis Date  . Anemia   . Back injury   . Barrett's esophagus   . Cancer ([place])    cervica-l leep  . Chronic back pain   . Complication of anesthesia    possible aggression coming out of anesthesia  . Gallstones   . GERD (gastroesophageal reflux disease)   . Head ache   . Migraine   . Osteoarthritis   . Status post dilation of esophageal narrowing   . Vertigo     Past Surgical History:  Procedure Laterality Date  . BREAST SURGERY Bilateral    rt fibroid 90. lft 94  . CARPAL TUNNEL RELEASE Left   . CHOLECYSTECTOMY  07/16/2011   Procedure: LAPAROSCOPIC CHOLECYSTECTOMY WITH INTRAOPERATIVE CHOLANGIOGRAM;  Surgeon: Merrie Roof, MD;  Location: [facility];  Service: General;  Laterality: N/A;  . HAND SURGERY     cartilage  . LEEP    . TONSILLECTOMY      There were no vitals filed for this visit.  Subjective Assessment - 06/23/17 1149    Subjective  My doctor says I have arthritis in my knees, I still want to focus more on my back than my knees for now. I had some pain for a couple days after we did our evaluation. I am wondering about going to the gym as well.     Pertinent History  obesity, chronic back pain, vertigo, knee pain     Patient Stated Goals  get stronger in general, build muscles back up     Currently in Pain?  Yes    Pain Score  2     Pain Location  Back    Pain Orientation  Right;Left;Lower    Pain Descriptors / Indicators  Aching    Pain Type  Chronic pain    Pain Radiating Towards  none     Pain Onset  More than a month ago    Pain Frequency  Intermittent    Aggravating Factors   starting and stopping during walking, standing     Pain Relieving Factors  heat     Effect of Pain on Daily Activities  moderate                        [facility] Adult PT Treatment/Exercise - 06/23/17 0001      Exercises   Exercises  Knee/Hip;Lumbar      Lumbar Exercises: Seated   Other Seated Lumbar Exercises  on turquoise air pad- seated marches no UEs 1x10 ; LAQ with overhead press 2# ; close rotations with 10# kettlebell 1x10  Lumbar Exercises: Supine   Ab Set  10 reps;3 seconds    Clam  10 reps    Clam Limitations  green band     Bent Knee Raise  10 reps    Bent Knee Raise Limitations  cores set     Dead Bug  10 reps    Bridge  10 reps    Straight Leg Raise  10 reps    Straight Leg Raises Limitations  eccentric lower   2#      Knee/Hip Exercises: Standing   Other Standing Knee Exercises  kettle bell squats 1x10; lateral trunk crunches with 10# kettle bell 1x10 B       Knee/Hip Exercises: Supine   Bridges  Both;1 set;10 reps      Manual Therapy   Manual Therapy  Soft tissue mobilization    Manual therapy comments  separate from all other skilled services     Soft tissue mobilization  L hip flexor, ball assisted              PT Education - 06/23/17 1233    Education provided  Yes    Education Details  benefits of soft tissue work to L hip flexor, plan to update HEP next session, cues for form during exercises     Person(s) Educated  Patient    Methods  Explanation    Comprehension  Verbalized understanding       PT Short Term Goals - 06/15/17 1249      PT SHORT TERM GOAL  #1   Title  Patient to be able to maintain upright posture without external cues at least 75% of the time in order to reduce pain and improve functional mechanics     Time  4    Period  Weeks    Status  New    Target Date  07/13/17      PT SHORT TERM GOAL #2   Title  Patient to demonstrate lumbar ROM as being full in all planes in order to show improved mobility and flexibilty and to reduce pain     Time  4    Period  Weeks    Status  New      PT SHORT TERM GOAL #3   Title  Patient to demonstrate resolution of HS and piriformis muscle flexibility deficits in order to reduce pain and improve mechanics     Time  4    Period  Weeks    Status  New      PT SHORT TERM GOAL #4   Title  Patient to be independent in appropriate and progressive HEP, to be updated PRN     Time  2    Period  Weeks    Status  New    Target Date  06/29/17        PT Long Term Goals - 06/15/17 1252      PT LONG TERM GOAL #1   Title  Patient to demonstrate improvement in MMT by at least 1 grade in all tested groups and will be able to maintain quality TA activation during all activity to show improved functional strength     Time  8    Period  Weeks    Status  New    Target Date  08/10/17      PT LONG TERM GOAL #2   Title  Patient to be able to stand and walk for 60 minutes without increase in pain in order to  show improvement in condition     Time  8    Period  Weeks    Status  New      PT LONG TERM GOAL #3   Title  Patient to be able to perform 5x sit to stand in 15 seconds or less and feet no wider apart than shoulders in order to show improved functional strength and mobility     Time  8    Period  Weeks    Status  New      PT LONG TERM GOAL #4   Title  Patient to show improved gait pattern to include reduced scissoring during gait, improved ankle stability, and equal stride lengths/stance times in order to show overall improvement in efficiency of mobility     Time  8    Period  Weeks     Status  New            Plan - 06/23/17 1233    Clinical Impression Statement  Patient arrives reporting her MD has told her she has arthritis in her knees, she would still like to focus more on her back than her knees for now however. Discussed potential options for exercise at the gym to include water exercise, then proceeded with focus on core strength and proximal strengthening as tolerated  this session. Patient remains very motivated to continue with skilled PT treatments and progress forward in terms of gym based activity. Performed trigger point release to L hip flexor today with significant decrease in pain this session.     Rehab Potential  Good    Clinical Impairments Affecting Rehab Potential  (+) motivated to participate, success with PT in the past, (-) obesity, chronicity of pain, concurrent knee pain     PT Frequency  2x / week    PT Duration  8 weeks    PT Treatment/Interventions  ADLs/Self Care Home Management;Cryotherapy;Electrical Stimulation;Moist Heat;Traction;Ultrasound;Gait training;Stair training;Functional mobility training;Therapeutic activities;Therapeutic exercise;Balance training;Neuromuscular re-education;Patient/family education;Manual techniques;Dry needling    PT Next Visit Plan  update HEP; continue focus on strength and stability, progress to standing exercises. Continue STM     PT Home Exercise Plan  Eval: Frog stretch, HS stretch, piriformis stretch, lumbar rotations     Consulted and Agree with Plan of Care  Patient       Patient will benefit from skilled therapeutic intervention in order to improve the following deficits and impairments:  Abnormal gait, Increased fascial restricitons, Improper body mechanics, Pain, Decreased coordination, Decreased mobility, Increased muscle spasms, Postural dysfunction, Decreased strength, Difficulty walking, Impaired flexibility  Visit Diagnosis: Chronic bilateral low back pain without sciatica  Muscle weakness  (generalized)  Stiffness of left hip, not elsewhere classified  Stiffness of right hip, not elsewhere classified  Abnormal posture     Problem List Patient Active Problem List   Diagnosis Date Noted  . Cholelithiasis 06/21/2011    Deniece Ree PT, DPT, CBIS  Supplemental Physical Therapist [place]   Pager [place] [facility] [address] [place], [place], [address] Phone: [phone]   Fax:  [phone]  Name: Wanda Mcintosh MRN: [id number] Date of Birth: 1965/07/12

## 2017-06-28 ENCOUNTER — Encounter: Payer: Self-pay | Admitting: Physical Therapy

## 2017-06-28 ENCOUNTER — Ambulatory Visit: Payer: Medicare HMO | Admitting: Physical Therapy

## 2017-06-28 DIAGNOSIS — G8929 Other chronic pain: Secondary | ICD-10-CM

## 2017-06-28 DIAGNOSIS — M545 Low back pain: Secondary | ICD-10-CM | POA: Diagnosis not present

## 2017-06-28 DIAGNOSIS — M6281 Muscle weakness (generalized): Secondary | ICD-10-CM

## 2017-06-28 DIAGNOSIS — M25652 Stiffness of left hip, not elsewhere classified: Secondary | ICD-10-CM

## 2017-06-28 DIAGNOSIS — R293 Abnormal posture: Secondary | ICD-10-CM

## 2017-06-28 DIAGNOSIS — M25651 Stiffness of right hip, not elsewhere classified: Secondary | ICD-10-CM

## 2017-06-28 NOTE — Therapy (Signed)
[place], [place], [address] Phone: [phone]   Fax:  [phone]  Physical Therapy Treatment  Patient Details  Name: Wanda Mcintosh MRN: [id number] Date of Birth: 20-Sep-1965 Referring Provider: Dr. Posey Pronto in May, orthopedic next week    Encounter Date: 06/28/2017  PT End of Session - 06/28/17 1230    Visit Number  3    Number of Visits  17    Date for PT Re-Evaluation  07/13/17    Authorization Type  [employer] medicare/medicaid     PT Start Time  1148    PT Stop Time  1230    PT Time Calculation (min)  42 min    Activity Tolerance  Patient tolerated treatment well    Behavior During Therapy  [facility] for tasks assessed/performed       Past Medical History:  Diagnosis Date  . Anemia   . Back injury   . Barrett's esophagus   . 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 Chronic back pain   . Complication of anesthesia    possible aggression coming out of anesthesia  . Gallstones   . GERD (gastroesophageal reflux disease)   . Head ache   . Migraine   . Osteoarthritis   . Status post dilation of esophageal narrowing   . Vertigo     Past Surgical History:  Procedure Laterality Date  . BREAST SURGERY Bilateral    rt fibroid 90. lft 94  . CARPAL TUNNEL RELEASE Left   . CHOLECYSTECTOMY  07/16/2011   Procedure: LAPAROSCOPIC CHOLECYSTECTOMY WITH INTRAOPERATIVE CHOLANGIOGRAM;  Surgeon: Merrie Roof, MD;  Location: [facility];  Service: General;  Laterality: N/A;  . HAND SURGERY     cartilage  . LEEP    . TONSILLECTOMY      There were no vitals filed for this visit.  Subjective Assessment - 06/28/17 1214    Subjective  I am feeling better, the massage we did to my hip last time really seemed to help     Patient Stated Goals  get stronger in general, build muscles back up     Currently in Pain?  Yes    Pain Score  5     Pain Location  Other (Comment) generalized pain                         [facility] Adult PT Treatment/Exercise - 06/28/17 0001      Lumbar Exercises: Standing   Other Standing Lumbar Exercises  functional squats with 10# kettlebell 1x10       Lumbar Exercises: Seated   Other Seated Lumbar Exercises  on turquiose pad, seated marches with 2#, rotations with 10# kettlebell       Lumbar Exercises: Supine   Ab Set  15 reps    Bent Knee Raise  15 reps    Bent Knee Raise Limitations  core set green band     Bridge with clamshell  15 reps green band     Bridge with March  15 reps green band       Knee/Hip Exercises: Stretches   Other Knee/Hip Stretches  hip flexor stretches 2x30       Manual Therapy   Manual Therapy  Soft tissue mobilization    Manual therapy comments  separate from all other skilled services     Soft tissue mobilization  B hip flexors, ball assisted  PT Education - 06/28/17 1229    Education provided  Yes    Education Details  HEp updates, hip flexor massage at home     Person(s) Educated  Patient    Methods  Explanation    Comprehension  Verbalized understanding;Need further instruction;Returned demonstration       PT Short Term Goals - 06/15/17 1249      PT SHORT TERM GOAL #1   Title  Patient to be able to maintain upright posture without external cues at least 75% of the time in order to reduce pain and improve functional mechanics     Time  4    Period  Weeks    Status  New    Target Date  07/13/17      PT SHORT TERM GOAL #2   Title  Patient to demonstrate lumbar ROM as being full in all planes in order to show improved mobility and flexibilty and to reduce pain     Time  4    Period  Weeks    Status  New      PT SHORT TERM GOAL #3   Title  Patient to demonstrate resolution of HS and piriformis muscle flexibility deficits in order to reduce pain and improve mechanics     Time  4    Period  Weeks    Status  New      PT SHORT TERM GOAL #4   Title  Patient to be  independent in appropriate and progressive HEP, to be updated PRN     Time  2    Period  Weeks    Status  New    Target Date  06/29/17        PT Long Term Goals - 06/15/17 1252      PT LONG TERM GOAL #1   Title  Patient to demonstrate improvement in MMT by at least 1 grade in all tested groups and will be able to maintain quality TA activation during all activity to show improved functional strength     Time  8    Period  Weeks    Status  New    Target Date  08/10/17      PT LONG TERM GOAL #2   Title  Patient to be able to stand and walk for 60 minutes without increase in pain in order to show improvement in condition     Time  8    Period  Weeks    Status  New      PT LONG TERM GOAL #3   Title  Patient to be able to perform 5x sit to stand in 15 seconds or less and feet no wider apart than shoulders in order to show improved functional strength and mobility     Time  8    Period  Weeks    Status  New      PT LONG TERM GOAL #4   Title  Patient to show improved gait pattern to include reduced scissoring during gait, improved ankle stability, and equal stride lengths/stance times in order to show overall improvement in efficiency of mobility     Time  8    Period  Weeks    Status  New            Plan - 06/28/17 1256    Clinical Impression Statement  Patient reports ongoing relief from hip flexor manual performed last session, but felt good otherwise; continued with progression of functional  core strengthening and manual today, updated HEP with hip flexor stretches as well as self-massage with tennis ball. She reports she is going to the doctor on Thursday and plans to report our success with PT thus far.     Rehab Potential  Good    Clinical Impairments Affecting Rehab Potential  (+) motivated to participate, success with PT in the past, (-) obesity, chronicity of pain, concurrent knee pain     PT Duration  8 weeks    PT Treatment/Interventions  ADLs/Self Care Home  Management;Cryotherapy;Electrical Stimulation;Moist Heat;Traction;Ultrasound;Gait training;Stair training;Functional mobility training;Therapeutic activities;Therapeutic exercise;Balance training;Neuromuscular re-education;Patient/family education;Manual techniques;Dry needling    PT Next Visit Plan  coninue manual to hip flexors, focus on strength and stability, advance standing exercises     PT Home Exercise Plan  Eval: Frog stretch, HS stretch, piriformis stretch, lumbar rotations, hip flexor stretch, self massage to hip flexors        Patient will benefit from skilled therapeutic intervention in order to improve the following deficits and impairments:  Abnormal gait, Increased fascial restricitons, Improper body mechanics, Pain, Decreased coordination, Decreased mobility, Increased muscle spasms, Postural dysfunction, Decreased strength, Difficulty walking, Impaired flexibility  Visit Diagnosis: Chronic bilateral low back pain without sciatica  Muscle weakness (generalized)  Stiffness of left hip, not elsewhere classified  Stiffness of right hip, not elsewhere classified  Abnormal posture     Problem List Patient Active Problem List   Diagnosis Date Noted  . Cholelithiasis 06/21/2011    Deniece Ree PT, DPT, CBIS  Supplemental Physical Therapist [place]   Pager [facility] [address] [place], [place], [address] Phone: [phone]   Fax:  [phone]  Name: Wanda Mcintosh MRN: [id number] Date of Birth: 06-26-1965

## 2017-06-28 NOTE — Patient Instructions (Signed)
   HIP FLEXOR STRETCH  While lying on a table or high bed, let the affected leg lower towards the floor until a stretch is felt along the front of your thigh.  Hold for at least 30 seconds, then relax.  Repeat 3 times each side, twice a day.

## 2017-06-30 ENCOUNTER — Ambulatory Visit (INDEPENDENT_AMBULATORY_CARE_PROVIDER_SITE_OTHER): Payer: Medicare HMO | Admitting: Gastroenterology

## 2017-06-30 ENCOUNTER — Other Ambulatory Visit (INDEPENDENT_AMBULATORY_CARE_PROVIDER_SITE_OTHER): Payer: Medicare HMO

## 2017-06-30 ENCOUNTER — Encounter: Payer: Self-pay | Admitting: Gastroenterology

## 2017-06-30 VITALS — BP 124/86 | HR 80 | Ht 67.13 in | Wt 265.4 lb

## 2017-06-30 DIAGNOSIS — R1013 Epigastric pain: Secondary | ICD-10-CM | POA: Diagnosis not present

## 2017-06-30 DIAGNOSIS — K760 Fatty (change of) liver, not elsewhere classified: Secondary | ICD-10-CM

## 2017-06-30 DIAGNOSIS — K219 Gastro-esophageal reflux disease without esophagitis: Secondary | ICD-10-CM

## 2017-06-30 LAB — HEPATIC FUNCTION PANEL
ALT: 40 U/L — ABNORMAL HIGH (ref 0–35)
AST: 25 U/L (ref 0–37)
Albumin: 4.1 g/dL (ref 3.5–5.2)
Alkaline Phosphatase: 74 U/L (ref 39–117)
Bilirubin, Direct: 0.1 mg/dL (ref 0.0–0.3)
Total Bilirubin: 0.4 mg/dL (ref 0.2–1.2)
Total Protein: 6.9 g/dL (ref 6.0–8.3)

## 2017-06-30 NOTE — Patient Instructions (Addendum)
If you are age 51 or older, your body mass index should be between 23-30. Your Body mass index is 41.4 kg/m. If this is out of the aforementioned range listed, please consider follow up with your Primary Care Provider.  If you are age 61 or younger, your body mass index should be between 19-25. Your Body mass index is 41.4 kg/m. If this is out of the aformentioned range listed, please consider follow up with your Primary Care Provider.   You have been scheduled for an endoscopy. Please follow written instructions given to you at your visit today. If you use inhalers (even only as needed), please bring them with you on the day of your procedure. Your physician has requested that you go to www.startemmi.com and enter the access code given to you at your visit today. This web site gives a general overview about your procedure. However, you should still follow specific instructions given to you by our office regarding your preparation for the procedure.  Please go to the lab in the basement of our building to have lab work done as you leave today.  Thank you for entrusting me with your care and for choosing [facility], Dr. Alicia Cellar

## 2017-06-30 NOTE — Progress Notes (Signed)
HPI :  52 year old female here for follow-up visit for reflux, epigastric pain and LFT elevation.  Reportedly had Barrett's diagnosed in 2009, no record of that EGD available. Last EGD in 2015 which did not show any evidence of Barrett's. She reports she has been on omeprazole 20 mg once a day since her last visit. She states this typically controls her reflux symptoms, which she thinks are often manifested by swallowing difficulty. She has occasional dysphagia but not much recently. She denies any pyrosis that bothers her or regurgitation. She does however endorse nausea along with epigastric burning at times which bothers her. This been going on for 6 months which comes and goes. She denies any postprandial association. Her symptoms usually bother her when she is standing up for periods of time. No vomiting. No nocturnal symptoms. No FH of esophageal cancer. No FH of colon cancer. No tobacco use.   She otherwise endorses having problems with constipation which resulted in fecal impaction at the end of December. She is using an herbal laxative since that time which she states works well for her. She is having a bowel movement about 2 times per day. She denies any blood in her stool.  Otherwise she has been noted to have elevated liver enzymes on her last few lab draws. ALT range from 101 to 136, AST 54-98, normal bilirubin and AP (labs 04/21/17 and 05/16/17). Prior LFTs to this in recent years were normal.   Negative HCV AB, negative hep B surface AG on 05/16/17, (+) hep B surface AB, negative hep A CBC normal  She denies any alcohol use at all. She thinks this is related to diclofenac which he was taking at the end of 2018 until the time of her liver enzyme elevation. She was referred for an ultrasound of her liver which showed fatty liver disease. She denies any routine alcohol use, drinks about 1-2 alcoholic beverages per year. She denies any weight changes over the past year. She has remotely had  iron studies which showed deficiency in the past.  US liver 06/07/17 - fatty infiltration of the liver, s/p cholecystectomy  Endoscopic history: EGD 02/07/2014 - normal exam, biopsies taken of the GEJ which showed no evidence of Barrett's esophagus Colonoscopy 02/07/2014 - diverticulosis, otherwise normal colon. Biopsies show no evidence of microscopic colitis.     Past Medical History:  Diagnosis Date  . Anemia   . Back injury   . Barrett's esophagus   . 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 Chronic back pain   . Complication of anesthesia    possible aggression coming out of anesthesia  . Gallstones   . GERD (gastroesophageal reflux disease)   . Head ache   . Migraine   . Osteoarthritis   . Status post dilation of esophageal narrowing   . Vertigo      Past Surgical History:  Procedure Laterality Date  . BREAST SURGERY Bilateral    rt fibroid 90. lft 94  . CARPAL TUNNEL RELEASE Left   . CARPAL TUNNEL RELEASE Right 12/2016  . CHOLECYSTECTOMY  07/16/2011   Procedure: LAPAROSCOPIC CHOLECYSTECTOMY WITH INTRAOPERATIVE CHOLANGIOGRAM;  Surgeon: Merrie Roof, MD;  Location: [facility];  Service: General;  Laterality: N/A;  . HAND SURGERY     cartilage  . LEEP    . TONSILLECTOMY     Family History  Problem Relation Age of Onset  . Hypertension Mother   . Breast cancer Mother   . Colon polyps  Father   . Hypertension Father   . Diabetes Maternal Grandmother   . Diabetes Paternal Grandmother   . Prostate cancer Paternal Grandfather   . Diabetes Maternal Aunt   . Irritable bowel syndrome Son   . Colon polyps Paternal Uncle   . Prostate cancer Paternal Uncle   . Colon polyps Paternal Uncle    Social History   Tobacco Use  . Smoking status: Never Smoker  . Smokeless tobacco: Never Used  . Tobacco comment: smoked in 7yh grade, occ alcohol  Substance Use Topics  . Alcohol use: Yes    Comment: occasional  . Drug use: No   Current Outpatient Medications  Medication Sig  Dispense Refill  . aspirin-acetaminophen-caffeine (EXCEDRIN MIGRAINE) 250-250-65 MG tablet Take by mouth every 6 (six) hours as needed for headache.    . b complex vitamins tablet Take 1 tablet by mouth daily.    . diclofenac sodium (VOLTAREN) 1 % GEL Apply 2 g topically 4 (four) times daily. 1 Tube 1  . DULoxetine (CYMBALTA) 60 MG capsule TAKE 1 CAPSULE EVERY DAY 90 capsule 1  . estradiol (VIVELLE-DOT) 0.1 MG/24HR patch Place 1 patch onto the skin 2 (two) times a week.  12  . Lidocaine (LIDODERM EX) Apply 1 application topically as needed.    . magnesium oxide (MAG-OX) 400 MG tablet Take 400 mg by mouth daily.    . meclizine (ANTIVERT) 25 MG tablet Take 1 tablet by mouth as needed.    Marland Kitchen omeprazole (PRILOSEC) 20 MG capsule Take 20 mg by mouth daily.    . potassium chloride (K-DUR,KLOR-CON) 10 MEQ tablet Take 10 mEq by mouth 2 (two) times daily.    . progesterone (PROMETRIUM) 100 MG capsule Take 100 mg by mouth daily.    Marland Kitchen rOPINIRole (REQUIP) 0.25 MG tablet Take 1 tablet by mouth as needed.    . SUMAtriptan (IMITREX) 100 MG tablet Take 100 mg by mouth daily as needed for migraine. May repeat in 2 hours if headache persists or recurs.    Marland Kitchen tiZANidine (ZANAFLEX) 4 MG tablet TAKE 1/2 TABLET TWO TIMES DAILY AS NEEDED FOR MUSCLE SPASMS 30 tablet 2   No current facility-administered medications for this visit.    Allergies  Allergen Reactions  . Baclofen Other (See Comments) and Swelling    Lower limb swelling  . Celebrex [Celecoxib] Other (See Comments)    Excessive bloating GI pain Paleness Excessive sleeping Mood change (aggression)  . Diclofenac Sodium Other (See Comments)    elevated liver enzymes  . Flexeril [Cyclobenzaprine] Other (See Comments)    Other reaction(s): Headache Headache  Headache   . Gabapentin Swelling    Tears up stomach Feet swell  . Nsaids     Mobic tears up stomach.  . Ondansetron Hcl Other (See Comments)    Other reaction(s): Headache migraines  . Zofran  Other (See Comments)    migraines     Review of Systems: All systems reviewed and negative except where noted in HPI.    US Abdomen Limited Ruq  Result Date: 06/07/2017 CLINICAL DATA:  Elevated liver function tests. History of cholecystectomy. EXAM: ULTRASOUND ABDOMEN LIMITED RIGHT UPPER QUADRANT COMPARISON:  None. FINDINGS: Gallbladder: Removed. Common bile duct: Diameter: 0.6 cm Liver: Echotexture is heterogeneous and echogenicity is somewhat increased. No focal lesion is identified. Portal vein is patent on color Doppler imaging with normal direction of blood flow towards the liver. IMPRESSION: Fatty infiltration of the liver without focal lesion or biliary ductal dilatation. Status post  cholecystectomy. Electronically Signed   By: Inge Rise M.D.   On: 06/07/2017 09:44   Labs as above  Physical Exam: BP 124/86 (BP Location: Left Arm, Patient Position: Sitting, Cuff Size: Normal)   Pulse 80   Ht 5' 7.13" (1.705 m)   Wt 265 lb 6 oz (120.4 kg)   BMI 41.40 kg/m  Constitutional: Pleasant,well-developed, female in no acute distress. HEENT: Normocephalic and atraumatic. Conjunctivae are normal. No scleral icterus. Neck supple.  Cardiovascular: Normal rate, regular rhythm.  Pulmonary/chest: Effort normal and breath sounds normal. No wheezing, rales or rhonchi. Abdominal: Soft, nondistended, mild epigastric TTP.  There are no masses palpable.  Extremities: no edema Lymphadenopathy: No cervical adenopathy noted. Neurological: Alert and oriented to person place and time. Skin: Skin is warm and dry. No rashes noted. Psychiatric: Normal mood and affect. Behavior is normal.   ASSESSMENT AND PLAN: 52 year old female with history as outlined above, here for reassessment following issues:  GERD / epigastric pain - reflux symptoms seem relatively well controlled on low-dose omeprazole. At her last visit we discussed her endoscopic history, she raised some concerns about her history of  prior reported Barrett's and we had discussed a follow-up endoscopy at some point in time to ensure she did not have this. In light of her intermittent epigastric discomfort we discussed upper endoscopy to further evaluate that and ensure no evidence of Barrett's. I discussed differential for her symptoms currently which include reflux esophagitis, H. Pylori, PUD (I think unlikely), versus musculoskeletal. I discussed risks and benefits of endoscopy and anesthesia with her and she wanted to proceed. Further recommend patient pending the result.  Elevated ALT / fatty liver - she feels time course of LFT elevation corresponds to her use of diclofenac. This is possible. We'll repeat LFTs now that she is off NSAIDs to see if they have normalized. Otherwise if elevation persists I suspect this is related to fatty liver noted on imaging. Given this elevation was noted on multiple labs will screen her for autoimmune hepatitis ensure negative. She is otherwise test negative for viral hepatitis and iron studies have been okay in the past. Otherwise discussed spectrum of fatty liver disease with her with long term associated risks of Karlene Lineman and/or cirrhosis. She is working on weight loss in this light.  [place] Cellar, MD [facility]

## 2017-07-01 ENCOUNTER — Ambulatory Visit: Payer: Medicare HMO | Admitting: Physical Therapy

## 2017-07-01 ENCOUNTER — Encounter: Payer: Self-pay | Admitting: Gastroenterology

## 2017-07-01 ENCOUNTER — Ambulatory Visit (AMBULATORY_SURGERY_CENTER): Payer: Medicare HMO | Admitting: Gastroenterology

## 2017-07-01 ENCOUNTER — Other Ambulatory Visit: Payer: Self-pay

## 2017-07-01 VITALS — BP 107/80 | HR 65 | Temp 98.6°F | Resp 10 | Ht 67.0 in | Wt 265.0 lb

## 2017-07-01 DIAGNOSIS — K319 Disease of stomach and duodenum, unspecified: Secondary | ICD-10-CM | POA: Diagnosis not present

## 2017-07-01 DIAGNOSIS — K219 Gastro-esophageal reflux disease without esophagitis: Secondary | ICD-10-CM | POA: Diagnosis present

## 2017-07-01 DIAGNOSIS — R1013 Epigastric pain: Secondary | ICD-10-CM

## 2017-07-01 MED ORDER — SODIUM CHLORIDE 0.9 % IV SOLN: 500.0000 mL | Freq: Once | INTRAVENOUS | Status: DC

## 2017-07-01 NOTE — Patient Instructions (Signed)
YOU HAD AN ENDOSCOPIC PROCEDURE TODAY AT [facility]:   Refer to the procedure report that was given to you for any specific questions about what was found during the examination.  If the procedure report does not answer your questions, please call your gastroenterologist to clarify.  If you requested that your care partner not be given the details of your procedure findings, then the procedure report has been included in a sealed envelope for you to review at your convenience later.  YOU SHOULD EXPECT: Some feelings of bloating in the abdomen. Passage of more gas than usual.  Walking can help get rid of the air that was put into your GI tract during the procedure and reduce the bloating. If you had a lower endoscopy (such as a colonoscopy or flexible sigmoidoscopy) you may notice spotting of blood in your stool or on the toilet paper. If you underwent a bowel prep for your procedure, you may not have a normal bowel movement for a few days.  Please Note:  You might notice some irritation and congestion in your nose or some drainage.  This is from the oxygen used during your procedure.  There is no need for concern and it should clear up in a day or so.  SYMPTOMS TO REPORT IMMEDIATELY:    Following upper endoscopy (EGD)  Vomiting of blood or coffee ground material  New chest pain or pain under the shoulder blades  Painful or persistently difficult swallowing  New shortness of breath  Fever of 100F or higher  Black, tarry-looking stools  For urgent or emergent issues, a gastroenterologist can be reached at any hour by calling [phone].   DIET:  We do recommend a small meal at first, but then you may proceed to your regular diet.  Drink plenty of fluids but you should avoid alcoholic beverages for 24 hours.  ACTIVITY:  You should plan to take it easy for the rest of today and you should NOT DRIVE or use heavy machinery until tomorrow (because of the sedation medicines used  during the test).    FOLLOW UP: Our staff will call the number listed on your records the next business day following your procedure to check on you and address any questions or concerns that you may have regarding the information given to you following your procedure. If we do not reach you, we will leave a message.  However, if you are feeling well and you are not experiencing any problems, there is no need to return our call.  We will assume that you have returned to your regular daily activities without incident.  If any biopsies were taken you will be contacted by phone or by letter within the next 1-3 weeks.  Please call us at [phone] if you have not heard about the biopsies in 3 weeks.    SIGNATURES/CONFIDENTIALITY: You and/or your care partner have signed paperwork which will be entered into your electronic medical record.  These signatures attest to the fact that that the information above on your After Visit Summary has been reviewed and is understood.  Full responsibility of the confidentiality of this discharge information lies with you and/or your care-partner.  Gastritis information given.,  Increase omeprazole to twice daily for 4 weeks to treat gastritis

## 2017-07-01 NOTE — Progress Notes (Signed)
Called to room to assist during endoscopic procedure.  Patient ID and intended procedure confirmed with present staff. Received instructions for my participation in the procedure from the performing physician.  

## 2017-07-01 NOTE — Op Note (Signed)
[facility] Patient Name: Wanda Mcintosh Procedure Date: 07/01/2017 3:13 PM MRN: [id number] Endoscopist: Remo Lipps P. Aby Gessel MD, MD Age: 52 Referring MD:  Date of Birth: Dec 11, 1965 Gender: Female Account #: [id number] Procedure:                Upper GI endoscopy Indications:              Intermittent epigastric abdominal pain, history of                            reflux well controlled on omeprazole - remote prior                            endoscopy of Barrett's esophagus, last EGD in 2015                            showed no evidence of Barrett's esophagus Medicines:                Monitored Anesthesia Care Procedure:                Pre-Anesthesia Assessment:                           - Prior to the procedure, a History and Physical                            was performed, and patient medications and                            allergies were reviewed. The patient's tolerance of                            previous anesthesia was also reviewed. The risks                            and benefits of the procedure and the sedation                            options and risks were discussed with the patient.                            All questions were answered, and informed consent                            was obtained. Prior Anticoagulants: The patient has                            taken no previous anticoagulant or antiplatelet                            agents. ASA Grade Assessment: II - A patient with                            mild systemic disease. After reviewing the risks  and benefits, the patient was deemed in                            satisfactory condition to undergo the procedure.                           After obtaining informed consent, the endoscope was                            passed under direct vision. Throughout the                            procedure, the patient's blood pressure, pulse, and   oxygen saturations were monitored continuously. The                            Model GIF-HQ190 [id number]) scope was introduced                            through the mouth, and advanced to the second part                            of duodenum. The upper GI endoscopy was                            accomplished without difficulty. The patient                            tolerated the procedure well. Scope In: Scope Out: Findings:                 Esophagogastric landmarks were identified: the                            Z-line was found at 35 cm, the gastroesophageal                            junction was found at 35 cm and the upper extent of                            the gastric folds was found at 35 cm from the                            incisors.                           The exam of the esophagus was otherwise normal. No                            evidence of Barrett's esophagus.                           Patchy mild inflammation characterized by erosions  and erythema was found in the gastric antrum.                            Biopsies were taken from the antrum, body, and                            incisura with a cold forceps for Helicobacter                            pylori testing.                           The exam of the stomach was otherwise normal.                           The duodenal bulb and second portion of the                            duodenum were normal. Complications:            No immediate complications. Estimated blood loss:                            Minimal. Estimated Blood Loss:     Estimated blood loss was minimal. Impression:               - Esophagogastric landmarks identified.                           - Normal esophagus - no evidence of Barrett's                            esophagus                           - Mild gastritis. Biopsied.                           - Normal stomach otherwise.                           - Normal  duodenal bulb and second portion of the                            duodenum. Recommendation:           - Patient has a contact number available for                            emergencies. The signs and symptoms of potential                            delayed complications were discussed with the                            patient. Return to normal activities tomorrow.  Written discharge instructions were provided to the                            patient.                           - Resume previous diet.                           - Continue present medications.                           - Minimize NSAID use                           - Trial of increasing omeprazole to twice daily for                            4 weeks to treat gastritis and see if this helps                            epigastric symptoms                           - Await pathology results. Remo Lipps P. Deondria Puryear MD, MD 07/01/2017 3:31:52 PM This report has been signed electronically.

## 2017-07-01 NOTE — Progress Notes (Signed)
To recovery, report to RN, VSS. 

## 2017-07-04 ENCOUNTER — Telehealth: Payer: Self-pay | Admitting: *Deleted

## 2017-07-04 NOTE — Telephone Encounter (Signed)
  Follow up Call-  Call back number 07/01/2017  Post procedure Call Back phone  # [phone]  Permission to leave phone message Yes  Some recent data might be hidden     Patient questions:  Do you have a fever, pain , or abdominal swelling? No. Pain Score  0 *  Have you tolerated food without any problems? Yes.    Have you been able to return to your normal activities? Yes.    Do you have any questions about your discharge instructions: Diet   No. Medications  No. Follow up visit  No.  Do you have questions or concerns about your Care? No.  Actions: * If pain score is 4 or above: No action needed, pain <4.

## 2017-07-05 ENCOUNTER — Other Ambulatory Visit: Payer: Self-pay | Admitting: Physical Medicine & Rehabilitation

## 2017-07-05 ENCOUNTER — Ambulatory Visit: Payer: Medicare HMO | Admitting: Physical Therapy

## 2017-07-05 ENCOUNTER — Telehealth: Payer: Self-pay | Admitting: Physical Therapy

## 2017-07-05 NOTE — Telephone Encounter (Signed)
No-show for this appointment. Called and LMOM.  Deniece Ree PT, DPT, CBIS  Supplemental Physical Therapist [facility]   Pager [phone]

## 2017-07-06 ENCOUNTER — Other Ambulatory Visit: Payer: Self-pay

## 2017-07-06 LAB — ANTI-SMOOTH MUSCLE ANTIBODY, IGG: Actin (Smooth Muscle) Antibody (IGG): <20 U (ref <20)

## 2017-07-06 LAB — ANA: Anti Nuclear Antibody(ANA): NEGATIVE

## 2017-07-06 LAB — IGG: IGG (IMMUNOGLOBIN G), SERUM: 836 mg/dL (ref 694–1618)

## 2017-07-07 ENCOUNTER — Ambulatory Visit: Payer: Medicare HMO | Admitting: Physical Therapy

## 2017-07-07 ENCOUNTER — Encounter: Payer: Self-pay | Admitting: Physical Medicine & Rehabilitation

## 2017-07-07 ENCOUNTER — Encounter: Payer: Medicare HMO | Attending: Physical Medicine & Rehabilitation | Admitting: Physical Medicine & Rehabilitation

## 2017-07-07 ENCOUNTER — Encounter: Payer: Self-pay | Admitting: Physical Therapy

## 2017-07-07 ENCOUNTER — Other Ambulatory Visit: Payer: Self-pay

## 2017-07-07 VITALS — BP 113/82 | HR 87 | Ht 68.0 in | Wt 264.4 lb

## 2017-07-07 DIAGNOSIS — G43909 Migraine, unspecified, not intractable, without status migrainosus: Secondary | ICD-10-CM | POA: Insufficient documentation

## 2017-07-07 DIAGNOSIS — M791 Myalgia, unspecified site: Secondary | ICD-10-CM | POA: Diagnosis not present

## 2017-07-07 DIAGNOSIS — G8929 Other chronic pain: Secondary | ICD-10-CM | POA: Diagnosis not present

## 2017-07-07 DIAGNOSIS — R42 Dizziness and giddiness: Secondary | ICD-10-CM | POA: Diagnosis not present

## 2017-07-07 DIAGNOSIS — M461 Sacroiliitis, not elsewhere classified: Secondary | ICD-10-CM | POA: Diagnosis not present

## 2017-07-07 DIAGNOSIS — M6281 Muscle weakness (generalized): Secondary | ICD-10-CM

## 2017-07-07 DIAGNOSIS — M792 Neuralgia and neuritis, unspecified: Secondary | ICD-10-CM | POA: Diagnosis not present

## 2017-07-07 DIAGNOSIS — M25562 Pain in left knee: Secondary | ICD-10-CM | POA: Diagnosis not present

## 2017-07-07 DIAGNOSIS — G479 Sleep disorder, unspecified: Secondary | ICD-10-CM | POA: Insufficient documentation

## 2017-07-07 DIAGNOSIS — M25652 Stiffness of left hip, not elsewhere classified: Secondary | ICD-10-CM

## 2017-07-07 DIAGNOSIS — M25651 Stiffness of right hip, not elsewhere classified: Secondary | ICD-10-CM

## 2017-07-07 DIAGNOSIS — M545 Low back pain: Secondary | ICD-10-CM | POA: Insufficient documentation

## 2017-07-07 DIAGNOSIS — G894 Chronic pain syndrome: Secondary | ICD-10-CM | POA: Diagnosis not present

## 2017-07-07 DIAGNOSIS — R293 Abnormal posture: Secondary | ICD-10-CM

## 2017-07-07 DIAGNOSIS — G629 Polyneuropathy, unspecified: Secondary | ICD-10-CM | POA: Diagnosis not present

## 2017-07-07 DIAGNOSIS — M533 Sacrococcygeal disorders, not elsewhere classified: Secondary | ICD-10-CM | POA: Insufficient documentation

## 2017-07-07 NOTE — Therapy (Signed)
[place], [place], [address] Phone: [phone]   Fax:  [phone]  Physical Therapy Treatment  Patient Details  Name: Wanda Mcintosh MRN: [id number] Date of Birth: 1965-05-27 Referring Provider: Dr. Posey Pronto in May, orthopedic next week    Encounter Date: 07/07/2017  PT End of Session - 07/07/17 1236    Visit Number  4    Number of Visits  17    Date for PT Re-Evaluation  07/13/17    Authorization Type  [employer] medicare/medicaid  (PN next session)    PT Start Time  1148    PT Stop Time  1229    PT Time Calculation (min)  41 min    Activity Tolerance  Patient tolerated treatment well    Behavior During Therapy  [facility] for tasks assessed/performed       Past Medical History:  Diagnosis Date  . Anemia   . Back injury   . Barrett's esophagus   . 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 Chronic back pain   . Complication of anesthesia    possible aggression coming out of anesthesia  . Gallstones   . GERD (gastroesophageal reflux disease)   . Head ache   . Migraine   . Osteoarthritis   . Status post dilation of esophageal narrowing   . Vertigo     Past Surgical History:  Procedure Laterality Date  . BREAST SURGERY Bilateral    rt fibroid 90. lft 94  . CARPAL TUNNEL RELEASE Left   . CARPAL TUNNEL RELEASE Right 12/2016  . CHOLECYSTECTOMY  07/16/2011   Procedure: LAPAROSCOPIC CHOLECYSTECTOMY WITH INTRAOPERATIVE CHOLANGIOGRAM;  Surgeon: Merrie Roof, MD;  Location: [facility];  Service: General;  Laterality: N/A;  . HAND SURGERY     cartilage  . LEEP    . TONSILLECTOMY      There were no vitals filed for this visit.  Subjective Assessment - 07/07/17 1152    Subjective  I had trigger point injections done on my back. I will be getting an injection in my knee on May 3rd. My back is doing OK, I would like to build up my core a bit more.     Pertinent History  obesity, chronic back pain, vertigo, knee  pain     Patient Stated Goals  get stronger in general, build muscles back up     Currently in Pain?  Yes    Pain Score  6     Pain Location  Other (Comment) knee, groin, back     Pain Orientation  Lower;Left    Pain Descriptors / Indicators  Burning    Pain Type  Chronic pain    Pain Radiating Towards  none     Pain Onset  More than a month ago    Pain Frequency  Intermittent    Aggravating Factors   walking     Pain Relieving Factors  heat     Effect of Pain on Daily Activities  moderate                        [facility] Adult PT Treatment/Exercise - 07/07/17 0001      Lumbar Exercises: Stretches   Piriformis Stretch  2 reps;30 seconds      Lumbar Exercises: Standing   Other Standing Lumbar Exercises  forward step ups on 6 inch box overhead press 10# kettlebell 1x10 B; standing marches iwth overhead press  kettle bell 10# B      Other Standing Lumbar Exercises  deadlifts 10# kettle bell 1x10 mod cues for form; functional squats 1x15 cues for form       Lumbar Exercises: Seated   Other Seated Lumbar Exercises  lateral trunk crunches 1x10 B      Manual Therapy   Manual Therapy  Soft tissue mobilization    Manual therapy comments  separate from all other skilled services     Soft tissue mobilization  B hip flexors, ball assisted              PT Education - 07/07/17 1235    Education provided  Yes    Education Details  alternative hip flexor stretches, cues for exercise form thorughout session     Person(s) Educated  Patient    Methods  Explanation    Comprehension  Verbalized understanding;Need further instruction       PT Short Term Goals - 06/15/17 1249      PT SHORT TERM GOAL #1   Title  Patient to be able to maintain upright posture without external cues at least 75% of the time in order to reduce pain and improve functional mechanics     Time  4    Period  Weeks    Status  New    Target Date  07/13/17      PT SHORT TERM GOAL #2   Title   Patient to demonstrate lumbar ROM as being full in all planes in order to show improved mobility and flexibilty and to reduce pain     Time  4    Period  Weeks    Status  New      PT SHORT TERM GOAL #3   Title  Patient to demonstrate resolution of HS and piriformis muscle flexibility deficits in order to reduce pain and improve mechanics     Time  4    Period  Weeks    Status  New      PT SHORT TERM GOAL #4   Title  Patient to be independent in appropriate and progressive HEP, to be updated PRN     Time  2    Period  Weeks    Status  New    Target Date  06/29/17        PT Long Term Goals - 06/15/17 1252      PT LONG TERM GOAL #1   Title  Patient to demonstrate improvement in MMT by at least 1 grade in all tested groups and will be able to maintain quality TA activation during all activity to show improved functional strength     Time  8    Period  Weeks    Status  New    Target Date  08/10/17      PT LONG TERM GOAL #2   Title  Patient to be able to stand and walk for 60 minutes without increase in pain in order to show improvement in condition     Time  8    Period  Weeks    Status  New      PT LONG TERM GOAL #3   Title  Patient to be able to perform 5x sit to stand in 15 seconds or less and feet no wider apart than shoulders in order to show improved functional strength and mobility     Time  8    Period  Weeks    Status  New      PT LONG TERM GOAL #4   Title  Patient to show improved gait pattern to include reduced scissoring during gait, improved ankle stability, and equal stride lengths/stance times in order to show overall improvement in efficiency of mobility     Time  8    Period  Weeks    Status  New            Plan - 07/07/17 1236    Clinical Impression Statement  Began with STM to B hip flexors, then explored alternative options for hip flexor stretching at home, patient very limber but reports ongoing symptoms and tightness in B hip flexor regions  today. Otherwise continued with more advanced functional strengthening and core activation techniques as tolerated this session. Patient continues to do well moving forward.     Rehab Potential  Good    Clinical Impairments Affecting Rehab Potential  (+) motivated to participate, success with PT in the past, (-) obesity, chronicity of pain, concurrent knee pain     PT Frequency  2x / week    PT Duration  8 weeks    PT Treatment/Interventions  ADLs/Self Care Home Management;Cryotherapy;Electrical Stimulation;Moist Heat;Traction;Ultrasound;Gait training;Stair training;Functional mobility training;Therapeutic activities;Therapeutic exercise;Balance training;Neuromuscular re-education;Patient/family education;Manual techniques;Dry needling    PT Next Visit Plan  re-assess standing hip flexor stretches prior to STM, continue hip flexor STM B. Continued advanced core strengthening.     PT Home Exercise Plan  Eval: Frog stretch, HS stretch, piriformis stretch, lumbar rotations, hip flexor stretch, self massage to hip flexors     Consulted and Agree with Plan of Care  Patient       Patient will benefit from skilled therapeutic intervention in order to improve the following deficits and impairments:  Abnormal gait, Increased fascial restricitons, Improper body mechanics, Pain, Decreased coordination, Decreased mobility, Increased muscle spasms, Postural dysfunction, Decreased strength, Difficulty walking, Impaired flexibility  Visit Diagnosis: Chronic bilateral low back pain without sciatica  Muscle weakness (generalized)  Stiffness of left hip, not elsewhere classified  Stiffness of right hip, not elsewhere classified  Abnormal posture     Problem List Patient Active Problem List   Diagnosis Date Noted  . Cholelithiasis 06/21/2011    Deniece Ree PT, DPT, CBIS  Supplemental Physical Therapist [facility]   Pager [facility] [address] [place], [place], [address] Phone: [phone]   Fax:  [phone]  Name: TARISA PAOLA MRN: [id number] Date of Birth: 1966/02/16

## 2017-07-07 NOTE — Addendum Note (Signed)
Addended by: Delice Lesch A on: 07/07/2017 12:58 PM   Modules accepted: Level of Service

## 2017-07-07 NOTE — Progress Notes (Signed)
Trigger point injection procedure note: Trigger Point Injection: Written consent was obtained for the patient. Trigger points were identified of b/l thoracolumbosacral PSPs and gluteal muscles. The areas were cleaned with alcohol, vapocoolant spray applied, and each of  these trigger points were injected with 1 cc of 0.5% Marcaine (x9). Needle draw back was performed. There were no complications from the procedure, and it was well tolerated.

## 2017-07-07 NOTE — Progress Notes (Addendum)
Subjective:    Patient ID: Wanda Mcintosh, female    DOB: 23-Apr-1965, 52 y.o.   MRN: [id number]  HPI  52 y/o female with pmh of chronic pain syndrome, b/l SI joint pain, migraine, vertigo presents for follow up of low back pain.   Initially stated it started 12/2008, progressively getting worse after a fall.  Heat improves the pain.  Standing, prolonged walking.  Dull and intense.  Non-radiating.  Constant.  Denies associated weakness and numbness.  Had steroid injections in SI.  Lumbar ESIs and RFAs do not last.  Hydrocodone helps. Mechanical fall in November.  Pain limits cooking and cleaning. Pt current on disability.  Last visit 06/01/16. Pt's pain has returned and she requests trigger point injections.  In addition, pt notes pain in her left knee and wants to discuss possibility of injections. She notes she had xrays from Ortho, which showed b/l OA.    Pain Inventory Average Pain 3 Pain Right Now 5 My pain is intermittent, dull and aching  In the last 24 hours, has pain interfered with the following? General activity 5 Relation with others 2 Enjoyment of life 6 What TIME of day is your pain at its worst? morning  Sleep (in general) Poor  Pain is worse with: bending, inactivity, standing and some activites Pain improves with: rest, heat/ice and injections Relief from Meds: n/a  Mobility walk without assistance how many minutes can you walk? 15 ability to climb steps?  yes do you drive?  yes transfers alone Do you have any goals in this area?  yes  Function disabled: date disabled .  Neuro/Psych No problems in this area  Prior Studies Any changes since last visit?  no  Physicians involved in your care Any changes since last visit?  no   Family History  Problem Relation Age of Onset  . Hypertension Mother   . Breast cancer Mother   . Colon polyps Father   . Hypertension Father   . Diabetes Maternal Grandmother   . Diabetes Paternal Grandmother   .  Prostate cancer Paternal Grandfather   . Diabetes Maternal Aunt   . Irritable bowel syndrome Son   . Colon polyps Paternal Uncle   . Prostate cancer Paternal Uncle   . Colon polyps Paternal Uncle   . Colon cancer Neg Hx   . Esophageal cancer Neg Hx   . Stomach cancer Neg Hx    Social History   Socioeconomic History  . Marital status: Single    Spouse name: Not on file  . Number of children: 3  . Years of education: Not on file  . Highest education level: Not on file  Occupational History  . Occupation: disabled/Nurse  Social Needs  . Financial resource strain: Not on file  . Food insecurity:    Worry: Not on file    Inability: Not on file  . Transportation needs:    Medical: Not on file    Non-medical: Not on file  Tobacco Use  . Smoking status: Never Smoker  . Smokeless tobacco: Never Used  . Tobacco comment: smoked in 7yh grade, occ alcohol  Substance and Sexual Activity  . Alcohol use: Yes    Comment: occasional  . Drug use: No  . Sexual activity: Yes    Birth control/protection: Coitus interruptus  Lifestyle  . Physical activity:    Days per week: Not on file    Minutes per session: Not on file  . Stress: Not on file  Relationships  . Social connections:    Talks on phone: Not on file    Gets together: Not on file    Attends religious service: Not on file    Active member of club or organization: Not on file    Attends meetings of clubs or organizations: Not on file    Relationship status: Not on file  Other Topics Concern  . Not on file  Social History Narrative  . Not on file   Past Surgical History:  Procedure Laterality Date  . BREAST SURGERY Bilateral    rt fibroid 90. lft 94  . CARPAL TUNNEL RELEASE Left   . CARPAL TUNNEL RELEASE Right 12/2016  . CHOLECYSTECTOMY  07/16/2011   Procedure: LAPAROSCOPIC CHOLECYSTECTOMY WITH INTRAOPERATIVE CHOLANGIOGRAM;  Surgeon: Merrie Roof, MD;  Location: [facility];  Service: General;  Laterality: N/A;  . HAND  SURGERY     cartilage  . LEEP    . TONSILLECTOMY     Past Medical History:  Diagnosis Date  . Anemia   . Back injury   . Barrett's esophagus   . 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 Chronic back pain   . Complication of anesthesia    possible aggression coming out of anesthesia  . Gallstones   . GERD (gastroesophageal reflux disease)   . Head ache   . Migraine   . Osteoarthritis   . Status post dilation of esophageal narrowing   . Vertigo    BP 113/82   Pulse 87   Ht 5\' 8"  (1.727 m) Comment: pt reported  Wt 264 lb 6.4 oz (119.9 kg)   SpO2 96%   BMI 40.20 kg/m   Opioid Risk Score:   Fall Risk Score:  `1  Depression screen PHQ 2/9  Depression screen [facility] 2/9 07/07/2017 06/23/2016 05/20/2016 04/16/2016  Decreased Interest 0 0 0 0  Down, Depressed, Hopeless 0 0 0 0  PHQ - 2 Score 0 0 0 0  Altered sleeping 0 - - 3  Tired, decreased energy 0 - - 2  Change in appetite 0 - - 0  Feeling bad or failure about yourself  0 - - 1  Trouble concentrating 0 - - 0  Moving slowly or fidgety/restless 0 - - 0  Suicidal thoughts 0 - - 0  PHQ-9 Score 0 - - 6  Difficult doing work/chores Not difficult at all - - Somewhat difficult    Review of Systems  Constitutional: Positive for diaphoresis.  HENT: Negative.   Eyes: Negative.   Respiratory: Negative.   Cardiovascular: Negative.   Gastrointestinal: Negative.   Endocrine: Negative.   Genitourinary: Negative.   Musculoskeletal: Positive for arthralgias, back pain and gait problem.  Skin: Positive for rash.  Allergic/Immunologic: Negative.   Hematological: Negative.   Psychiatric/Behavioral: Negative.   All other systems reviewed and are negative.     Objective:   Physical Exam Gen: NAD. Vital signs reviewed. Obese HENT: Normocephalic, Atraumatic Eyes: EOMI, No discharge.  Cardio: RRR. No JVD. Pulm: B/l clear to auscultation.  Effort normal Abd: Soft, BS+ MSK:  Gait slightly antalgic  +TTP left knee, pain with ROM  +TTP  thoracolumbosacral PSPs and gluteal muscles  No edema.  Neuro:   Strength  5/5 in all LE myotomes Skin: Warm and Dry. Intact. Psych: Normal mood and behavior.     Assessment & Plan:  52 y/o female with pmh of chronic pain syndrome, b/l SI joint pain, migraine, vertigo presents for follow up  low back pain.  1. 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             MRI results reviewed 03/2015, showing lumbar facet arthropathy             Spinal anatomy reviewed with patient             Did not respond to ESIs and RFA at [facility] does not receive benefit from TENS, tyelnol, Robaxin, Chiropracter             Cannot tolerate Mobic, Naproxen, Celebrex, Gabapentin, Baclofen, Diclofenac             Lidoderm ointment, biofreeze, lidoderm patch OTC with minimal benefit             Cont heat             Cont pool therapy when finances allow   May resume PRN Diclofenac 75 BID    Cont Tizanidine 2mg  BID PRN              Cont Cymbalta to 60mg  daily              Cont aerobic exercise, states she does 2/week for 30 minutes             Pt had PT in 2012, will refer again             Will consider Lyrica, but given hx of edema with other meds, will try to avoid                       Will consider topical meds in future              2. Sleep disturbance             Pt stopped elavil             See #7  3. Peripheral neuropathy             Idiopathic             Cont lidoderm ointment  4. Morbid obesity             Pt saw dietitian              Cont to encourage weight loss, again             Discussed breast reduction surgery, pt working with PCP on weight loss  5. B/l sacroiliitis, L>R             Good benefit from SI injections at [facility] bracing, which is helping. Encouraged limited use  6. Myalgias    Good benefit with trigger points in back  7. RLS             Cont requip 0.25mg  qhs  8. Left knee pain   Due to  tendonitis + OA  Will obtain images from Ortho   Cont Voltaren gel  Will schedule for Zilretta injection

## 2017-07-11 ENCOUNTER — Ambulatory Visit: Payer: Medicare HMO | Admitting: Physical Therapy

## 2017-07-11 ENCOUNTER — Encounter: Payer: Self-pay | Admitting: Physical Therapy

## 2017-07-11 DIAGNOSIS — M545 Low back pain: Secondary | ICD-10-CM | POA: Diagnosis not present

## 2017-07-11 DIAGNOSIS — R293 Abnormal posture: Secondary | ICD-10-CM

## 2017-07-11 DIAGNOSIS — M25652 Stiffness of left hip, not elsewhere classified: Secondary | ICD-10-CM

## 2017-07-11 DIAGNOSIS — G8929 Other chronic pain: Secondary | ICD-10-CM

## 2017-07-11 DIAGNOSIS — M6281 Muscle weakness (generalized): Secondary | ICD-10-CM

## 2017-07-11 DIAGNOSIS — M25651 Stiffness of right hip, not elsewhere classified: Secondary | ICD-10-CM

## 2017-07-11 NOTE — Therapy (Signed)
[place], [place], [address] Phone: [phone]   Fax:  [phone]  Physical Therapy Treatment  Patient Details  Name: Wanda [facility] MRN: [id number] Date of Birth: 06/22/1965 Referring Provider: Dr. Posey Pronto in May, orthopedic next week    Encounter Date: 07/11/2017  PT End of Session - 07/11/17 1310    Visit Number  5    Number of Visits  17    Date for PT Re-Evaluation  07/13/17    PT Start Time  8185    PT Stop Time  1234    PT Time Calculation (min)  46 min    Activity Tolerance  Patient tolerated treatment well    Behavior During Therapy  [facility] for tasks assessed/performed       Past Medical History:  Diagnosis Date  . Anemia   . Back injury   . Barrett's esophagus   . Cancer ([place])    cervica-l leep  . Chronic back pain   . Complication of anesthesia    possible aggression coming out of anesthesia  . Gallstones   . GERD (gastroesophageal reflux disease)   . Head ache   . Migraine   . Osteoarthritis   . Status post dilation of esophageal narrowing   . Vertigo     Past Surgical History:  Procedure Laterality Date  . BREAST SURGERY Bilateral    rt fibroid 90. lft 94  . CARPAL TUNNEL RELEASE Left   . CARPAL TUNNEL RELEASE Right 12/2016  . CHOLECYSTECTOMY  07/16/2011   Procedure: LAPAROSCOPIC CHOLECYSTECTOMY WITH INTRAOPERATIVE CHOLANGIOGRAM;  Surgeon: Merrie Roof, MD;  Location: [facility];  Service: General;  Laterality: N/A;  . HAND SURGERY     cartilage  . LEEP    . TONSILLECTOMY      There were no vitals filed for this visit.  Subjective Assessment - 07/11/17 1151    Subjective    Left SI,  groin atteral hip .Marland Kitchen Sitting in warm bath  helps.  Pain reduced in hip flexors stretching  manual.    has been doing the HEP    Currently in Pain?  Yes    Pain Score  4     Pain Location  Hip    Aggravating Factors   walking    Pain Relieving Factors  stretch    Multiple Pain Sites  --  left knee  shooting pain tender.  increased with straightening knee,  ans  sitting too long.    better with change of position lidocream and topical prescriptions   Voltran.                        [facility] PT Treatment/Exercise - 07/11/17 0001      Posture/Postural Control   Posture Comments  education continued in sitting and standing  .  ploe used,  standing at wall ed also sitting.  patient tends to over correct.  Improves with cues.       Self-Care   Self-Care  -- model hip flexor info.  location      Lumbar Exercises: Stretches   Passive Hamstring Stretch  3 reps;30 seconds;Left    Hip Flexor Stretch  3 reps;30 seconds severat positions.  prone passive safe vs off edge with ankl    Standing Side Bend  -- HEP stretching left only    Other Lumbar Stretch Exercise  modified QL stretch diagonally and to same side holding sink  left hand and also holding sink both hands to right side of sink, QL stretch 30 seconds    Other Lumbar Stretch Exercise  sidelying right with left arm overhead to stretch QL  added all  QL to HEP      Lumbar Exercises: Supine   Other Supine Lumbar Exercises  scooting to edge of mat scoot to side as you stand to avoid QL spasm.  Patient able to demo correct technique      Knee/Hip Exercises: Stretches   Piriformis Stretch  3 reps;30 seconds;Left    Other Knee/Hip Stretches  inner grion stretch checked hip abd ROM,  non tight 1 X 30 seconds.    Other Knee/Hip Stretches  prone hip IR/ER 20 seconds 2-3 X each      Manual Therapy   Manual Therapy  Soft tissue mobilization    Manual therapy comments  separate from all other skilled services     Soft tissue mobilization  Left QL strumming  painful then more comfortable             PT Education - 07/11/17 1309    Education provided  Yes    Education Details  Stretch QL,  QL and hip flexor anatomy,  Posture ed,      Methods  Explanation;Tactile cues;Verbal cues;Handout;Demonstration     Comprehension  Verbalized understanding;Returned demonstration       PT Short Term Goals - 07/11/17 1314      PT SHORT TERM GOAL #1   Title  Patient to be able to maintain upright posture without external cues at least 75% of the time in order to reduce pain and improve functional mechanics     Baseline  needs cues tends to over correct in standing in low back    Time  4    Period  Weeks    Status  On-going      PT SHORT TERM GOAL #2   Title  Patient to demonstrate lumbar ROM as being full in all planes in order to show improved mobility and flexibilty and to reduce pain     Baseline  Not yet Full ROM adressing deficits with exercise    Time  4    Period  Weeks    Status  On-going      PT SHORT TERM GOAL #3   Title  Patient to demonstrate resolution of HS and piriformis muscle flexibility deficits in order to reduce pain and improve mechanics     Baseline  hamstrings  and  piriformis continue to have defits however the flexibility is improving    Time  4    Period  Weeks    Status  On-going      PT SHORT TERM GOAL #4   Title  Patient to be independent in appropriate and progressive HEP, to be updated PRN     Baseline  Patient is independent with exercises issued so far    Time  2    Period  Weeks    Status  Partially Met        PT Long Term Goals - 06/15/17 1252      PT LONG TERM GOAL #1   Title  Patient to demonstrate improvement in MMT by at least 1 grade in all tested groups and will be able to maintain quality TA activation during all activity to show improved functional strength     Time  8    Period  Weeks    Status  New    Target Date  08/10/17      PT LONG TERM GOAL #2   Title  Patient to be able to stand and walk for 60 minutes without increase in pain in order to show improvement in condition     Time  8    Period  Weeks    Status  New      PT LONG TERM GOAL #3   Title  Patient to be able to perform 5x sit to stand in 15 seconds or less and feet no wider  apart than shoulders in order to show improved functional strength and mobility     Time  8    Period  Weeks    Status  New      PT LONG TERM GOAL #4   Title  Patient to show improved gait pattern to include reduced scissoring during gait, improved ankle stability, and equal stride lengths/stance times in order to show overall improvement in efficiency of mobility     Time  8    Period  Weeks    Status  New            Plan - 07/11/17 1319    Clinical Impression Statement  STG#4 partially met.   Patient was able to progress HEP for Quadratus lumborum stretching to decrease pain and spasm in low back and hip.  "I feel free for the first time in * years"  The pain is almost gone."      PT Next Visit Plan  Assess knee tracking continue stretching  .and core stabilization    PT Home Exercise Plan  Eval: Frog stretch, HS stretch, piriformis stretch, lumbar rotations, hip flexor stretch, self massage to hip flexors QL stretches multiple positions.    Consulted and Agree with Plan of Care  Patient       Patient will benefit from skilled therapeutic intervention in order to improve the following deficits and impairments:     Visit Diagnosis: Chronic bilateral low back pain without sciatica  Muscle weakness (generalized)  Stiffness of left hip, not elsewhere classified  Stiffness of right hip, not elsewhere classified  Abnormal posture     Problem List Patient Active Problem List   Diagnosis Date Noted  . Cholelithiasis 06/21/2011    HARRIS,KAREN   PTA 07/11/2017, 1:25 PM  [facility] [address] [place], [place], [address] Phone: [phone]   Fax:  [phone]  Name: LASHEIKA ORTLOFF MRN: [id number] Date of Birth: June 23, 1965

## 2017-07-11 NOTE — Patient Instructions (Signed)
Issued QL info and stretching exercises from exercise drawer All issued and practiced. To be done PRN to decrease pain 3 to 5 x each   Holding 10 to 30 seconds

## 2017-07-11 NOTE — Therapy (Signed)
[place], [place], [address] Phone: [phone]   Fax:  [phone]  Physical Therapy Treatment  Patient Details  Name: Wanda [facility] MRN: [id number] Date of Birth: [birth year] Referring Provider: Dr. Posey Pronto in May, orthopedic next week    Encounter Date: 07/11/2017  PT End of Session - 07/11/17 1310    Visit Number  5    Number of Visits  17    Date for PT Re-Evaluation  07/13/17    PT Start Time  6226    PT Stop Time  1234    PT Time Calculation (min)  46 min    Activity Tolerance  Patient tolerated treatment well    Behavior During Therapy  [facility] for tasks assessed/performed       Past Medical History:  Diagnosis Date  . Anemia   . Back injury   . Barrett's esophagus   . Cancer ([place])    cervica-l leep  . Chronic back pain   . Complication of anesthesia    possible aggression coming out of anesthesia  . Gallstones   . GERD (gastroesophageal reflux disease)   . Head ache   . Migraine   . Osteoarthritis   . Status post dilation of esophageal narrowing   . Vertigo     Past Surgical History:  Procedure Laterality Date  . BREAST SURGERY Bilateral    rt fibroid 90. lft 94  . CARPAL TUNNEL RELEASE Left   . CARPAL TUNNEL RELEASE Right 12/2016  . CHOLECYSTECTOMY  07/16/2011   Procedure: LAPAROSCOPIC CHOLECYSTECTOMY WITH INTRAOPERATIVE CHOLANGIOGRAM;  Surgeon: Merrie Roof, MD;  Location: [facility];  Service: General;  Laterality: N/A;  . HAND SURGERY     cartilage  . LEEP    . TONSILLECTOMY      There were no vitals filed for this visit.  Subjective Assessment - 07/11/17 1151    Subjective    Left SI,  groin atteral hip .Marland Kitchen Sitting in warm bath  helps.  Pain reduced in hip flexors stretching  manual.    has been doing the HEP    Currently in Pain?  Yes    Pain Score  4     Pain Location  Hip    Aggravating Factors   walking    Pain Relieving Factors  stretch    Multiple Pain Sites  --  left knee  shooting pain tender.  increased with straightening knee,  ans  sitting too long.    better with change of position lidocream and topical prescriptions   Voltran.                        [place] Adult PT Treatment/Exercise - 07/11/17 0001      Posture/Postural Control   Posture Comments  education continued in sitting and standing  .  ploe used,  standing at wall ed also sitting.  patient tends to over correct.  Improves with cues.       Self-Care   Self-Care  -- model hip flexor info.  location      Lumbar Exercises: Stretches   Passive Hamstring Stretch  3 reps;30 seconds;Left    Hip Flexor Stretch  3 reps;30 seconds severat positions.  prone passive safe vs off edge with ankl    Standing Side Bend  -- HEP stretching left only    Other Lumbar Stretch Exercise  modified QL stretch diagonally and to same side holding sink  left hand and also holding sink both hands to right side of sink, QL stretch 30 seconds    Other Lumbar Stretch Exercise  sidelying right with left arm overhead to stretch QL  added all  QL to HEP      Lumbar Exercises: Supine   Other Supine Lumbar Exercises  scooting to edge of mat scoot to side as you stand to avoid QL spasm.  Patient able to demo correct technique      Knee/Hip Exercises: Stretches   Piriformis Stretch  3 reps;30 seconds;Left    Other Knee/Hip Stretches  inner grion stretch checked hip abd ROM,  non tight 1 X 30 seconds.    Other Knee/Hip Stretches  prone hip IR/ER 20 seconds 2-3 X each      Manual Therapy   Manual Therapy  Soft tissue mobilization    Manual therapy comments  separate from all other skilled services     Soft tissue mobilization  Left QL strumming  painful then more comfortable             PT Education - 07/11/17 1309    Education provided  Yes    Education Details  Stretch QL,  QL and hip flexor anatomy,  Posture ed,      Methods  Explanation;Tactile cues;Verbal cues;Handout;Demonstration     Comprehension  Verbalized understanding;Returned demonstration       PT Short Term Goals - 07/11/17 1314      PT SHORT TERM GOAL #1   Title  Patient to be able to maintain upright posture without external cues at least 75% of the time in order to reduce pain and improve functional mechanics     Baseline  needs cues tends to over correct in standing in low back    Time  4    Period  Weeks    Status  On-going      PT SHORT TERM GOAL #2   Title  Patient to demonstrate lumbar ROM as being full in all planes in order to show improved mobility and flexibilty and to reduce pain     Baseline  Not yet Full ROM adressing deficits with exercise    Time  4    Period  Weeks    Status  On-going      PT SHORT TERM GOAL #3   Title  Patient to demonstrate resolution of HS and piriformis muscle flexibility deficits in order to reduce pain and improve mechanics     Baseline  hamstrings  and  piriformis continue to have defits however the flexibility is improving    Time  4    Period  Weeks    Status  On-going      PT SHORT TERM GOAL #4   Title  Patient to be independent in appropriate and progressive HEP, to be updated PRN     Baseline  Patient is independent with exercises issued so far    Time  2    Period  Weeks    Status  Partially Met        PT Long Term Goals - 06/15/17 1252      PT LONG TERM GOAL #1   Title  Patient to demonstrate improvement in MMT by at least 1 grade in all tested groups and will be able to maintain quality TA activation during all activity to show improved functional strength     Time  8    Period  Weeks    Status  New    Target Date  08/10/17      PT LONG TERM GOAL #2   Title  Patient to be able to stand and walk for 60 minutes without increase in pain in order to show improvement in condition     Time  8    Period  Weeks    Status  New      PT LONG TERM GOAL #3   Title  Patient to be able to perform 5x sit to stand in 15 seconds or less and feet no wider  apart than shoulders in order to show improved functional strength and mobility     Time  8    Period  Weeks    Status  New      PT LONG TERM GOAL #4   Title  Patient to show improved gait pattern to include reduced scissoring during gait, improved ankle stability, and equal stride lengths/stance times in order to show overall improvement in efficiency of mobility     Time  8    Period  Weeks    Status  New            Plan - 07/11/17 1319    Clinical Impression Statement  STG#4 partially met.   Patient was able to progress HEP for Quadratus lumborum stretching to decrease pain and spasm in low back and hip.  "I feel free for the first time in * years"  The pain is almost gone."      PT Next Visit Plan  Assess knee tracking continue stretching  .and core stabilization    PT Home Exercise Plan  Eval: Frog stretch, HS stretch, piriformis stretch, lumbar rotations, hip flexor stretch, self massage to hip flexors QL stretches multiple positions.    Consulted and Agree with Plan of Care  Patient       Patient will benefit from skilled therapeutic intervention in order to improve the following deficits and impairments:     Visit Diagnosis: Chronic bilateral low back pain without sciatica  Muscle weakness (generalized)  Stiffness of left hip, not elsewhere classified  Stiffness of right hip, not elsewhere classified  Abnormal posture     Problem List Patient Active Problem List   Diagnosis Date Noted  . Cholelithiasis 06/21/2011    HARRIS,KAREN  PTA 07/11/2017, 1:38 PM  [facility] [address] [place], [place], [address] Phone: [phone]   Fax:  [phone]  Name: Wanda [facility] MRN: [id number] Date of Birth: [birth year]

## 2017-07-12 ENCOUNTER — Telehealth: Payer: Self-pay

## 2017-07-12 ENCOUNTER — Encounter: Payer: Self-pay | Admitting: Gastroenterology

## 2017-07-12 NOTE — Telephone Encounter (Signed)
She can increase her Tizanidine to 4mg  BID PRN.  She may also come in for repeat trigger point injections. Thanks.

## 2017-07-12 NOTE — Telephone Encounter (Signed)
Pt called stating she went to PT yesterday and ran some errands, last night had a spasm in her back and pain level was a 8 and took a muscle relaxer. Today pain level is at a 6. Advise what to do to.

## 2017-07-12 NOTE — Telephone Encounter (Signed)
Contacted patient, advised patient to double up muscle relaxer. Advised to make appointment for repeat trigger points. Patient agreed.  Appointment set for April 25th at 11 AM

## 2017-07-13 ENCOUNTER — Ambulatory Visit: Payer: Medicare HMO | Admitting: Physical Therapy

## 2017-07-14 ENCOUNTER — Encounter (HOSPITAL_BASED_OUTPATIENT_CLINIC_OR_DEPARTMENT_OTHER): Payer: Medicare HMO | Admitting: Physical Medicine & Rehabilitation

## 2017-07-14 ENCOUNTER — Encounter: Payer: Self-pay | Admitting: Physical Medicine & Rehabilitation

## 2017-07-14 VITALS — BP 122/83 | HR 80 | Ht 68.0 in | Wt 264.0 lb

## 2017-07-14 DIAGNOSIS — G894 Chronic pain syndrome: Secondary | ICD-10-CM | POA: Diagnosis not present

## 2017-07-14 DIAGNOSIS — M791 Myalgia, unspecified site: Secondary | ICD-10-CM | POA: Diagnosis not present

## 2017-07-14 NOTE — Progress Notes (Signed)
Trigger point injection procedure note: Trigger Point Injection: Written consent was obtained for the patient. Trigger points were identified of b/l (L>R) thoracolumbosacral PSPs and gluteal muscles. The areas were cleaned with alcohol, vapocoolant spray applied, and each of  these trigger points were injected with 1 cc of 0.5% Marcaine (x8). Needle draw back was performed. There were no complications from the procedure, and it was well tolerated.

## 2017-07-19 ENCOUNTER — Ambulatory Visit: Payer: Medicare HMO | Admitting: Physical Therapy

## 2017-07-19 ENCOUNTER — Encounter: Payer: Self-pay | Admitting: Physical Therapy

## 2017-07-19 DIAGNOSIS — M25652 Stiffness of left hip, not elsewhere classified: Secondary | ICD-10-CM

## 2017-07-19 DIAGNOSIS — M25651 Stiffness of right hip, not elsewhere classified: Secondary | ICD-10-CM

## 2017-07-19 DIAGNOSIS — M545 Low back pain, unspecified: Secondary | ICD-10-CM

## 2017-07-19 DIAGNOSIS — R293 Abnormal posture: Secondary | ICD-10-CM

## 2017-07-19 DIAGNOSIS — G8929 Other chronic pain: Secondary | ICD-10-CM

## 2017-07-19 DIAGNOSIS — M6281 Muscle weakness (generalized): Secondary | ICD-10-CM

## 2017-07-19 NOTE — Therapy (Signed)
[place], [place], [address] Phone: [phone]   Fax:  [phone]  Physical Therapy Treatment (Re-assessment/PN)  Patient Details  Name: Wanda Mcintosh MRN: [id number] Date of Birth: 09/07/1965 Referring Provider: Dr. Posey Pronto in May, orthopedic next week    Encounter Date: 07/19/2017   Progress Note Reporting Period 06/15/17 to 07/19/17  See note below for Objective Data and Assessment of Progress/Goals.       PT End of Session - 07/19/17 1231    Visit Number  6    Number of Visits  14    Date for PT Re-Evaluation  08/15/17    Authorization Type  [employer] medicare/medicaid  (PN done on 6th session, due on 16th)    PT Start Time  1147    PT Stop Time  1229    PT Time Calculation (min)  42 min    Activity Tolerance  Patient tolerated treatment well    Behavior During Therapy  WFL for tasks assessed/performed       Past Medical History:  Diagnosis Date  . Anemia   . Back injury   . Barrett's esophagus   . 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 Chronic back pain   . Complication of anesthesia    possible aggression coming out of anesthesia  . Gallstones   . GERD (gastroesophageal reflux disease)   . Head ache   . Migraine   . Osteoarthritis   . Status post dilation of esophageal narrowing   . Vertigo     Past Surgical History:  Procedure Laterality Date  . BREAST SURGERY Bilateral    rt fibroid 90. lft 94  . CARPAL TUNNEL RELEASE Left   . CARPAL TUNNEL RELEASE Right 12/2016  . CHOLECYSTECTOMY  07/16/2011   Procedure: LAPAROSCOPIC CHOLECYSTECTOMY WITH INTRAOPERATIVE CHOLANGIOGRAM;  Surgeon: Merrie Roof, MD;  Location: [facility];  Service: General;  Laterality: N/A;  . HAND SURGERY     cartilage  . LEEP    . TONSILLECTOMY      There were no vitals filed for this visit.  Subjective Assessment - 07/19/17 1149    Subjective  When I was here on Monday they massaged a muscle in my back, it  hurt very badly the next day to the point that I had to go to the doctor. I feel like the past couple of sessions really irritated my pain. I couldn't even stand up, my sons had to help me. It is on the mend now, the mornings are the worst. I feel like this has caused a major set back for me. I am getting injection in my knee soon. I felt really good after the last time I did a lot of strengthening.     Pertinent History  obesity, chronic back pain, vertigo, knee pain     How long can you sit comfortably?  5 minutes today     How long can you stand comfortably?  10 minutes     How long can you walk comfortably?  30 minutes     Patient Stated Goals  get stronger in general, build muscles back up     Currently in Pain?  Yes    Pain Score  4     Pain Location  Back    Pain Orientation  Lower;Right;Left    Pain Descriptors / Indicators  Nagging;Dull    Pain Type  Chronic pain    Pain Radiating Towards  none     Pain Onset  More than a month ago    Pain Frequency  Intermittent    Aggravating Factors   sitting up, standing up     Pain Relieving Factors  laying down     Effect of Pain on Daily Activities  severe         [facility] PT Assessment - 07/19/17 0001      AROM   Lumbar Flexion  moderate limitation     Lumbar Extension  excessive     Lumbar - Right Side Bend  WFL     Lumbar - Left Side Bend  [facility]       Strength   Right Hip Flexion  4/5    Right Hip Extension  4-/5    Right Hip ABduction  5/5    Left Hip Flexion  4/5    Left Hip Extension  4-/5    Left Hip ABduction  4/5    Right Knee Flexion  4+/5    Right Knee Extension  5/5    Left Knee Flexion  4+/5    Left Knee Extension  5/5      Flexibility   Hamstrings  moderate limitations     Quadriceps  DNT     Piriformis  moderate limitation       Palpation   Palpation comment  tenderness in bilateral lumbar spine, QLs                    [facility] Adult PT Treatment/Exercise - 07/19/17 0001      Lumbar Exercises:  Supine   Ab Set  15 reps    Clam  15 reps on heat, green TB     Bent Knee Raise  15 reps on moist heat     Dead Bug  --    Straight Leg Raise  10 reps    Straight Leg Raises Limitations  eccentric lower              PT Education - 07/19/17 1230    Education provided  Yes    Education Details  progress with PT despite recent set back, POC moving forward, schedule updates     Person(s) Educated  Patient    Methods  Explanation    Comprehension  Verbalized understanding       PT Short Term Goals - 07/19/17 1205      PT SHORT TERM GOAL #1   Title  Patient to be able to maintain upright posture without external cues at least 75% of the time in order to reduce pain and improve functional mechanics     Baseline  4/30- postural impairments are mostly improved, mild over corrections     Time  4    Period  Weeks    Status  Achieved      PT SHORT TERM GOAL #2   Title  Patient to demonstrate lumbar ROM as being full in all planes in order to show improved mobility and flexibilty and to reduce pain     Baseline  4/30- ongoing     Time  4    Period  Weeks    Status  On-going      PT SHORT TERM GOAL #3   Title  Patient to demonstrate resolution of HS and piriformis muscle flexibility deficits in order to reduce pain and improve mechanics     Baseline  4/30- improving     Time  4  Period  Weeks    Status  On-going      PT SHORT TERM GOAL #4   Title  Patient to be independent in appropriate and progressive HEP, to be updated PRN     Baseline  4/30- fully I, was not able to do HEP this week due to pain following PT     Time  2    Period  Weeks    Status  Achieved        PT Long Term Goals - 07/19/17 1207      PT LONG TERM GOAL #1   Title  Patient to demonstrate improvement in MMT by at least 1 grade in all tested groups and will be able to maintain quality TA activation during all activity to show improved functional strength     Baseline  4/30- improving     Time  8     Period  Weeks    Status  Partially Met      PT LONG TERM GOAL #2   Title  Patient to be able to stand and walk for 60 minutes without increase in pain in order to show improvement in condition     Baseline  4/30- ongoing     Time  8    Period  Weeks    Status  On-going      PT LONG TERM GOAL #3   Title  Patient to be able to perform 5x sit to stand in 15 seconds or less and feet no wider apart than shoulders in order to show improved functional strength and mobility     Baseline  4/30- DNT due to increased pain at time of re-eval     Time  8    Period  Weeks    Status  On-going      PT LONG TERM GOAL #4   Title  Patient to show improved gait pattern to include reduced scissoring during gait, improved ankle stability, and equal stride lengths/stance times in order to show overall improvement in efficiency of mobility     Baseline  4/30 improving, continues to need work with antalgic pain pattern     Time  8    Period  Weeks    Status  On-going            Plan - 07/19/17 1231    Clinical Impression Statement  Re-assessment performed today. Ms. Patnaude arrives voicing significant frustration due to perceived backslide as well as severe increase in pain after past session, and reporting significant pain increase following massage and stretches performed. Examination reveals improved muscle flexibility and strength despite increased pain, patient appears to be making progress towards her goals and will benefit from skilled PT services to address functional strength deficits, flexibility deficits, and to further reduce pain moving forward.     Rehab Potential  Good    Clinical Impairments Affecting Rehab Potential  (+) motivated to participate, success with PT in the past, (-) obesity, chronicity of pain, concurrent knee pain     PT Frequency  2x / week    PT Duration  4 weeks    PT Treatment/Interventions  ADLs/Self Care Home Management;Cryotherapy;Electrical Stimulation;Moist  Heat;Traction;Ultrasound;Gait training;Stair training;Functional mobility training;Therapeutic activities;Therapeutic exercise;Balance training;Neuromuscular re-education;Patient/family education;Manual techniques;Dry needling    PT Next Visit Plan  continue with advanced functional core strengthening as tolerated, avoid QL stretching or massage as patient now refusing it     PT Home Exercise Plan  Eval: Frog stretch, HS stretch,  piriformis stretch, lumbar rotations, hip flexor stretch, self massage to hip flexors QL stretches multiple positions.    Consulted and Agree with Plan of Care  Patient       Patient will benefit from skilled therapeutic intervention in order to improve the following deficits and impairments:  Abnormal gait, Increased fascial restricitons, Improper body mechanics, Pain, Decreased coordination, Decreased mobility, Increased muscle spasms, Postural dysfunction, Decreased strength, Difficulty walking, Impaired flexibility  Visit Diagnosis: Chronic bilateral low back pain without sciatica  Muscle weakness (generalized)  Stiffness of left hip, not elsewhere classified  Stiffness of right hip, not elsewhere classified  Abnormal posture     Problem List Patient Active Problem List   Diagnosis Date Noted  . Cholelithiasis 06/21/2011    Deniece Ree PT, DPT, CBIS  Supplemental Physical Therapist [place]   Pager [facility] [address] [place], [place], [address] Phone: [phone]   Fax:  [phone]  Name: DESIREA MIZRAHI MRN: [id number] Date of Birth: Jan 16, 1966

## 2017-07-21 ENCOUNTER — Encounter: Payer: Self-pay | Admitting: Physical Medicine & Rehabilitation

## 2017-07-21 ENCOUNTER — Ambulatory Visit: Payer: Medicare HMO | Admitting: Physical Therapy

## 2017-07-21 ENCOUNTER — Encounter: Payer: Medicare HMO | Attending: Physical Medicine & Rehabilitation | Admitting: Physical Medicine & Rehabilitation

## 2017-07-21 VITALS — BP 112/77 | HR 74 | Ht 68.0 in | Wt 265.0 lb

## 2017-07-21 DIAGNOSIS — M545 Low back pain: Secondary | ICD-10-CM | POA: Insufficient documentation

## 2017-07-21 DIAGNOSIS — G43909 Migraine, unspecified, not intractable, without status migrainosus: Secondary | ICD-10-CM | POA: Diagnosis not present

## 2017-07-21 DIAGNOSIS — G629 Polyneuropathy, unspecified: Secondary | ICD-10-CM | POA: Insufficient documentation

## 2017-07-21 DIAGNOSIS — G479 Sleep disorder, unspecified: Secondary | ICD-10-CM | POA: Insufficient documentation

## 2017-07-21 DIAGNOSIS — M461 Sacroiliitis, not elsewhere classified: Secondary | ICD-10-CM | POA: Insufficient documentation

## 2017-07-21 DIAGNOSIS — G894 Chronic pain syndrome: Secondary | ICD-10-CM | POA: Diagnosis not present

## 2017-07-21 DIAGNOSIS — R42 Dizziness and giddiness: Secondary | ICD-10-CM | POA: Insufficient documentation

## 2017-07-21 DIAGNOSIS — M1712 Unilateral primary osteoarthritis, left knee: Secondary | ICD-10-CM

## 2017-07-21 DIAGNOSIS — M533 Sacrococcygeal disorders, not elsewhere classified: Secondary | ICD-10-CM | POA: Insufficient documentation

## 2017-07-21 NOTE — Progress Notes (Signed)
Knee injection   Indication: Knee pain not relieved by medication management and other conservative care.  Informed consent was obtained after describing risks and benefits of the procedure with the patient, this includes bleeding, bruising, infection and medication side effects. The patient wishes to proceed and has given written consent. Patient was placed in a seated position. The left knee was marked and prepped with betadine in the anteromedial area. Vapocoolant spray was applied. A 21-gauge 2 inch needle was inserted into the joint space. After negative draw back for blood, a solution of Zilretta was injected. A band aid was applied. The patient tolerated the procedure well. Post procedure instructions were given.

## 2017-07-26 ENCOUNTER — Ambulatory Visit: Payer: Medicare HMO | Attending: Physical Medicine & Rehabilitation | Admitting: Physical Therapy

## 2017-07-26 ENCOUNTER — Telehealth: Payer: Self-pay | Admitting: Physical Therapy

## 2017-07-26 DIAGNOSIS — M545 Low back pain: Secondary | ICD-10-CM | POA: Insufficient documentation

## 2017-07-26 DIAGNOSIS — R293 Abnormal posture: Secondary | ICD-10-CM | POA: Insufficient documentation

## 2017-07-26 DIAGNOSIS — M25651 Stiffness of right hip, not elsewhere classified: Secondary | ICD-10-CM | POA: Insufficient documentation

## 2017-07-26 DIAGNOSIS — M25652 Stiffness of left hip, not elsewhere classified: Secondary | ICD-10-CM | POA: Insufficient documentation

## 2017-07-26 DIAGNOSIS — M6281 Muscle weakness (generalized): Secondary | ICD-10-CM | POA: Insufficient documentation

## 2017-07-26 DIAGNOSIS — G8929 Other chronic pain: Secondary | ICD-10-CM | POA: Insufficient documentation

## 2017-07-26 NOTE — Telephone Encounter (Signed)
No show. Called and LMOM.  Deniece Ree PT, DPT, CBIS  Supplemental Physical Therapist [facility]   Pager [phone]

## 2017-07-28 ENCOUNTER — Ambulatory Visit: Payer: Medicare HMO | Admitting: Physical Therapy

## 2017-07-29 ENCOUNTER — Encounter: Payer: Medicare HMO | Admitting: Physical Medicine & Rehabilitation

## 2017-07-30 ENCOUNTER — Other Ambulatory Visit: Payer: Self-pay | Admitting: Physical Medicine & Rehabilitation

## 2017-08-02 ENCOUNTER — Encounter: Payer: Self-pay | Admitting: Physical Therapy

## 2017-08-02 ENCOUNTER — Ambulatory Visit: Payer: Medicare HMO | Admitting: Physical Therapy

## 2017-08-02 DIAGNOSIS — M25652 Stiffness of left hip, not elsewhere classified: Secondary | ICD-10-CM | POA: Diagnosis present

## 2017-08-02 DIAGNOSIS — M545 Low back pain: Principal | ICD-10-CM

## 2017-08-02 DIAGNOSIS — R293 Abnormal posture: Secondary | ICD-10-CM

## 2017-08-02 DIAGNOSIS — M6281 Muscle weakness (generalized): Secondary | ICD-10-CM

## 2017-08-02 DIAGNOSIS — M25651 Stiffness of right hip, not elsewhere classified: Secondary | ICD-10-CM | POA: Diagnosis present

## 2017-08-02 DIAGNOSIS — G8929 Other chronic pain: Secondary | ICD-10-CM | POA: Diagnosis present

## 2017-08-02 NOTE — Therapy (Signed)
[place], [place], [address] Phone: [phone]   Fax:  [phone]  Physical Therapy Treatment  Patient Details  Name: Wanda Mcintosh MRN: [id number] Date of Birth: 17-Sep-1965 Referring Provider: Dr. Posey Pronto in May, orthopedic next week    Encounter Date: 08/02/2017  PT End of Session - 08/02/17 1214    Visit Number  7    Number of Visits  14    Date for PT Re-Evaluation  08/15/17    Authorization Type  [employer] medicare/medicaid  (PN done on 6th session, due on 16th)    PT Start Time  1145    PT Stop Time  1233    PT Time Calculation (min)  48 min       Past Medical History:  Diagnosis Date  . Anemia   . Back injury   . Barrett's esophagus   . 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 Chronic back pain   . Complication of anesthesia    possible aggression coming out of anesthesia  . Gallstones   . GERD (gastroesophageal reflux disease)   . Head ache   . Migraine   . Osteoarthritis   . Status post dilation of esophageal narrowing   . Vertigo     Past Surgical History:  Procedure Laterality Date  . BREAST SURGERY Bilateral    rt fibroid 90. lft 94  . CARPAL TUNNEL RELEASE Left   . CARPAL TUNNEL RELEASE Right 12/2016  . CHOLECYSTECTOMY  07/16/2011   Procedure: LAPAROSCOPIC CHOLECYSTECTOMY WITH INTRAOPERATIVE CHOLANGIOGRAM;  Surgeon: Merrie Roof, MD;  Location: [facility];  Service: General;  Laterality: N/A;  . HAND SURGERY     cartilage  . LEEP    . TONSILLECTOMY      There were no vitals filed for this visit.  Subjective Assessment - 08/02/17 1149    Subjective  Knee is alot better since injection a week and a half ago. Back was good before the weather changed. Achey right how.     Currently in Pain?  Yes    Pain Score  3     Pain Location  Back    Pain Orientation  Lower;Right    Pain Descriptors / Indicators  Aching    Aggravating Factors   weather, standing, stop and go walking      Pain Relieving Factors  heat, laying on side                        [facility] Adult PT Treatment/Exercise - 08/02/17 0001      Ambulation/Gait   Gait Comments  no gait deviations today.       Lumbar Exercises: Stretches   Active Hamstring Stretch  3 reps;30 seconds    Hip Flexor Stretch  1 rep;30 seconds;Right;Left      Lumbar Exercises: Seated   Sit to Stand  10 reps focusing eccentric control      Lumbar Exercises: Supine   Ab Set  5 reps    Bent Knee Raise Limitations  one knee at a time to table top, then one knee down at a time.     Straight Leg Raise  10 reps abdominal brace     Straight Leg Raises Limitations  eccentric lower       Knee/Hip Exercises: Stretches   Piriformis Stretch  3 reps;30 seconds;Left figure 4       Knee/Hip Exercises: Standing   SLS  60 sec each with decreased motor control on left       Modalities   Modalities  Moist Heat      Moist Heat Therapy   Number Minutes Moist Heat  10 Minutes    Moist Heat Location  Lumbar Spine               PT Short Term Goals - 07/19/17 1205      PT SHORT TERM GOAL #1   Title  Patient to be able to maintain upright posture without external cues at least 75% of the time in order to reduce pain and improve functional mechanics     Baseline  4/30- postural impairments are mostly improved, mild over corrections     Time  4    Period  Weeks    Status  Achieved      PT SHORT TERM GOAL #2   Title  Patient to demonstrate lumbar ROM as being full in all planes in order to show improved mobility and flexibilty and to reduce pain     Baseline  4/30- ongoing     Time  4    Period  Weeks    Status  On-going      PT SHORT TERM GOAL #3   Title  Patient to demonstrate resolution of HS and piriformis muscle flexibility deficits in order to reduce pain and improve mechanics     Baseline  4/30- improving     Time  4    Period  Weeks    Status  On-going      PT SHORT TERM GOAL #4   Title  Patient to  be independent in appropriate and progressive HEP, to be updated PRN     Baseline  4/30- fully I, was not able to do HEP this week due to pain following PT     Time  2    Period  Weeks    Status  Achieved        PT Long Term Goals - 07/19/17 1207      PT LONG TERM GOAL #1   Title  Patient to demonstrate improvement in MMT by at least 1 grade in all tested groups and will be able to maintain quality TA activation during all activity to show improved functional strength     Baseline  4/30- improving     Time  8    Period  Weeks    Status  Partially Met      PT LONG TERM GOAL #2   Title  Patient to be able to stand and walk for 60 minutes without increase in pain in order to show improvement in condition     Baseline  4/30- ongoing     Time  8    Period  Weeks    Status  On-going      PT LONG TERM GOAL #3   Title  Patient to be able to perform 5x sit to stand in 15 seconds or less and feet no wider apart than shoulders in order to show improved functional strength and mobility     Baseline  4/30- DNT due to increased pain at time of re-eval     Time  8    Period  Weeks    Status  On-going      PT LONG TERM GOAL #4   Title  Patient to show improved gait pattern to include reduced scissoring during gait, improved ankle stability, and equal stride lengths/stance  times in order to show overall improvement in efficiency of mobility     Baseline  4/30 improving, continues to need work with antalgic pain pattern     Time  8    Period  Weeks    Status  On-going            Plan - 08/02/17 1156    Clinical Impression Statement  Pt arrives reporting absence last week due to boyfriend's medical issues. Pt reports she has noticed she can walk for longer, up to 10 minutes without increased pain however she is fearful of sudden onset of sharp pain. Still mild tightness in bilateral hS and piriformis. Progressing toward STGs. Added Sit-stands working on eccentric control. Progressed core  to table top holds without increased pain. Able to hold 60 sec SLS bilateral however demonstrates decreased motor control on left. Updated HEP with sit-stands, SLS, and supine table tops.     PT Next Visit Plan  continue with advanced functional core strengthening as tolerated, avoid QL stretching or massage as patient now refusing it ; assess tolerance to new HEP.     PT Home Exercise Plan  Eval: Frog stretch, HS stretch, piriformis stretch, lumbar rotations, hip flexor stretch, self massage to hip flexors QL stretches multiple positions, sit-stand, table top, SLS     Consulted and Agree with Plan of Care  Patient       Patient will benefit from skilled therapeutic intervention in order to improve the following deficits and impairments:  Abnormal gait, Increased fascial restricitons, Improper body mechanics, Pain, Decreased coordination, Decreased mobility, Increased muscle spasms, Postural dysfunction, Decreased strength, Difficulty walking, Impaired flexibility  Visit Diagnosis: Chronic bilateral low back pain without sciatica  Muscle weakness (generalized)  Stiffness of left hip, not elsewhere classified  Stiffness of right hip, not elsewhere classified  Abnormal posture     Problem List Patient Active Problem List   Diagnosis Date Noted  . Cholelithiasis 06/21/2011    Dorene Ar, PTA 08/02/2017, 12:35 PM  [facility] [address] [place], [place], [address] Phone: [phone]   Fax:  [phone]  Name: Wanda Mcintosh MRN: [id number] Date of Birth: April 22, 1965

## 2017-08-04 ENCOUNTER — Encounter (HOSPITAL_BASED_OUTPATIENT_CLINIC_OR_DEPARTMENT_OTHER): Payer: Medicare HMO | Admitting: Physical Medicine & Rehabilitation

## 2017-08-04 ENCOUNTER — Encounter: Payer: Self-pay | Admitting: Physical Medicine & Rehabilitation

## 2017-08-04 ENCOUNTER — Encounter: Payer: Self-pay | Admitting: Physical Therapy

## 2017-08-04 ENCOUNTER — Other Ambulatory Visit: Payer: Self-pay

## 2017-08-04 ENCOUNTER — Ambulatory Visit: Payer: Medicare HMO | Admitting: Physical Therapy

## 2017-08-04 VITALS — BP 114/75 | HR 84 | Ht 68.0 in | Wt 263.8 lb

## 2017-08-04 DIAGNOSIS — M1712 Unilateral primary osteoarthritis, left knee: Secondary | ICD-10-CM

## 2017-08-04 DIAGNOSIS — G479 Sleep disorder, unspecified: Secondary | ICD-10-CM | POA: Diagnosis not present

## 2017-08-04 DIAGNOSIS — G8929 Other chronic pain: Secondary | ICD-10-CM

## 2017-08-04 DIAGNOSIS — M545 Low back pain, unspecified: Secondary | ICD-10-CM

## 2017-08-04 DIAGNOSIS — M6281 Muscle weakness (generalized): Secondary | ICD-10-CM

## 2017-08-04 DIAGNOSIS — G894 Chronic pain syndrome: Secondary | ICD-10-CM

## 2017-08-04 DIAGNOSIS — M47817 Spondylosis without myelopathy or radiculopathy, lumbosacral region: Secondary | ICD-10-CM

## 2017-08-04 DIAGNOSIS — R293 Abnormal posture: Secondary | ICD-10-CM

## 2017-08-04 DIAGNOSIS — M791 Myalgia, unspecified site: Secondary | ICD-10-CM

## 2017-08-04 DIAGNOSIS — M25652 Stiffness of left hip, not elsewhere classified: Secondary | ICD-10-CM

## 2017-08-04 DIAGNOSIS — M25562 Pain in left knee: Secondary | ICD-10-CM

## 2017-08-04 DIAGNOSIS — M25651 Stiffness of right hip, not elsewhere classified: Secondary | ICD-10-CM

## 2017-08-04 NOTE — Therapy (Signed)
[place], [place], [address] Phone: [phone]   Fax:  [phone]  Physical Therapy Treatment  Patient Details  Name: Wanda Mcintosh MRN: [id number] Date of Birth: [birth year] Referring Provider: Dr. Posey Pronto in May, orthopedic next week    Encounter Date: 08/04/2017  PT End of Session - 08/04/17 1245    Visit Number  8    Number of Visits  14    Date for PT Re-Evaluation  08/15/17    Authorization Type  [employer] medicare/medicaid  (PN done on 6th session, due on 16th)    PT Start Time  1235    PT Stop Time  1315    PT Time Calculation (min)  40 min       Past Medical History:  Diagnosis Date  . Anemia   . Back injury   . Barrett's esophagus   . Cancer ([place])    cervica-l leep  . Chronic back pain   . Complication of anesthesia    possible aggression coming out of anesthesia  . Gallstones   . GERD (gastroesophageal reflux disease)   . Head ache   . Migraine   . Osteoarthritis   . Status post dilation of esophageal narrowing   . Vertigo     Past Surgical History:  Procedure Laterality Date  . BREAST SURGERY Bilateral    rt fibroid 90. lft 94  . CARPAL TUNNEL RELEASE Left   . CARPAL TUNNEL RELEASE Right 12/2016  . CHOLECYSTECTOMY  07/16/2011   Procedure: LAPAROSCOPIC CHOLECYSTECTOMY WITH INTRAOPERATIVE CHOLANGIOGRAM;  Surgeon: Merrie Roof, MD;  Location: [facility];  Service: General;  Laterality: N/A;  . HAND SURGERY     cartilage  . LEEP    . TONSILLECTOMY      There were no vitals filed for this visit.  Subjective Assessment - 08/04/17 1237    Subjective  30 minutes of walking earlier caused hip flexor tighntess and cause moe ankle pain.  Felt good after last session. 1/10 pain today, mild.     Currently in Pain?  Yes    Pain Score  1     Pain Location  Back    Pain Orientation  Lower    Pain Descriptors / Indicators  -- mild    Pain Radiating Towards  anterior left hip                         [facility] Adult PT Treatment/Exercise - 08/04/17 0001      Lumbar Exercises: Stretches   Active Hamstring Stretch  3 reps;30 seconds    Hip Flexor Stretch  1 rep;30 seconds;Right;Left    Figure 4 Stretch  30 seconds;3 reps      Lumbar Exercises: Seated   Sit to Stand  10 reps focusing eccentric control      Lumbar Exercises: Supine   Clam  10 reps focusing eccentric control and tra brace, blue    Bent Knee Raise  10 reps    Bent Knee Raise Limitations  then one knee at a time to table top, then one knee down at a time.  10 sec holds in table top x 10    Straight Leg Raise  10 reps abdominal brace     Straight Leg Raises Limitations  eccentric lower       Knee/Hip Exercises: Stretches   Piriformis Stretch  3 reps;30 seconds;Left figure 4  Knee/Hip Exercises: Standing   SLS  60 sec each with decreased motor control on left  added foam pad, needs UE support for left LE      Moist Heat Therapy   Number Minutes Moist Heat  --    Moist Heat Location  --               PT Short Term Goals - 07/19/17 1205      PT SHORT TERM GOAL #1   Title  Patient to be able to maintain upright posture without external cues at least 75% of the time in order to reduce pain and improve functional mechanics     Baseline  4/30- postural impairments are mostly improved, mild over corrections     Time  4    Period  Weeks    Status  Achieved      PT SHORT TERM GOAL #2   Title  Patient to demonstrate lumbar ROM as being full in all planes in order to show improved mobility and flexibilty and to reduce pain     Baseline  4/30- ongoing     Time  4    Period  Weeks    Status  On-going      PT SHORT TERM GOAL #3   Title  Patient to demonstrate resolution of HS and piriformis muscle flexibility deficits in order to reduce pain and improve mechanics     Baseline  4/30- improving     Time  4    Period  Weeks    Status  On-going      PT SHORT TERM GOAL #4    Title  Patient to be independent in appropriate and progressive HEP, to be updated PRN     Baseline  4/30- fully I, was not able to do HEP this week due to pain following PT     Time  2    Period  Weeks    Status  Achieved        PT Long Term Goals - 07/19/17 1207      PT LONG TERM GOAL #1   Title  Patient to demonstrate improvement in MMT by at least 1 grade in all tested groups and will be able to maintain quality TA activation during all activity to show improved functional strength     Baseline  4/30- improving     Time  8    Period  Weeks    Status  Partially Met      PT LONG TERM GOAL #2   Title  Patient to be able to stand and walk for 60 minutes without increase in pain in order to show improvement in condition     Baseline  4/30- ongoing     Time  8    Period  Weeks    Status  On-going      PT LONG TERM GOAL #3   Title  Patient to be able to perform 5x sit to stand in 15 seconds or less and feet no wider apart than shoulders in order to show improved functional strength and mobility     Baseline  4/30- DNT due to increased pain at time of re-eval     Time  8    Period  Weeks    Status  On-going      PT LONG TERM GOAL #4   Title  Patient to show improved gait pattern to include reduced scissoring during gait, improved ankle stability, and equal  stride lengths/stance times in order to show overall improvement in efficiency of mobility     Baseline  4/30 improving, continues to need work with antalgic pain pattern     Time  8    Period  Weeks    Status  On-going            Plan - 08/04/17 1246    Clinical Impression Statement  Reviewed HEP and continued ROM and strength to progress toward goals. No increased pain with treatment today. Progressed table top holds and SLS to foam pad.     PT Next Visit Plan  continue with advanced functional core strengthening as tolerated, avoid QL stretching or massage as patient now refusing it ; assess tolerance to new HEP.      PT Home Exercise Plan  Eval: Frog stretch, HS stretch, piriformis stretch, lumbar rotations, hip flexor stretch, self massage to hip flexors QL stretches multiple positions, sit-stand, table top, SLS     Consulted and Agree with Plan of Care  Patient       Patient will benefit from skilled therapeutic intervention in order to improve the following deficits and impairments:  Abnormal gait, Increased fascial restricitons, Improper body mechanics, Pain, Decreased coordination, Decreased mobility, Increased muscle spasms, Postural dysfunction, Decreased strength, Difficulty walking, Impaired flexibility  Visit Diagnosis: Chronic bilateral low back pain without sciatica  Muscle weakness (generalized)  Stiffness of left hip, not elsewhere classified  Stiffness of right hip, not elsewhere classified  Abnormal posture     Problem List Patient Active Problem List   Diagnosis Date Noted  . Cholelithiasis 06/21/2011    Dorene Ar, PTA 08/04/2017, 1:28 PM  [facility] [address] [place], [place], [address] Phone: [phone]   Fax:  [phone]  Name: Wanda Mcintosh MRN: [id number] Date of Birth: 1965-12-26

## 2017-08-04 NOTE — Progress Notes (Signed)
Subjective:    Patient ID: Wanda Mcintosh, female    DOB: 13-Apr-1965, 52 y.o.   MRN: [id number]  HPI  52 y/o female with pmh of chronic pain syndrome, b/l SI joint pain, migraine, vertigo presents for follow up of low back pain.   Initially stated it started 12/2008, progressively getting worse after a fall.  Heat improves the pain.  Standing, prolonged walking.  Dull and intense.  Non-radiating.  Constant.  Denies associated weakness and numbness.  Had steroid injections in SI.  Lumbar ESIs and RFAs do not last.  Hydrocodone helps. Mechanical fall in November.  Pain limits cooking and cleaning. Pt current on disability.   Last visit 07/21/17. At that time, she had a knee injection.  She had trigger point injections prior to that. She notes good improvement with knee and back injections.  She states she has improved ROM and function.  She sees PT today.  She gets relief with Voltaren gel.   Pain Inventory Average Pain 4 Pain Right Now 3 My pain is intermittent, dull and aching  In the last 24 hours, has pain interfered with the following? General activity 5 Relation with others 2 Enjoyment of life 6 What TIME of day is your pain at its worst? morning  Sleep (in general) Poor  Pain is worse with: bending, inactivity, standing and some activites Pain improves with: rest, heat/ice and injections Relief from Meds: n/a  Mobility walk without assistance how many minutes can you walk? 15 ability to climb steps?  yes do you drive?  yes transfers alone Do you have any goals in this area?  yes  Function disabled: date disabled .  Neuro/Psych No problems in this area  Prior Studies Any changes since last visit?  no  Physicians involved in your care Any changes since last visit?  no   Family History  Problem Relation Age of Onset  . Hypertension Mother   . Breast cancer Mother   . Colon polyps Father   . Hypertension Father   . Diabetes Maternal Grandmother   . Diabetes  Paternal Grandmother   . Prostate cancer Paternal Grandfather   . Diabetes Maternal Aunt   . Irritable bowel syndrome Son   . Colon polyps Paternal Uncle   . Prostate cancer Paternal Uncle   . Colon polyps Paternal Uncle   . Colon cancer Neg Hx   . Esophageal cancer Neg Hx   . Stomach cancer Neg Hx    Social History   Socioeconomic History  . Marital status: Single    Spouse name: Not on file  . Number of children: 3  . Years of education: Not on file  . Highest education level: Not on file  Occupational History  . Occupation: disabled/Nurse  Social Needs  . Financial resource strain: Not on file  . Food insecurity:    Worry: Not on file    Inability: Not on file  . Transportation needs:    Medical: Not on file    Non-medical: Not on file  Tobacco Use  . Smoking status: Never Smoker  . Smokeless tobacco: Never Used  . Tobacco comment: smoked in 7yh grade, occ alcohol  Substance and Sexual Activity  . Alcohol use: Yes    Comment: occasional  . Drug use: No  . Sexual activity: Yes    Birth control/protection: Coitus interruptus  Lifestyle  . Physical activity:    Days per week: Not on file    Minutes per session: Not  on file  . Stress: Not on file  Relationships  . Social connections:    Talks on phone: Not on file    Gets together: Not on file    Attends religious service: Not on file    Active member of club or organization: Not on file    Attends meetings of clubs or organizations: Not on file    Relationship status: Not on file  Other Topics Concern  . Not on file  Social History Narrative  . Not on file   Past Surgical History:  Procedure Laterality Date  . BREAST SURGERY Bilateral    rt fibroid 90. lft 94  . CARPAL TUNNEL RELEASE Left   . CARPAL TUNNEL RELEASE Right 12/2016  . CHOLECYSTECTOMY  07/16/2011   Procedure: LAPAROSCOPIC CHOLECYSTECTOMY WITH INTRAOPERATIVE CHOLANGIOGRAM;  Surgeon: Merrie Roof, MD;  Location: [place];  Service: General;   Laterality: N/A;  . HAND SURGERY     cartilage  . LEEP    . TONSILLECTOMY     Past Medical History:  Diagnosis Date  . Anemia   . Back injury   . Barrett's esophagus   . 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 Chronic back pain   . Complication of anesthesia    possible aggression coming out of anesthesia  . Gallstones   . GERD (gastroesophageal reflux disease)   . Head ache   . Migraine   . Osteoarthritis   . Status post dilation of esophageal narrowing   . Vertigo    BP 114/75   Pulse 84   Ht 5\' 8"  (1.727 m) Comment: pt reported  Wt 263 lb 12.8 oz (119.7 kg)   SpO2 95%   BMI 40.11 kg/m   Opioid Risk Score:   Fall Risk Score:  `1  Depression screen PHQ 2/9  Depression screen [facility] 2/9 08/04/2017 07/07/2017 06/23/2016 05/20/2016 04/16/2016  Decreased Interest 0 0 0 0 0  Down, Depressed, Hopeless 0 0 0 0 0  PHQ - 2 Score 0 0 0 0 0  Altered sleeping - 0 - - 3  Tired, decreased energy - 0 - - 2  Change in appetite - 0 - - 0  Feeling bad or failure about yourself  - 0 - - 1  Trouble concentrating - 0 - - 0  Moving slowly or fidgety/restless - 0 - - 0  Suicidal thoughts - 0 - - 0  PHQ-9 Score - 0 - - 6  Difficult doing work/chores - Not difficult at all - - Somewhat difficult    Review of Systems  Constitutional: Positive for diaphoresis.  HENT: Negative.   Eyes: Negative.   Respiratory: Negative.   Cardiovascular: Negative.   Gastrointestinal: Negative.   Endocrine: Negative.   Genitourinary: Negative.   Musculoskeletal: Positive for arthralgias, back pain and gait problem.  Skin: Positive for rash.  Allergic/Immunologic: Negative.   Hematological: Negative.   Psychiatric/Behavioral: Negative.   All other systems reviewed and are negative.     Objective:   Physical Exam Gen: NAD. Vital signs reviewed. Obese HENT: Normocephalic, Atraumatic Eyes: EOMI, No discharge.  Cardio: RRR. No JVD. Pulm: B/l clear to auscultation.  Effort normal Abd: Nondistended,  BS+ MSK:  Gait slightly antalgic  No TTP back on knee  No edema.  Neuro:   Strength  5/5 in all LE myotomes Skin: Warm and Dry. Intact. Psych: Normal mood and behavior.     Assessment & Plan:  52 y/o female with pmh  of chronic pain syndrome, b/l SI joint pain, migraine, vertigo presents for follow up low back pain.  1. 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             MRI results reviewed 03/2015, showing lumbar facet arthropathy             Spinal anatomy reviewed with patient             Did not respond to ESIs and RFA at [place] does not receive benefit from TENS, tyelnol, Robaxin, Chiropracter             Cannot tolerate Mobic, Naproxen, Celebrex, Gabapentin, Baclofen             Lidoderm ointment, biofreeze, lidoderm patch OTC with minimal benefit             Cont heat   Cont pool therapy when finances allow   Cont PRN Diclofenac 75 BID    Cont Tizanidine 2mg  BID PRN              Cont Cymbalta to 60mg  daily              Cont aerobic exercise, states she does 2/week for 30 minutes             Pt had PT in 2012, pt to follow up today             Will consider Lyrica, but given hx of edema with other meds, will try to avoid                       Currently, pt states she feels the best she has felt in a long time, and would like not like to make any joints              2. Sleep disturbance             Pt stopped elavil             See #7  3. Peripheral neuropathy             Idiopathic             Cont lidoderm ointment  4. Morbid obesity             Pt saw dietitian              Cont to encourage weight loss, again             Discussed breast reduction surgery, pt working with PCP on weight loss, cont   5. B/l sacroiliitis, L>R             Good benefit from SI injections at [facility] bracing, which is helping. Encouraged limited use  6. Myalgias   Good benefit with trigger points in back on  4/25  7. RLS             Cont requip 0.25mg  qhs  8. Left knee pain   Due to OA and tendonitis  Good benefit from steroid injection on 5/2   Cont Voltaren gel

## 2017-08-09 ENCOUNTER — Ambulatory Visit: Payer: Medicare HMO | Admitting: Physical Therapy

## 2017-08-11 ENCOUNTER — Ambulatory Visit: Payer: Medicare HMO | Admitting: Physical Therapy

## 2017-08-12 ENCOUNTER — Other Ambulatory Visit: Payer: Self-pay | Admitting: Physical Medicine & Rehabilitation

## 2017-08-16 ENCOUNTER — Ambulatory Visit: Payer: Medicare HMO | Admitting: Physical Therapy

## 2017-08-16 ENCOUNTER — Telehealth: Payer: Self-pay | Admitting: Physical Therapy

## 2017-08-16 NOTE — Telephone Encounter (Signed)
No-show. Called and [facility] regarding time/date of next appointment.   Deniece Ree PT, DPT, CBIS  Supplemental Physical Therapist [facility]   Pager [phone]

## 2017-08-18 ENCOUNTER — Telehealth: Payer: Self-pay | Admitting: Physical Therapy

## 2017-08-18 ENCOUNTER — Ambulatory Visit: Payer: Medicare HMO | Admitting: Physical Therapy

## 2017-08-18 NOTE — Telephone Encounter (Signed)
Spoke to patient about missed appointment this morning. She reports she did leave a voicemail earlier this week which stated she could not attend today's appointment due to transportation. She plans to attend her next scheduled appointment.

## 2017-08-23 ENCOUNTER — Ambulatory Visit: Payer: Medicare HMO | Admitting: Physical Therapy

## 2017-08-24 ENCOUNTER — Telehealth: Payer: Self-pay | Admitting: Physical Medicine & Rehabilitation

## 2017-08-24 ENCOUNTER — Encounter: Payer: Medicare HMO | Attending: Physical Medicine & Rehabilitation | Admitting: Physical Medicine & Rehabilitation

## 2017-08-24 DIAGNOSIS — M545 Low back pain: Secondary | ICD-10-CM | POA: Insufficient documentation

## 2017-08-24 DIAGNOSIS — G479 Sleep disorder, unspecified: Secondary | ICD-10-CM | POA: Insufficient documentation

## 2017-08-24 DIAGNOSIS — M461 Sacroiliitis, not elsewhere classified: Secondary | ICD-10-CM | POA: Insufficient documentation

## 2017-08-24 DIAGNOSIS — M533 Sacrococcygeal disorders, not elsewhere classified: Secondary | ICD-10-CM | POA: Insufficient documentation

## 2017-08-24 DIAGNOSIS — G629 Polyneuropathy, unspecified: Secondary | ICD-10-CM | POA: Insufficient documentation

## 2017-08-24 DIAGNOSIS — R42 Dizziness and giddiness: Secondary | ICD-10-CM | POA: Insufficient documentation

## 2017-08-24 DIAGNOSIS — G43909 Migraine, unspecified, not intractable, without status migrainosus: Secondary | ICD-10-CM | POA: Insufficient documentation

## 2017-08-24 DIAGNOSIS — G894 Chronic pain syndrome: Secondary | ICD-10-CM | POA: Insufficient documentation

## 2017-08-24 NOTE — Telephone Encounter (Signed)
Patient called and states she did not sleep last night - fell asleep sometime midmorning and missed her appt.  She apologized.  I advised will be charge no show fee -

## 2017-09-02 ENCOUNTER — Encounter: Payer: Self-pay | Admitting: Physical Therapy

## 2017-09-02 NOTE — Therapy (Signed)
[place], [place], [address] Phone: [phone]   Fax:  [phone]  Patient Details  Name: Wanda Mcintosh MRN: [id number] Date of Birth: 01-15-1966 Referring Provider:  No ref. provider found  Encounter Date: 09/02/2017   PHYSICAL THERAPY DISCHARGE SUMMARY  Visits from Start of Care: 8  Current functional level related to goals / functional outcomes:  Patient has not returned since last visit- DC.    Remaining deficits: Unable to assess    Education / Equipment: N/A  Plan: Patient agrees to discharge.  Patient goals were not met. Patient is being discharged due to not returning since the last visit.  ?????        Deniece Ree PT, DPT, CBIS  Supplemental Physical Therapist [place]   Pager [facility] [address] [place], [place], [address] Phone: [phone]   Fax:  [phone]

## 2017-09-23 ENCOUNTER — Other Ambulatory Visit: Payer: Self-pay | Admitting: *Deleted

## 2017-09-23 MED ORDER — DICLOFENAC SODIUM 1 % TD GEL: 2.0000 g | Freq: Four times a day (QID) | TRANSDERMAL | 0 refills | Status: DC

## 2017-09-27 ENCOUNTER — Other Ambulatory Visit: Payer: Self-pay | Admitting: *Deleted

## 2017-09-27 MED ORDER — DICLOFENAC SODIUM 1 % TD GEL: 2.0000 g | Freq: Four times a day (QID) | TRANSDERMAL | 0 refills | Status: AC

## 2017-09-30 ENCOUNTER — Encounter: Payer: Medicare HMO | Attending: Physical Medicine & Rehabilitation | Admitting: Physical Medicine & Rehabilitation

## 2017-09-30 DIAGNOSIS — G629 Polyneuropathy, unspecified: Secondary | ICD-10-CM | POA: Insufficient documentation

## 2017-09-30 DIAGNOSIS — G479 Sleep disorder, unspecified: Secondary | ICD-10-CM | POA: Insufficient documentation

## 2017-09-30 DIAGNOSIS — M533 Sacrococcygeal disorders, not elsewhere classified: Secondary | ICD-10-CM | POA: Insufficient documentation

## 2017-09-30 DIAGNOSIS — M461 Sacroiliitis, not elsewhere classified: Secondary | ICD-10-CM | POA: Insufficient documentation

## 2017-09-30 DIAGNOSIS — G894 Chronic pain syndrome: Secondary | ICD-10-CM | POA: Insufficient documentation

## 2017-09-30 DIAGNOSIS — R42 Dizziness and giddiness: Secondary | ICD-10-CM | POA: Insufficient documentation

## 2017-09-30 DIAGNOSIS — M545 Low back pain: Secondary | ICD-10-CM | POA: Insufficient documentation

## 2017-09-30 DIAGNOSIS — G43909 Migraine, unspecified, not intractable, without status migrainosus: Secondary | ICD-10-CM | POA: Insufficient documentation

## 2017-10-25 ENCOUNTER — Encounter: Payer: Self-pay | Admitting: Gastroenterology

## 2017-10-25 ENCOUNTER — Ambulatory Visit (INDEPENDENT_AMBULATORY_CARE_PROVIDER_SITE_OTHER): Payer: Medicare HMO | Admitting: Gastroenterology

## 2017-10-25 VITALS — BP 120/84 | HR 68 | Ht 68.0 in | Wt 267.0 lb

## 2017-10-25 DIAGNOSIS — R1310 Dysphagia, unspecified: Secondary | ICD-10-CM | POA: Diagnosis not present

## 2017-10-25 DIAGNOSIS — R740 Nonspecific elevation of levels of transaminase and lactic acid dehydrogenase [LDH]: Secondary | ICD-10-CM

## 2017-10-25 DIAGNOSIS — K219 Gastro-esophageal reflux disease without esophagitis: Secondary | ICD-10-CM

## 2017-10-25 DIAGNOSIS — K602 Anal fissure, unspecified: Secondary | ICD-10-CM

## 2017-10-25 DIAGNOSIS — R1013 Epigastric pain: Secondary | ICD-10-CM | POA: Diagnosis not present

## 2017-10-25 DIAGNOSIS — R7401 Elevation of levels of liver transaminase levels: Secondary | ICD-10-CM

## 2017-10-25 MED ORDER — AMBULATORY NON FORMULARY MEDICATION: 0 refills | Status: AC

## 2017-10-25 NOTE — Patient Instructions (Addendum)
If you are age 51 or older, your body mass index should be between 23-30. Your Body mass index is 40.6 kg/m. If this is out of the aforementioned range listed, please consider follow up with your Primary Care Provider.  If you are age 40 or younger, your body mass index should be between 19-25. Your Body mass index is 40.6 kg/m. If this is out of the aformentioned range listed, please consider follow up with your Primary Care Provider.   We have sent a prescription for Diltiazem 2% gel to [facility]. You should apply a pea size amount to your rectum three times daily x 6-8 weeks.  [facility]'s information is below: Address: [address], [place], [place] [address]  [phone]  *Please DO NOT go directly from our office to pick up this medication! Give the pharmacy 1 day to process the prescription as this is compounded at takes time to make.  Continue taking Omeprazole.  Please purchase Citrucel over the counter and take daily.  Please call us if your symptoms persist.  Thank you for entrusting me with your care and for choosing [facility], Dr. Tivoli Cellar

## 2017-10-25 NOTE — Progress Notes (Signed)
HPI :  52 y/o female here for a follow up visit.   Since her last visit she underwent an upper endoscopy in April. This exam showed a normal esophagus, no evidence of Barrett's esophagus or esophageal stricture. We gave her a trial of increasing omeprazole 20 g twice daily, this appeared to help some of her reflux symptoms including her periodic dysphagia. She was on this for about a month and then tapered off, now back to omeprazole 20 mg once daily. She states her reflux symptoms are fairly well controlled. She does have some periodic dysphagia to certain foods such as rice, which happens perhaps once per week at most. Not bothering her too much at this time. She otherwise continues to have some periodic epigastric discomfort. She notes this mostly when she stands up from a sitting position, lasts for a short time and then resolves. Eating does not make it worse, in fact sometimes makes it better. She has questions about long-term reflux management and risks benefits of long-term PPI use.  I had previously treated her for an anal fissure which was noted on rectal exam a few visits ago. I prescribed her topical nitroglycerin ointment which she was not able to afford, this did not use. She is having 2-3 bowel movements per day, she does strain at times and occasionally passes hard stools. She takes an herbal laxative as needed which can help her. She does not use a daily fiber supplement.  Otherwise her liver function tests have significantly improved after she stopped diclofenac. ALT went from 130-40. We have been monitoring this. US liver 06/07/17 - fatty infiltration of the liver, s/p cholecystectomy  Endoscopic history: EGD 02/07/2014 - normal exam, biopsies taken of the GEJ which showed no evidence of Barrett's esophagus Colonoscopy 02/07/2014 - diverticulosis, otherwise normal colon. Biopsies show no evidence of microscopic colitis. EGD 07/01/2017 - normal esophagus, no Barrett's, mild gastritis  - HP negative   Past Medical History:  Diagnosis Date  . Anemia   . Back injury   . Barrett's esophagus   . 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 Chronic back pain   . Complication of anesthesia    possible aggression coming out of anesthesia  . Gallstones   . GERD (gastroesophageal reflux disease)   . Head ache   . Migraine   . Osteoarthritis   . Status post dilation of esophageal narrowing   . Vertigo      Past Surgical History:  Procedure Laterality Date  . BREAST SURGERY Bilateral    rt fibroid 90. lft 94  . CARPAL TUNNEL RELEASE Left   . CARPAL TUNNEL RELEASE Right 12/2016  . CHOLECYSTECTOMY  07/16/2011   Procedure: LAPAROSCOPIC CHOLECYSTECTOMY WITH INTRAOPERATIVE CHOLANGIOGRAM;  Surgeon: Merrie Roof, MD;  Location: [place];  Service: General;  Laterality: N/A;  . HAND SURGERY     cartilage  . LEEP    . TONSILLECTOMY     Family History  Problem Relation Age of Onset  . Hypertension Mother   . Breast cancer Mother   . Colon polyps Father   . Hypertension Father   . Diabetes Maternal Grandmother   . Diabetes Paternal Grandmother   . Prostate cancer Paternal Grandfather   . Diabetes Maternal Aunt   . Irritable bowel syndrome Son   . Colon polyps Paternal Uncle   . Prostate cancer Paternal Uncle   . Colon polyps Paternal Uncle   . Colon cancer Neg Hx   .  Esophageal cancer Neg Hx   . Stomach cancer Neg Hx    Social History   Tobacco Use  . Smoking status: Never Smoker  . Smokeless tobacco: Never Used  . Tobacco comment: smoked in 7yh grade, occ alcohol  Substance Use Topics  . Alcohol use: Yes    Comment: occasional  . Drug use: No   Current Outpatient Medications  Medication Sig Dispense Refill  . aspirin-acetaminophen-caffeine (EXCEDRIN MIGRAINE) 250-250-65 MG tablet Take by mouth every 6 (six) hours as needed for headache.    . b complex vitamins tablet Take 1 tablet by mouth daily.    . calcium-vitamin D 250-100 MG-UNIT tablet Take 1 tablet by  mouth daily.    . Cholecalciferol (VITAMIN D3) 5000 units TABS Take 1 tablet by mouth daily.    . diclofenac sodium (VOLTAREN) 1 % GEL Apply 2 g topically 4 (four) times daily. 900 g 0  . DULoxetine (CYMBALTA) 60 MG capsule TAKE 1 CAPSULE EVERY DAY 90 capsule 1  . estradiol (VIVELLE-DOT) 0.1 MG/24HR patch Place 1 patch onto the skin 2 (two) times a week.  12  . Lidocaine (LIDODERM EX) Apply 1 application topically as needed.    . magnesium oxide (MAG-OX) 400 MG tablet Take 400 mg by mouth daily.    . meclizine (ANTIVERT) 25 MG tablet Take 1 tablet by mouth as needed.    . Multiple Vitamins-Minerals (WOMENS MULTIVITAMIN PLUS) TABS Take 1 tablet by mouth daily.    Marland Kitchen omeprazole (PRILOSEC) 20 MG capsule Take 20 mg by mouth daily.    . potassium chloride (K-DUR,KLOR-CON) 10 MEQ tablet Take 10 mEq by mouth 2 (two) times daily.    . progesterone (PROMETRIUM) 100 MG capsule Take 100 mg by mouth daily.    Marland Kitchen rOPINIRole (REQUIP) 0.25 MG tablet Take 1 tablet by mouth as needed.    . SUMAtriptan (IMITREX) 100 MG tablet Take 100 mg by mouth daily as needed for migraine. May repeat in 2 hours if headache persists or recurs.    Marland Kitchen tiZANidine (ZANAFLEX) 4 MG tablet TAKE 1/2 TABLET TWO TIMES DAILY AS NEEDED FOR MUSCLE SPASMS 30 tablet 2  . vitamin C (ASCORBIC ACID) 500 MG tablet Take 1 tablet by mouth daily.     Current Facility-Administered Medications  Medication Dose Route Frequency Provider Last Rate Last Dose  . 0.9 %  sodium chloride infusion  500 mL Intravenous Once Laiah Pouncey, Carlota Raspberry, MD       Allergies  Allergen Reactions  . Baclofen Other (See Comments) and Swelling    Lower limb swelling  . Celebrex [Celecoxib] Other (See Comments)    Excessive bloating GI pain Paleness Excessive sleeping Mood change (aggression)  . Diclofenac Sodium Other (See Comments)    elevated liver enzymes  . Flexeril [Cyclobenzaprine] Other (See Comments)    Other reaction(s): Headache Headache  Headache   .  Gabapentin Swelling    Tears up stomach Feet swell  . Nsaids     Mobic tears up stomach.  . Ondansetron Hcl Other (See Comments)    Other reaction(s): Headache migraines  . Zofran Other (See Comments)    migraines     Review of Systems: All systems reviewed and negative except where noted in HPI.   Lab Results  Component Value Date   ALT 40 (H) 06/30/2017   AST 25 06/30/2017   ALKPHOS 74 06/30/2017   BILITOT 0.4 06/30/2017    Other recent labs in care everywhere  Physical Exam: BP 120/84  Pulse 68   Ht 5\' 8"  (1.727 m)   Wt 267 lb (121.1 kg)   BMI 40.60 kg/m  Constitutional: Pleasant,well-developed, female in no acute distress. HEENT: Normocephalic and atraumatic. Conjunctivae are normal. No scleral icterus. Neck supple.  Cardiovascular: Normal rate, regular rhythm.  Pulmonary/chest: Effort normal and breath sounds normal. No wheezing, rales or rhonchi. Abdominal: Soft, nondistended, protuberant. There are no masses palpable. No hepatomegaly. Extremities: no edema Lymphadenopathy: No cervical adenopathy noted. Neurological: Alert and oriented to person place and time. Skin: Skin is warm and dry. No rashes noted. Psychiatric: Normal mood and affect. Behavior is normal.   ASSESSMENT AND PLAN: 52 year old female here for reassessment of the following issues:  GERD / dysphagia - not much Heartburn or regurgitation on present regimen, however since tapering down omeprazole to lower dose she has had some recurrence of dysphagia. Her EGD did not show any obvious stenosis or stricture, no evidence of EOE. I offered her barium swallow or manometry to further evaluate dysphagia. She reports mild at this time and given recent EGD she's not too concerned about it, if symptoms worsen or persistover time she will contact me for reassessment and we will consider other workup. We otherwise discussed long-term risks and benefits of chronic PPI use. We do recommend lowest-dose dose  needed to control her symptoms, currently on omeprazole 20 mg once daily which she will continue. She does not feel that she can come off omeprazole completely due to worsening symptoms, she understands potential associated risks and wants to continue it for now.  Epigastric pain - this appears to be worse with standing which is the most reliable feature, otherwise some improvement with eating and with TUMS. This would be atypical for GERD, otherwise no evidence for h. Pylori or PUD. I suspect this more likely be related to abdominal wall type of pain given the positional nature of it. We discussed trials of capsaicin cream to see if this helps, and we discussed that weight loss perhaps could help this as well as her reflux symptoms which she agreed with. Follow up if symptoms persist.  Anal fissure - periodic symptoms from fissure which were noted on a prior exam. Recommend using fiber supplement daily, such as Citrucel. Otherwise she could not afford topical nitroglycerin ointment. We will give her a trial of topical diltiazem ointment to see if this helps, however not sure if this will be any cheaper for her (nitroglycerin was roughly $38). She should contact us if she is not able to get either of these.  Elevated ALT - baseline fatty liver, although I do feel that NSAID use with diclofenac and increased her ALT, with cessation her ALT significantly improved (130s to 40s). She is due to have this rechecked in October.  [place] Cellar, MD [facility]

## 2017-10-30 ENCOUNTER — Other Ambulatory Visit: Payer: Self-pay | Admitting: Physical Medicine & Rehabilitation

## 2017-11-25 ENCOUNTER — Other Ambulatory Visit: Payer: Self-pay | Admitting: Physical Medicine & Rehabilitation

## 2017-11-28 NOTE — Telephone Encounter (Signed)
Yes, please.

## 2017-12-01 ENCOUNTER — Telehealth: Payer: Self-pay | Admitting: *Deleted

## 2017-12-01 NOTE — Telephone Encounter (Signed)
Warning letter sent through my chart concerning frequent no shows.

## 2018-02-02 ENCOUNTER — Other Ambulatory Visit: Payer: Self-pay | Admitting: Physical Medicine & Rehabilitation

## 2018-03-06 ENCOUNTER — Other Ambulatory Visit: Payer: Self-pay | Admitting: Nurse Practitioner

## 2018-03-06 DIAGNOSIS — M545 Low back pain, unspecified: Secondary | ICD-10-CM

## 2018-03-10 ENCOUNTER — Other Ambulatory Visit: Payer: Self-pay | Admitting: Nurse Practitioner

## 2018-03-18 ENCOUNTER — Ambulatory Visit
Admission: RE | Admit: 2018-03-18 | Discharge: 2018-03-18 | Disposition: A | Discharge status: Home / Self Care | Payer: Medicare HMO | Source: Ambulatory Visit | Attending: Nurse Practitioner | Admitting: Nurse Practitioner

## 2018-03-18 DIAGNOSIS — M545 Low back pain, unspecified: Secondary | ICD-10-CM

## 2018-03-18 MED ORDER — GADOBENATE DIMEGLUMINE 529 MG/ML IV SOLN
20.0000 mL | Freq: Once | INTRAVENOUS | Status: AC | PRN
  Administered 2018-03-18: 20 mL via INTRAVENOUS

## 2018-05-04 ENCOUNTER — Inpatient Hospital Stay (HOSPITAL_COMMUNITY)
Admission: EM | Admit: 2018-05-04 | Discharge: 2018-05-07 | DRG: 193 | Disposition: A | Discharge status: Home / Self Care | Payer: Medicare HMO | Attending: Internal Medicine | Admitting: Internal Medicine

## 2018-05-04 ENCOUNTER — Other Ambulatory Visit: Payer: Self-pay

## 2018-05-04 ENCOUNTER — Encounter (HOSPITAL_COMMUNITY): Payer: Self-pay

## 2018-05-04 ENCOUNTER — Emergency Department (HOSPITAL_COMMUNITY): Payer: Medicare HMO

## 2018-05-04 DIAGNOSIS — Z888 Allergy status to other drugs, medicaments and biological substances status: Secondary | ICD-10-CM

## 2018-05-04 DIAGNOSIS — Z833 Family history of diabetes mellitus: Secondary | ICD-10-CM

## 2018-05-04 DIAGNOSIS — R739 Hyperglycemia, unspecified: Secondary | ICD-10-CM | POA: Diagnosis present

## 2018-05-04 DIAGNOSIS — E876 Hypokalemia: Secondary | ICD-10-CM | POA: Diagnosis present

## 2018-05-04 DIAGNOSIS — G8929 Other chronic pain: Secondary | ICD-10-CM | POA: Diagnosis present

## 2018-05-04 DIAGNOSIS — Z886 Allergy status to analgesic agent status: Secondary | ICD-10-CM

## 2018-05-04 DIAGNOSIS — Z881 Allergy status to other antibiotic agents status: Secondary | ICD-10-CM

## 2018-05-04 DIAGNOSIS — G43909 Migraine, unspecified, not intractable, without status migrainosus: Secondary | ICD-10-CM | POA: Diagnosis present

## 2018-05-04 DIAGNOSIS — Z8249 Family history of ischemic heart disease and other diseases of the circulatory system: Secondary | ICD-10-CM

## 2018-05-04 DIAGNOSIS — J101 Influenza due to other identified influenza virus with other respiratory manifestations: Secondary | ICD-10-CM | POA: Diagnosis present

## 2018-05-04 DIAGNOSIS — K219 Gastro-esophageal reflux disease without esophagitis: Secondary | ICD-10-CM | POA: Diagnosis present

## 2018-05-04 DIAGNOSIS — R748 Abnormal levels of other serum enzymes: Secondary | ICD-10-CM | POA: Diagnosis present

## 2018-05-04 DIAGNOSIS — M549 Dorsalgia, unspecified: Secondary | ICD-10-CM | POA: Diagnosis present

## 2018-05-04 DIAGNOSIS — J9601 Acute respiratory failure with hypoxia: Secondary | ICD-10-CM | POA: Diagnosis present

## 2018-05-04 DIAGNOSIS — J209 Acute bronchitis, unspecified: Secondary | ICD-10-CM | POA: Diagnosis present

## 2018-05-04 DIAGNOSIS — Z803 Family history of malignant neoplasm of breast: Secondary | ICD-10-CM

## 2018-05-04 DIAGNOSIS — R9431 Abnormal electrocardiogram [ECG] [EKG]: Secondary | ICD-10-CM | POA: Diagnosis present

## 2018-05-04 DIAGNOSIS — J1000 Influenza due to other identified influenza virus with unspecified type of pneumonia: Secondary | ICD-10-CM | POA: Diagnosis not present

## 2018-05-04 DIAGNOSIS — M1990 Unspecified osteoarthritis, unspecified site: Secondary | ICD-10-CM | POA: Diagnosis present

## 2018-05-04 DIAGNOSIS — J111 Influenza due to unidentified influenza virus with other respiratory manifestations: Secondary | ICD-10-CM | POA: Diagnosis not present

## 2018-05-04 LAB — I-STAT BETA HCG BLOOD, ED (MC, WL, AP ONLY): I-stat hCG, quantitative: <5 m[IU]/mL (ref <5)

## 2018-05-04 LAB — CBC WITH DIFFERENTIAL/PLATELET
Abs Immature Granulocytes: 0.02 10*3/uL (ref 0.00–0.07)
Basophils Absolute: 0 10*3/uL (ref 0.0–0.1)
Basophils Relative: 0 %
EOS ABS: 0 10*3/uL (ref 0.0–0.5)
Eosinophils Relative: 1 %
HCT: 46.4 % — ABNORMAL HIGH (ref 36.0–46.0)
Hemoglobin: 15 g/dL (ref 12.0–15.0)
IMMATURE GRANULOCYTES: 1 %
Lymphocytes Relative: 27 %
Lymphs Abs: 1.2 10*3/uL (ref 0.7–4.0)
MCH: 29.3 pg (ref 26.0–34.0)
MCHC: 32.3 g/dL (ref 30.0–36.0)
MCV: 90.6 fL (ref 80.0–100.0)
Monocytes Absolute: 0.3 10*3/uL (ref 0.1–1.0)
Monocytes Relative: 7 %
Neutro Abs: 2.8 10*3/uL (ref 1.7–7.7)
Neutrophils Relative %: 64 %
Platelets: 162 10*3/uL (ref 150–400)
RBC: 5.12 MIL/uL — ABNORMAL HIGH (ref 3.87–5.11)
RDW: 12.1 % (ref 11.5–15.5)
WBC: 4.2 10*3/uL (ref 4.0–10.5)
nRBC: 0 % (ref 0.0–0.2)

## 2018-05-04 LAB — COMPREHENSIVE METABOLIC PANEL
ALT: 47 U/L — ABNORMAL HIGH (ref 0–44)
AST: 40 U/L (ref 15–41)
Albumin: 3.5 g/dL (ref 3.5–5.0)
Alkaline Phosphatase: 53 U/L (ref 38–126)
Anion gap: 7 (ref 5–15)
BUN: 15 mg/dL (ref 6–20)
CO2: 28 mmol/L (ref 22–32)
Calcium: 9 mg/dL (ref 8.9–10.3)
Chloride: 103 mmol/L (ref 98–111)
Creatinine, Ser: 0.92 mg/dL (ref 0.44–1.00)
GFR calc Af Amer: >60 mL/min (ref >60)
GFR calc non Af Amer: >60 mL/min (ref >60)
Glucose, Bld: 110 mg/dL — ABNORMAL HIGH (ref 70–99)
Potassium: 3.8 mmol/L (ref 3.5–5.1)
SODIUM: 138 mmol/L (ref 135–145)
Total Bilirubin: 0.7 mg/dL (ref 0.3–1.2)
Total Protein: 6.6 g/dL (ref 6.5–8.1)

## 2018-05-04 LAB — INFLUENZA PANEL BY PCR (TYPE A & B)
INFLAPCR: POSITIVE — AB
Influenza B By PCR: NEGATIVE

## 2018-05-04 MED ORDER — IPRATROPIUM-ALBUTEROL 0.5-2.5 (3) MG/3ML IN SOLN
3.0000 mL | Freq: Once | RESPIRATORY_TRACT | Status: AC
  Administered 2018-05-04: 3 mL via RESPIRATORY_TRACT
  Filled 2018-05-04: qty 3

## 2018-05-04 MED ORDER — ALBUTEROL SULFATE HFA 108 (90 BASE) MCG/ACT IN AERS
2.0000 | INHALATION_SPRAY | Freq: Once | RESPIRATORY_TRACT | Status: AC
  Administered 2018-05-04: 2 via RESPIRATORY_TRACT
  Filled 2018-05-04: qty 6.7

## 2018-05-04 MED ORDER — SODIUM CHLORIDE 0.9 % IV SOLN
1.0000 g | Freq: Once | INTRAVENOUS | Status: AC
  Administered 2018-05-04: 1 g via INTRAVENOUS
  Filled 2018-05-04: qty 10

## 2018-05-04 MED ORDER — METHYLPREDNISOLONE SODIUM SUCC 125 MG IJ SOLR
125.0000 mg | Freq: Once | INTRAMUSCULAR | Status: AC
  Administered 2018-05-04: 125 mg via INTRAVENOUS
  Filled 2018-05-04: qty 2

## 2018-05-04 NOTE — ED Triage Notes (Signed)
Pt endorses flying to [place] 2/8 and thought she was having allergies starting on 2/11. Pt endorses shob with productive cough with clear/white/yellow frothy sputum. VSS. Pt has mask on.

## 2018-05-04 NOTE — ED Provider Notes (Signed)
[facility] EMERGENCY DEPARTMENT Provider Note   CSN: [id number] Arrival date & time: 05/04/18  1428     History   Chief Complaint No chief complaint on file.   HPI Wanda Mcintosh is a 53 y.o. female.   Cough  Cough characteristics:  Productive Sputum characteristics:  Frothy Severity:  Moderate Onset quality:  Gradual Duration:  2 days Timing:  Constant Progression:  Worsening Chronicity:  New Smoker: no   Context comment:  Recent travel Relieved by:  None tried Worsened by:  Nothing Ineffective treatments:  None tried Associated symptoms: fever   Associated symptoms: no chest pain, no chills, no ear pain, no rash, no shortness of breath and no sore throat     Past Medical History:  Diagnosis Date  . Anemia   . Back injury   . Barrett's esophagus   . Cancer ([place])    cervica-l leep  . Chronic back pain   . Complication of anesthesia    possible aggression coming out of anesthesia  . Gallstones   . GERD (gastroesophageal reflux disease)   . Head ache   . Migraine   . Osteoarthritis   . Status post dilation of esophageal narrowing   . Vertigo     Patient Active Problem List   Diagnosis Date Noted  . Influenza A 05/05/2018  . Cholelithiasis 06/21/2011    Past Surgical History:  Procedure Laterality Date  . BREAST SURGERY Bilateral    rt fibroid 90. lft 94  . CARPAL TUNNEL RELEASE Left   . CARPAL TUNNEL RELEASE Right 12/2016  . CHOLECYSTECTOMY  07/16/2011   Procedure: LAPAROSCOPIC CHOLECYSTECTOMY WITH INTRAOPERATIVE CHOLANGIOGRAM;  Surgeon: Merrie Roof, MD;  Location: [facility];  Service: General;  Laterality: N/A;  . HAND SURGERY     cartilage  . LEEP    . TONSILLECTOMY       OB History    Gravida  4   Para  3   Term      Preterm      AB  1   Living        SAB  1   TAB      Ectopic      Multiple      Live Births               Home Medications    Prior to Admission medications   Medication Sig  Start Date End Date Taking? Authorizing Provider  cyclobenzaprine (FLEXERIL) 10 MG tablet Take 10 mg by mouth 2 (two) times daily. 04/21/18  Yes [provider]  DULoxetine (CYMBALTA) 60 MG capsule TAKE 1 CAPSULE EVERY DAY Patient taking differently: Take 60 mg by mouth daily.  08/01/17  Yes Patel, Domenick Bookbinder, MD  estradiol (VIVELLE-DOT) 0.1 MG/24HR patch Place 1 patch onto the skin 2 (two) times a week. 05/30/17  Yes [provider]  HYDROcodone-acetaminophen (NORCO) 7.5-325 MG tablet Take 1 tablet by mouth 3 (three) times daily as needed for moderate pain.  04/24/18  Yes [provider]  omeprazole (PRILOSEC) 20 MG capsule Take 20 mg by mouth daily.   Yes [provider]  AMBULATORY NON FORMULARY MEDICATION Medication Name: Diltiazem 2% gel with 5% Lidocaine Apply a pea sized amount internally four times daily. Dispense 30 GM zero refill Patient not taking: Reported on 05/04/2018 10/25/17   Yetta Flock, MD  aspirin-acetaminophen-caffeine (EXCEDRIN MIGRAINE) [phone] MG tablet Take by mouth every 6 (six) hours as needed for headache.  [provider]  diclofenac sodium (VOLTAREN) 1 % GEL Apply 2 g topically 4 (four) times daily. Patient not taking: Reported on 05/04/2018 09/27/17   Jamse Arn, MD  tiZANidine (ZANAFLEX) 4 MG tablet TAKE 1/2 TABLET TWICE DAILY AS NEEDED FOR MUSCLE SPASM(S) Patient not taking: Reported on 05/04/2018 11/28/17   Jamse Arn, MD    Family History Family History  Problem Relation Age of Onset  . Hypertension Mother   . Breast cancer Mother   . Colon polyps Father   . Hypertension Father   . Diabetes Maternal Grandmother   . Diabetes Paternal Grandmother   . Prostate cancer Paternal Grandfather   . Diabetes Maternal Aunt   . Irritable bowel syndrome Son   . Colon polyps Paternal Uncle   . Prostate cancer Paternal Uncle   . Colon polyps Paternal Uncle   . Colon cancer Neg Hx   . Esophageal cancer Neg  Hx   . Stomach cancer Neg Hx     Social History Social History   Tobacco Use  . Smoking status: Never Smoker  . Smokeless tobacco: Never Used  . Tobacco comment: smoked in 7yh grade, occ alcohol  Substance Use Topics  . Alcohol use: Not Currently    Comment: occasional  . Drug use: No     Allergies   Baclofen; Celebrex [celecoxib]; Diclofenac sodium; Flexeril [cyclobenzaprine]; Gabapentin; Nsaids; Ondansetron hcl; and Zofran   Review of Systems Review of Systems  Constitutional: Positive for fever. Negative for chills.  HENT: Negative for ear pain and sore throat.   Eyes: Negative for pain and visual disturbance.  Respiratory: Positive for cough. Negative for shortness of breath.   Cardiovascular: Negative for chest pain and palpitations.  Gastrointestinal: Negative for abdominal pain and vomiting.  Genitourinary: Negative for dysuria and hematuria.  Musculoskeletal: Negative for arthralgias and back pain.  Skin: Negative for color change and rash.  Neurological: Negative for seizures and syncope.  All other systems reviewed and are negative.    Physical Exam Updated Vital Signs BP 118/69   Pulse 92   Temp 99.5 F (37.5 C) (Oral)   Resp 20   SpO2 91%   Physical Exam Vitals signs and nursing note reviewed.  Constitutional:      General: She is not in acute distress.    Appearance: She is well-developed.  HENT:     Head: Normocephalic and atraumatic.  Eyes:     Conjunctiva/sclera: Conjunctivae normal.  Neck:     Musculoskeletal: Neck supple.  Cardiovascular:     Rate and Rhythm: Normal rate and regular rhythm.     Heart sounds: No murmur.  Pulmonary:     Effort: Pulmonary effort is normal. No respiratory distress.     Breath sounds: Rhonchi present.  Abdominal:     Palpations: Abdomen is soft.     Tenderness: There is no abdominal tenderness.  Skin:    General: Skin is warm and dry.  Neurological:     General: No focal deficit present.     Mental  Status: She is alert and oriented to person, place, and time. Mental status is at baseline.      ED Treatments / Results  Labs (all labs ordered are listed, but only abnormal results are displayed) Labs Reviewed  COMPREHENSIVE METABOLIC PANEL - Abnormal; Notable for the following components:      Result Value   Glucose, Bld 110 (*)    ALT 47 (*)    All other components within  normal limits  CBC WITH DIFFERENTIAL/PLATELET - Abnormal; Notable for the following components:   RBC 5.12 (*)    HCT 46.4 (*)    All other components within normal limits  INFLUENZA PANEL BY PCR (TYPE A & B) - Abnormal; Notable for the following components:   Influenza A By PCR POSITIVE (*)    All other components within normal limits  I-STAT BETA HCG BLOOD, ED ([facility], [facility], [facility] ONLY)    EKG EKG Interpretation  Date/Time:  Thursday May 04 2018 14:34:12 EST Ventricular Rate:  81 PR Interval:  152 QRS Duration: 74 QT Interval:  348 QTC Calculation: 404 R Axis:   114 Text Interpretation:  Normal sinus rhythm Right axis deviation Low voltage QRS Cannot rule out Anterior infarct , age undetermined Abnormal ECG new inferior T wave inversions and T wave flattening in precordial leads compared to previous tracing from 2014 Confirmed by Theotis Burrow [phone]) on 05/04/2018 5:57:45 PM   Radiology Dg Chest 2 View  Result Date: 05/04/2018 CLINICAL DATA:  Recent flight to [place] 04/29/2018 with subsequent shortness of breath, fever and chills with productive cough. EXAM: CHEST - 2 VIEW COMPARISON:  None. FINDINGS: Lungs are somewhat hypoinflated with mild patchy perihilar opacification and prominence of the perihilar markings. No effusion. Cardiomediastinal silhouette and remainder of the exam is unremarkable. IMPRESSION: Mild prominence of the perihilar markings with subtle patchy perihilar density. Findings may be due to mild vascular congestion, acute bronchitic process or perihilar pneumonia. Electronically  Signed   By: Marin Olp M.D.   On: 05/04/2018 16:06    Procedures Procedures (including critical care time)  Medications Ordered in ED Medications  oseltamivir (TAMIFLU) capsule 75 mg (has no administration in time range)  cefTRIAXone (ROCEPHIN) 1 g in sodium chloride 0.9 % 100 mL IVPB (0 g Intravenous Stopped 05/04/18 2020)  albuterol (PROVENTIL HFA;VENTOLIN HFA) 108 (90 Base) MCG/ACT inhaler 2 puff (2 puffs Inhalation Given 05/04/18 2100)  ipratropium-albuterol (DUONEB) 0.5-2.5 (3) MG/3ML nebulizer solution 3 mL (3 mLs Nebulization Given 05/04/18 2116)  methylPREDNISolone sodium succinate (SOLU-MEDROL) 125 mg/2 mL injection 125 mg (125 mg Intravenous Given 05/04/18 2120)  ipratropium-albuterol (DUONEB) 0.5-2.5 (3) MG/3ML nebulizer solution 3 mL (3 mLs Nebulization Given 05/04/18 2332)     Initial Impression / Assessment and Plan / ED Course  I have reviewed the triage vital signs and the nursing notes.  Pertinent labs & imaging results that were available during my care of the patient were reviewed by me and considered in my medical decision making (see chart for details).     MDM  53 year old female significant history listed above, no pulmonary illnesses who presents with cough, fever after traveling to [place] coming back on the eighth.  Patient indicates that she might of had sick contacts on a plane however denies any environmental exposures, mosquito bites.  Patient denies any chest pain, peripheral leg swelling, plane flight was only 2-1/2 hours.  Denies DVT, PE history.  Likely community-acquired pneumonia however will also test for flu.  Physical exam positive for rales on lung exam, no peripheral edema, no rash or other significant findings.  Doubt dengue, malaria based on symptoms and progression.  We will discussed with patient regarding these etiologies and signs to watch out for due to travel to [place].  CDC website reviewed.  Patient laboratory studies indicate flu  positive, other laboratory studies involve baseline.  Patient desat into the high 70s, low 80s upon coughing fits.  Patient given duo nebs  here in the emergency department with some improvement of her respiratory status, satting 91, 92% on room air will place on 1 L of oxygen to assist with respirations.  Chest x-ray shows possible pneumonia; however likely reactionary due to the patient's flu.  Will give Rocephin to ensure coverage.  Discussed with admission with medicine team due to increased work of breathing, duo nebs, oxygen.  Inpatient team in agreement with this plan.  Patient medically stable condition with stable vital signs.  The above care was discussed and agreed upon by my attending physician.  Final Clinical Impressions(s) / ED Diagnoses   Final diagnoses:  Influenza    ED Discharge Orders    None       Orson Aloe, MD 05/05/18 0022    Rex Kras Wenda Overland, MD 05/07/18 [phone]

## 2018-05-05 ENCOUNTER — Encounter (HOSPITAL_COMMUNITY): Payer: Self-pay | Admitting: Internal Medicine

## 2018-05-05 DIAGNOSIS — G8929 Other chronic pain: Secondary | ICD-10-CM | POA: Diagnosis present

## 2018-05-05 DIAGNOSIS — R748 Abnormal levels of other serum enzymes: Secondary | ICD-10-CM | POA: Diagnosis present

## 2018-05-05 DIAGNOSIS — M549 Dorsalgia, unspecified: Secondary | ICD-10-CM | POA: Diagnosis present

## 2018-05-05 DIAGNOSIS — J209 Acute bronchitis, unspecified: Secondary | ICD-10-CM | POA: Diagnosis present

## 2018-05-05 DIAGNOSIS — J101 Influenza due to other identified influenza virus with other respiratory manifestations: Secondary | ICD-10-CM

## 2018-05-05 DIAGNOSIS — R739 Hyperglycemia, unspecified: Secondary | ICD-10-CM | POA: Diagnosis not present

## 2018-05-05 DIAGNOSIS — Z881 Allergy status to other antibiotic agents status: Secondary | ICD-10-CM | POA: Diagnosis not present

## 2018-05-05 DIAGNOSIS — J111 Influenza due to unidentified influenza virus with other respiratory manifestations: Secondary | ICD-10-CM | POA: Diagnosis present

## 2018-05-05 DIAGNOSIS — E876 Hypokalemia: Secondary | ICD-10-CM | POA: Diagnosis present

## 2018-05-05 DIAGNOSIS — Z833 Family history of diabetes mellitus: Secondary | ICD-10-CM | POA: Diagnosis not present

## 2018-05-05 DIAGNOSIS — J1000 Influenza due to other identified influenza virus with unspecified type of pneumonia: Secondary | ICD-10-CM | POA: Diagnosis present

## 2018-05-05 DIAGNOSIS — J9601 Acute respiratory failure with hypoxia: Secondary | ICD-10-CM | POA: Diagnosis present

## 2018-05-05 DIAGNOSIS — R0600 Dyspnea, unspecified: Secondary | ICD-10-CM | POA: Diagnosis not present

## 2018-05-05 DIAGNOSIS — M1990 Unspecified osteoarthritis, unspecified site: Secondary | ICD-10-CM | POA: Diagnosis present

## 2018-05-05 DIAGNOSIS — R9431 Abnormal electrocardiogram [ECG] [EKG]: Secondary | ICD-10-CM | POA: Diagnosis present

## 2018-05-05 DIAGNOSIS — G43909 Migraine, unspecified, not intractable, without status migrainosus: Secondary | ICD-10-CM | POA: Diagnosis present

## 2018-05-05 DIAGNOSIS — K219 Gastro-esophageal reflux disease without esophagitis: Secondary | ICD-10-CM | POA: Diagnosis present

## 2018-05-05 DIAGNOSIS — Z8249 Family history of ischemic heart disease and other diseases of the circulatory system: Secondary | ICD-10-CM | POA: Diagnosis not present

## 2018-05-05 DIAGNOSIS — R05 Cough: Secondary | ICD-10-CM | POA: Diagnosis not present

## 2018-05-05 DIAGNOSIS — Z888 Allergy status to other drugs, medicaments and biological substances status: Secondary | ICD-10-CM | POA: Diagnosis not present

## 2018-05-05 DIAGNOSIS — Z803 Family history of malignant neoplasm of breast: Secondary | ICD-10-CM | POA: Diagnosis not present

## 2018-05-05 DIAGNOSIS — Z886 Allergy status to analgesic agent status: Secondary | ICD-10-CM | POA: Diagnosis not present

## 2018-05-05 LAB — BASIC METABOLIC PANEL
Anion gap: 11 (ref 5–15)
BUN: 12 mg/dL (ref 6–20)
CO2: 25 mmol/L (ref 22–32)
Calcium: 8.4 mg/dL — ABNORMAL LOW (ref 8.9–10.3)
Chloride: 102 mmol/L (ref 98–111)
Creatinine, Ser: 0.97 mg/dL (ref 0.44–1.00)
GFR calc Af Amer: >60 mL/min (ref >60)
GLUCOSE: 324 mg/dL — AB (ref 70–99)
Potassium: 3 mmol/L — ABNORMAL LOW (ref 3.5–5.1)
Sodium: 138 mmol/L (ref 135–145)

## 2018-05-05 LAB — CBC
HCT: 43 % (ref 36.0–46.0)
Hemoglobin: 14.4 g/dL (ref 12.0–15.0)
MCH: 29.9 pg (ref 26.0–34.0)
MCHC: 33.5 g/dL (ref 30.0–36.0)
MCV: 89.4 fL (ref 80.0–100.0)
Platelets: 147 10*3/uL — ABNORMAL LOW (ref 150–400)
RBC: 4.81 MIL/uL (ref 3.87–5.11)
RDW: 12 % (ref 11.5–15.5)
WBC: 4.7 10*3/uL (ref 4.0–10.5)
nRBC: 0 % (ref 0.0–0.2)

## 2018-05-05 LAB — GLUCOSE, CAPILLARY
Glucose-Capillary: 211 mg/dL — ABNORMAL HIGH (ref 70–99)
Glucose-Capillary: 192 mg/dL — ABNORMAL HIGH (ref 70–99)

## 2018-05-05 LAB — TROPONIN I: Troponin I: <0.03 ng/mL (ref <0.03)

## 2018-05-05 LAB — HIV ANTIBODY (ROUTINE TESTING W REFLEX): HIV Screen 4th Generation wRfx: NONREACTIVE

## 2018-05-05 MED ORDER — OSELTAMIVIR PHOSPHATE 75 MG PO CAPS
75.0000 mg | ORAL_CAPSULE | Freq: Two times a day (BID) | ORAL | Status: DC
  Administered 2018-05-05 – 2018-05-07 (×6): 75 mg via ORAL
  Filled 2018-05-05 (×7): qty 1

## 2018-05-05 MED ORDER — IPRATROPIUM-ALBUTEROL 0.5-2.5 (3) MG/3ML IN SOLN
3.0000 mL | RESPIRATORY_TRACT | Status: AC
  Administered 2018-05-05: 3 mL via RESPIRATORY_TRACT
  Filled 2018-05-05: qty 3

## 2018-05-05 MED ORDER — PROMETHAZINE HCL 25 MG PO TABS: 12.5000 mg | ORAL_TABLET | Freq: Four times a day (QID) | ORAL | Status: DC | PRN

## 2018-05-05 MED ORDER — SODIUM CHLORIDE 0.9 % IV SOLN
100.0000 mg | Freq: Two times a day (BID) | INTRAVENOUS | Status: DC
  Administered 2018-05-05 – 2018-05-06 (×3): 100 mg via INTRAVENOUS
  Filled 2018-05-05 (×5): qty 100

## 2018-05-05 MED ORDER — INSULIN ASPART 100 UNIT/ML ~~LOC~~ SOLN: 0.0000 [IU] | Freq: Every day | SUBCUTANEOUS | Status: DC

## 2018-05-05 MED ORDER — METHYLPREDNISOLONE SODIUM SUCC 125 MG IJ SOLR: 60.0000 mg | Freq: Two times a day (BID) | INTRAMUSCULAR | Status: DC

## 2018-05-05 MED ORDER — OXYCODONE HCL 5 MG PO TABS: 5.0000 mg | ORAL_TABLET | ORAL | Status: DC | PRN

## 2018-05-05 MED ORDER — BENZONATATE 100 MG PO CAPS
200.0000 mg | ORAL_CAPSULE | Freq: Three times a day (TID) | ORAL | Status: DC | PRN
  Administered 2018-05-06: 200 mg via ORAL
  Filled 2018-05-05: qty 2

## 2018-05-05 MED ORDER — INSULIN ASPART 100 UNIT/ML ~~LOC~~ SOLN
0.0000 [IU] | Freq: Three times a day (TID) | SUBCUTANEOUS | Status: DC
  Administered 2018-05-05 – 2018-05-06 (×2): 7 [IU] via SUBCUTANEOUS
  Administered 2018-05-06 (×2): 4 [IU] via SUBCUTANEOUS
  Administered 2018-05-07: 3 [IU] via SUBCUTANEOUS

## 2018-05-05 MED ORDER — POTASSIUM CHLORIDE CRYS ER 20 MEQ PO TBCR
40.0000 meq | EXTENDED_RELEASE_TABLET | ORAL | Status: AC
  Administered 2018-05-05 (×2): 40 meq via ORAL
  Filled 2018-05-05 (×2): qty 2

## 2018-05-05 MED ORDER — ENOXAPARIN SODIUM 40 MG/0.4ML ~~LOC~~ SOLN
40.0000 mg | SUBCUTANEOUS | Status: DC
  Administered 2018-05-05 – 2018-05-07 (×3): 40 mg via SUBCUTANEOUS
  Filled 2018-05-05 (×3): qty 0.4

## 2018-05-05 MED ORDER — METHYLPREDNISOLONE SODIUM SUCC 125 MG IJ SOLR
60.0000 mg | Freq: Four times a day (QID) | INTRAMUSCULAR | Status: DC
  Administered 2018-05-05 – 2018-05-06 (×4): 60 mg via INTRAVENOUS
  Filled 2018-05-05 (×4): qty 2

## 2018-05-05 MED ORDER — DM-GUAIFENESIN ER 30-600 MG PO TB12
1.0000 | ORAL_TABLET | Freq: Two times a day (BID) | ORAL | Status: DC
  Administered 2018-05-05 – 2018-05-07 (×5): 1 via ORAL
  Filled 2018-05-05 (×5): qty 1

## 2018-05-05 MED ORDER — DULOXETINE HCL 60 MG PO CPEP
60.0000 mg | ORAL_CAPSULE | Freq: Every day | ORAL | Status: DC
  Administered 2018-05-05 – 2018-05-06 (×2): 60 mg via ORAL
  Filled 2018-05-05 (×2): qty 1

## 2018-05-05 MED ORDER — IPRATROPIUM-ALBUTEROL 0.5-2.5 (3) MG/3ML IN SOLN: 3.0000 mL | RESPIRATORY_TRACT | Status: DC | PRN

## 2018-05-05 MED ORDER — TRAZODONE HCL 50 MG PO TABS
50.0000 mg | ORAL_TABLET | Freq: Every evening | ORAL | Status: DC | PRN
  Administered 2018-05-06: 50 mg via ORAL
  Filled 2018-05-05: qty 1

## 2018-05-05 NOTE — Progress Notes (Signed)
Attempted to call report X2 but patient has not been approved yet by charge nurse. I was told that the unit will call when ready to receive the patient.

## 2018-05-05 NOTE — H&P (Signed)
History and Physical    Wanda Mcintosh SEG:[id number] DOB: Nov 19, 1965 DOA: 05/04/2018  PCP: Bartholome Bill, MD Patient coming from: Home  Chief Complaint: Shortness of breath and cough  HPI: Wanda Mcintosh is a 53 y.o. female with no significant medical history presenting with shortness of breath and cough.  Recently went to [place] return on February 8. Started having shortness of breath and cough since then. Thought it was her allergies acting out and tried to manage with allergy medication.  She returned from [place] and her symptoms got worse 2 days ago.  Reports coughing until she gags.  Cough is mainly dry.  Also endorses some fever and mild headache.  Denies myalgia.  Denies chest pain, orthopnea, leg swelling or palpitation.  Denies nausea, vomiting, abdominal pain or dysuria. Denies history of asthma, COPD, cardiac disease, kidney disease or diabetes.  She has not had a flu shot this year.  Denies smoking cigarettes.  Drinks alcohol socially.  Denies recreational drug use.  In ED, vital signs not impressive.  However, patient had a desaturation to mid 70s per EDP that has not been documented.  DC, CMP, chest x-ray and EKG not impressive.  Influenza A+.  Started on Solu-Medrol, DuoNeb and Tamiflu.  Hospitalist service was called for admission.   Review of Systems  Constitutional: Positive for fever. Negative for chills and weight loss.  HENT: Negative for congestion and sore throat.   Eyes: Negative for blurred vision and photophobia.  Respiratory: Positive for cough. Negative for hemoptysis, sputum production, shortness of breath and wheezing.   Cardiovascular: Positive for chest pain. Negative for palpitations, orthopnea and leg swelling.       Chest pain with cough.  Gastrointestinal: Negative for abdominal pain, blood in stool, diarrhea, melena, nausea and vomiting.  Genitourinary: Negative for dysuria and hematuria.  Musculoskeletal: Negative for myalgias.    Skin: Negative for rash.  Neurological: Positive for headaches. Negative for sensory change, speech change and focal weakness.  Endo/Heme/Allergies: Does not bruise/bleed easily.  Psychiatric/Behavioral: Negative for substance abuse. The patient is not nervous/anxious.    PMH Past Medical History:  Diagnosis Date  . Anemia   . Back injury   . Barrett's esophagus   . 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 Chronic back pain   . Complication of anesthesia    possible aggression coming out of anesthesia  . Gallstones   . GERD (gastroesophageal reflux disease)   . Head ache   . Migraine   . Osteoarthritis   . Status post dilation of esophageal narrowing   . Vertigo    PSH Past Surgical History:  Procedure Laterality Date  . BREAST SURGERY Bilateral    rt fibroid 90. lft 94  . CARPAL TUNNEL RELEASE Left   . CARPAL TUNNEL RELEASE Right 12/2016  . CHOLECYSTECTOMY  07/16/2011   Procedure: LAPAROSCOPIC CHOLECYSTECTOMY WITH INTRAOPERATIVE CHOLANGIOGRAM;  Surgeon: Merrie Roof, MD;  Location: [facility];  Service: General;  Laterality: N/A;  . HAND SURGERY     cartilage  . LEEP    . TONSILLECTOMY     Fam HX Family History  Problem Relation Age of Onset  . Hypertension Mother   . Breast cancer Mother   . Colon polyps Father   . Hypertension Father   . Diabetes Maternal Grandmother   . Diabetes Paternal Grandmother   . Prostate cancer Paternal Grandfather   . Diabetes Maternal Aunt   . Irritable bowel syndrome Son   .  Colon polyps Paternal Uncle   . Prostate cancer Paternal Uncle   . Colon polyps Paternal Uncle   . Colon cancer Neg Hx   . Esophageal cancer Neg Hx   . Stomach cancer Neg Hx    Social Hx  reports that she has never smoked. She has never used smokeless tobacco. She reports previous alcohol use. She reports that she does not use drugs.  Allergy Allergies  Allergen Reactions  . Baclofen Other (See Comments) and Swelling    Lower limb swelling  . Celebrex  [Celecoxib] Other (See Comments)    Excessive bloating GI pain Paleness Excessive sleeping Mood change (aggression)  . Diclofenac Sodium Other (See Comments)    elevated liver enzymes  . Flexeril [Cyclobenzaprine] Other (See Comments)    Other reaction(s): Headache Headache  Headache   . Gabapentin Swelling    Tears up stomach Feet swell  . Nsaids     Mobic tears up stomach.  . Ondansetron Hcl Other (See Comments)    Other reaction(s): Headache migraines  . Zofran Other (See Comments)    migraines   Home Meds Prior to Admission medications   Medication Sig Start Date End Date Taking? Authorizing Provider  cyclobenzaprine (FLEXERIL) 10 MG tablet Take 10 mg by mouth 2 (two) times daily. 04/21/18  Yes [provider]  DULoxetine (CYMBALTA) 60 MG capsule TAKE 1 CAPSULE EVERY DAY Patient taking differently: Take 60 mg by mouth daily.  08/01/17  Yes Patel, Domenick Bookbinder, MD  estradiol (VIVELLE-DOT) 0.1 MG/24HR patch Place 1 patch onto the skin 2 (two) times a week. 05/30/17  Yes [provider]  HYDROcodone-acetaminophen (NORCO) 7.5-325 MG tablet Take 1 tablet by mouth 3 (three) times daily as needed for moderate pain.  04/24/18  Yes [provider]  omeprazole (PRILOSEC) 20 MG capsule Take 20 mg by mouth daily.   Yes [provider]  AMBULATORY NON FORMULARY MEDICATION Medication Name: Diltiazem 2% gel with 5% Lidocaine Apply a pea sized amount internally four times daily. Dispense 30 GM zero refill Patient not taking: Reported on 05/04/2018 10/25/17   Yetta Flock, MD  aspirin-acetaminophen-caffeine (EXCEDRIN MIGRAINE) [phone] MG tablet Take by mouth every 6 (six) hours as needed for headache.    [provider]  diclofenac sodium (VOLTAREN) 1 % GEL Apply 2 g topically 4 (four) times daily. Patient not taking: Reported on 05/04/2018 09/27/17   Jamse Arn, MD  tiZANidine (ZANAFLEX) 4 MG tablet TAKE 1/2 TABLET TWICE DAILY AS NEEDED FOR  MUSCLE SPASM(S) Patient not taking: Reported on 05/04/2018 11/28/17   Jamse Arn, MD    Physical Exam: Vitals:   05/04/18 2230 05/04/18 2300 05/05/18 0000 05/05/18 0049  BP: 115/66 118/69 134/74 130/69  Pulse: 92 92 (!) 106 82  Resp: (!) 21 20  16   Temp:      TempSrc:      SpO2: 91% 91% 96% 95%    Constitutional - resting comfortably, no acute distress Eyes - vision grossly intact. Sclera anicteric.  Nose - no gross deformity or drainage Mouth - no oral lesions noted Throat - no swelling or erythema Endo - no obvious thyromegaly CV -RRR (+)S1S2, no murmurs; no JVD or peripheral edema Resp -frequent cough during exam.  Fair aeration bilaterally.  Wheezy and rhonchi bilaterally. GI - (+)BS, soft, non-tender, non-distended MSK - normal range of motion, no obvious deformity Skin - no obvious rashes or lesions Neuro - alert, aware, oriented to person/place/time. No gross deficit Psych -  calm, normal mood and affect  Labs on Admission: I have personally reviewed following labs and imaging studies  CBC: Recent Labs  Lab 05/04/18 1438  WBC 4.2  NEUTROABS 2.8  HGB 15.0  HCT 46.4*  MCV 90.6  PLT 347   Basic Metabolic Panel: Recent Labs  Lab 05/04/18 1438  NA 138  K 3.8  CL 103  CO2 28  GLUCOSE 110*  BUN 15  CREATININE 0.92  CALCIUM 9.0   GFR: CrCl cannot be calculated (Unknown ideal weight.). Liver Function Tests: Recent Labs  Lab 05/04/18 1438  AST 40  ALT 47*  ALKPHOS 53  BILITOT 0.7  PROT 6.6  ALBUMIN 3.5   No results for input(s): LIPASE, AMYLASE in the last 168 hours. No results for input(s): AMMONIA in the last 168 hours. Coagulation Profile: No results for input(s): INR, PROTIME in the last 168 hours. Cardiac Enzymes: No results for input(s): CKTOTAL, CKMB, CKMBINDEX, TROPONINI in the last 168 hours. BNP (last 3 results) No results for input(s): PROBNP in the last 8760 hours. HbA1C: No results for input(s): HGBA1C in the last 72  hours. CBG: No results for input(s): GLUCAP in the last 168 hours. Lipid Profile: No results for input(s): CHOL, HDL, LDLCALC, TRIG, CHOLHDL, LDLDIRECT in the last 72 hours. Thyroid Function Tests: No results for input(s): TSH, T4TOTAL, FREET4, T3FREE, THYROIDAB in the last 72 hours. Anemia Panel: No results for input(s): VITAMINB12, FOLATE, FERRITIN, TIBC, IRON, RETICCTPCT in the last 72 hours. Urine analysis:    Component Value Date/Time   COLORURINE YELLOW 05/10/2014 1955   APPEARANCEUR CLEAR 05/10/2014 1955   LABSPEC >1.030 (H) 05/10/2014 1955   PHURINE 5.5 05/10/2014 1955   GLUCOSEU NEGATIVE 05/10/2014 1955   HGBUR TRACE (A) 05/10/2014 1955   BILIRUBINUR NEGATIVE 05/10/2014 1955   KETONESUR NEGATIVE 05/10/2014 1955   PROTEINUR NEGATIVE 05/10/2014 1955   UROBILINOGEN 0.2 05/10/2014 1955   NITRITE NEGATIVE 05/10/2014 1955   LEUKOCYTESUR TRACE (A) 05/10/2014 1955    Sepsis Labs:  No leukocytosis.  Radiological Exams on Admission: Dg Chest 2 View  Result Date: 05/04/2018 CLINICAL DATA:  Recent flight to [place] 04/29/2018 with subsequent shortness of breath, fever and chills with productive cough. EXAM: CHEST - 2 VIEW COMPARISON:  None. FINDINGS: Lungs are somewhat hypoinflated with mild patchy perihilar opacification and prominence of the perihilar markings. No effusion. Cardiomediastinal silhouette and remainder of the exam is unremarkable. IMPRESSION: Mild prominence of the perihilar markings with subtle patchy perihilar density. Findings may be due to mild vascular congestion, acute bronchitic process or perihilar pneumonia. Electronically Signed   By: Marin Olp M.D.   On: 05/04/2018 16:06    All images have been reviewed by me personally.   EKG: Normal sinus rhythm. TWI in inferior leads.  Assessment/Plan Active Problems:   Influenza A  Dyspnea/cough/influenza A: reportedly desaturated to mid 70s in ED.  Satting in mid 90s 2 L by nasal cannula.  No history of  asthma, COPD, cardiac disease, diabetes or CKD. -Continue Tamiflu -Continue Solu-Medrol and DuoNeb. -Mucolytic's  Abnormal EKG: T wave inversion in inferior leads.  Patient without chest pain.  Unknown significance of this. -Repeat EKG in the morning. -Check troponin x1  DVT prophylaxis: Subcu Lovenox Code Status: Full code Family Communication: None at bedside Disposition Plan: MedSurg Consults called: None Admission status: Observation status.   Mercy Riding MD [facility]  If 7PM-7AM, please contact night-coverage [email or web address] Password [email or web address]  05/05/2018, 1:31 AM

## 2018-05-05 NOTE — Plan of Care (Signed)
  Problem: Education: Goal: Knowledge of General Education information will improve Description: Including pain rating scale, medication(s)/side effects and non-pharmacologic comfort measures Outcome: Progressing   Problem: Pain Managment: Goal: General experience of comfort will improve Outcome: Progressing   

## 2018-05-05 NOTE — Progress Notes (Addendum)
Patient admitted after midnight. Presented with cough and sob. Reportedly desated to 70's in ED. Positive influenza A, chest xray Mild prominence of the perihilar markings with subtle patchy perihilar density. Findings may be due to mild vascular congestion, acute bronchitic process or perihilar pneumonia per report. Also hypkalemia. Provided with tamiflu, one dose rocephin and then doxy, solumedrol, nebs and anti-tussive. Some improvement this am but cough continues  PE Gen: obese alert ambulating in room in no acute distress CV tachycardia but regular, trace LE edema, no mgr Resp: mild increased work of breathing with conversation. BS diminished throughout. Faint end expiratory wheeze. No crackles.  Abd: obese soft +BS no guarding or rebounding   Plan: Change nebs to scheduled, increase solumedrol, replete potassium, check magnesium, continue doxy, bmet in am and wean oxygen as able.  Monitor oxygen saturation level  Santiago Glad m. Black, NP  Patient admitted after midnight, please see H&P.  Here with Flu A and acute respiratory failure.  Lung with diffuse wheezing.  CMP ordered to follow up on abnormal liver enzymes from April 2019.  Slow to recover.  Continue IV steroids-- wean as tolerated  Eulogio Bear DO

## 2018-05-05 NOTE — ED Notes (Signed)
Attempted to call report

## 2018-05-06 LAB — BASIC METABOLIC PANEL
Anion gap: 13 (ref 5–15)
BUN: 13 mg/dL (ref 6–20)
CALCIUM: 9.2 mg/dL (ref 8.9–10.3)
CO2: 26 mmol/L (ref 22–32)
Chloride: 104 mmol/L (ref 98–111)
Creatinine, Ser: 0.74 mg/dL (ref 0.44–1.00)
GFR calc Af Amer: >60 mL/min (ref >60)
GFR calc non Af Amer: >60 mL/min (ref >60)
Glucose, Bld: 191 mg/dL — ABNORMAL HIGH (ref 70–99)
Potassium: 5 mmol/L (ref 3.5–5.1)
Sodium: 143 mmol/L (ref 135–145)

## 2018-05-06 LAB — GLUCOSE, CAPILLARY
Glucose-Capillary: 208 mg/dL — ABNORMAL HIGH (ref 70–99)
Glucose-Capillary: 198 mg/dL — ABNORMAL HIGH (ref 70–99)
Glucose-Capillary: 153 mg/dL — ABNORMAL HIGH (ref 70–99)
Glucose-Capillary: 196 mg/dL — ABNORMAL HIGH (ref 70–99)

## 2018-05-06 LAB — MAGNESIUM: Magnesium: 2.1 mg/dL (ref 1.7–2.4)

## 2018-05-06 MED ORDER — ALBUTEROL SULFATE (2.5 MG/3ML) 0.083% IN NEBU
2.5000 mg | INHALATION_SOLUTION | RESPIRATORY_TRACT | Status: DC | PRN
  Administered 2018-05-06: 2.5 mg via RESPIRATORY_TRACT
  Filled 2018-05-06: qty 3

## 2018-05-06 MED ORDER — PREDNISONE 20 MG PO TABS
50.0000 mg | ORAL_TABLET | Freq: Every day | ORAL | Status: DC
  Administered 2018-05-07: 50 mg via ORAL
  Filled 2018-05-06 (×2): qty 2

## 2018-05-06 NOTE — Progress Notes (Addendum)
PROGRESS NOTE    Wanda Mcintosh  NLZ:[id number] DOB: [birth year] DOA: 05/04/2018 PCP: Bartholome Bill, MD     Brief Narrative:  Wanda Mcintosh is a 53 y.o. female with no significant medical history presenting with shortness of breath and cough. Recently went to [place] returned on February 8. Started having shortness of breath and cough since then. Thought it was her allergies acting out and tried to manage with allergy medication. Cough is mainly dry.  Also endorses some fever and mild headache.  In the ED, patient had a desaturation to mid 70s.  Influenza A+.  Started on Solu-Medrol, DuoNeb and Tamiflu.   New events last 24 hours / Subjective: Continues to have a cough, wheezes  Assessment & Plan:   Principal Problem:   Influenza A Active Problems:   Nonspecific abnormal electrocardiogram (ECG) (EKG)   Hyperglycemia   Acute respiratory failure with hypoxia (HCC)   Hypokalemia  Acute hypoxemic respiratory failure -Reportedly desatted to the mid 70s in the emergency department, currently on room air  Influenza A -Tamiflu  Acute viral bronchitis -Stop doxycycline -De-escalate Solu-Medrol to oral prednisone starting tomorrow  Abnormal EKG -Had T wave inversions in the inferior leads, patient without any chest pain, troponin negative  Elevated liver enzyme -Follow-up as outpatient  Hyperglycemia without previous diagnosis of diabetes -Sliding scale insulin   DVT prophylaxis: Lovenox Code Status: Full code Family Communication: At bedside Disposition Plan: Pending further improvement of respiratory status, hopeful discharge home 2/16   Consultants:   None  Procedures:   None  Antimicrobials:  Anti-infectives (From admission, onward)   Start     Dose/Rate Route Frequency Ordered Stop   05/05/18 1000  doxycycline (VIBRAMYCIN) 100 mg in sodium chloride 0.9 % 250 mL IVPB     100 mg 125 mL/hr over 120 Minutes Intravenous Every 12 hours 05/05/18 0954      05/05/18 0030  oseltamivir (TAMIFLU) capsule 75 mg     75 mg Oral 2 times daily 05/05/18 0018 05/09/18 2159   05/04/18 1845  cefTRIAXone (ROCEPHIN) 1 g in sodium chloride 0.9 % 100 mL IVPB     1 g 200 mL/hr over 30 Minutes Intravenous  Once 05/04/18 1844 05/04/18 2020        Objective: Vitals:   05/05/18 1827 05/05/18 2043 05/05/18 2300 05/06/18 0733  BP: 120/74  119/68 109/69  Pulse: 72  82 64  Resp: (!) 24  18   Temp: 98.2 F (36.8 C)  98.7 F (37.1 C) 97.9 F (36.6 C)  TempSrc: Oral  Oral Oral  SpO2: 92% 93% 93% 94%  Weight:      Height:        Intake/Output Summary (Last 24 hours) at 05/06/2018 1040 Last data filed at 05/05/2018 1816 Gross per 24 hour  Intake 480 ml  Output -  Net 480 ml   Filed Weights   05/05/18 0237  Weight: 120.6 kg    Examination:  General exam: Appears calm and comfortable  Respiratory system: Diminished breath sounds with expiratory wheezes bilaterally, remains on room air Cardiovascular system: S1 & S2 heard, RRR. No JVD, murmurs, rubs, gallops or clicks. No pedal edema. Gastrointestinal system: Abdomen is nondistended, soft and nontender. No organomegaly or masses felt. Normal bowel sounds heard. Central nervous system: Alert and oriented. No focal neurological deficits. Extremities: Symmetric Skin: No rashes, lesions or ulcers Psychiatry: Judgement and insight appear normal. Mood & affect appropriate.   Data Reviewed: I have personally reviewed following  labs and imaging studies  CBC: Recent Labs  Lab 05/04/18 1438 05/05/18 0229  WBC 4.2 4.7  NEUTROABS 2.8  --   HGB 15.0 14.4  HCT 46.4* 43.0  MCV 90.6 89.4  PLT 162 009*   Basic Metabolic Panel: Recent Labs  Lab 05/04/18 1438 05/05/18 0229 05/06/18 0213  NA 138 138 143  K 3.8 3.0* 5.0  CL 103 102 104  CO2 28 25 26   GLUCOSE 110* 324* 191*  BUN 15 12 13   CREATININE 0.92 0.97 0.74  CALCIUM 9.0 8.4* 9.2  MG  --   --  2.1   GFR: Estimated Creatinine Clearance:  113.7 mL/min (by C-G formula based on SCr of 0.74 mg/dL). Liver Function Tests: Recent Labs  Lab 05/04/18 1438  AST 40  ALT 47*  ALKPHOS 53  BILITOT 0.7  PROT 6.6  ALBUMIN 3.5   No results for input(s): LIPASE, AMYLASE in the last 168 hours. No results for input(s): AMMONIA in the last 168 hours. Coagulation Profile: No results for input(s): INR, PROTIME in the last 168 hours. Cardiac Enzymes: Recent Labs  Lab 05/05/18 0229  TROPONINI <0.03   BNP (last 3 results) No results for input(s): PROBNP in the last 8760 hours. HbA1C: No results for input(s): HGBA1C in the last 72 hours. CBG: Recent Labs  Lab 05/05/18 1722 05/05/18 2300 05/06/18 0732  GLUCAP 211* 192* 208*   Lipid Profile: No results for input(s): CHOL, HDL, LDLCALC, TRIG, CHOLHDL, LDLDIRECT in the last 72 hours. Thyroid Function Tests: No results for input(s): TSH, T4TOTAL, FREET4, T3FREE, THYROIDAB in the last 72 hours. Anemia Panel: No results for input(s): VITAMINB12, FOLATE, FERRITIN, TIBC, IRON, RETICCTPCT in the last 72 hours. Sepsis Labs: No results for input(s): PROCALCITON, LATICACIDVEN in the last 168 hours.  No results found for this or any previous visit (from the past 240 hour(s)).     Radiology Studies: Dg Chest 2 View  Result Date: 05/04/2018 CLINICAL DATA:  Recent flight to [place] 04/29/2018 with subsequent shortness of breath, fever and chills with productive cough. EXAM: CHEST - 2 VIEW COMPARISON:  None. FINDINGS: Lungs are somewhat hypoinflated with mild patchy perihilar opacification and prominence of the perihilar markings. No effusion. Cardiomediastinal silhouette and remainder of the exam is unremarkable. IMPRESSION: Mild prominence of the perihilar markings with subtle patchy perihilar density. Findings may be due to mild vascular congestion, acute bronchitic process or perihilar pneumonia. Electronically Signed   By: Marin Olp M.D.   On: 05/04/2018 16:06      Scheduled  Meds: . dextromethorphan-guaiFENesin  1 tablet Oral BID  . DULoxetine  60 mg Oral Daily  . enoxaparin (LOVENOX) injection  40 mg Subcutaneous Q24H  . insulin aspart  0-20 Units Subcutaneous TID WC  . insulin aspart  0-5 Units Subcutaneous QHS  . methylPREDNISolone (SOLU-MEDROL) injection  60 mg Intravenous Q6H  . oseltamivir  75 mg Oral BID   Continuous Infusions: . doxycycline (VIBRAMYCIN) IV 100 mg (05/06/18 0904)     LOS: 1 day    Time spent: 35 minutes   Dessa Phi, DO [facility] [email or web address] 05/06/2018, 10:40 AM

## 2018-05-07 LAB — GLUCOSE, CAPILLARY
Glucose-Capillary: 133 mg/dL — ABNORMAL HIGH (ref 70–99)
Glucose-Capillary: 178 mg/dL — ABNORMAL HIGH (ref 70–99)

## 2018-05-07 MED ORDER — OSELTAMIVIR PHOSPHATE 75 MG PO CAPS: 75.0000 mg | ORAL_CAPSULE | Freq: Two times a day (BID) | ORAL | 0 refills | Status: AC

## 2018-05-07 MED ORDER — PREDNISONE 10 MG PO TABS: ORAL_TABLET | ORAL | 0 refills | Status: DC

## 2018-05-07 MED ORDER — BENZONATATE 200 MG PO CAPS: 200.0000 mg | ORAL_CAPSULE | Freq: Three times a day (TID) | ORAL | 0 refills | Status: DC | PRN

## 2018-05-07 NOTE — Discharge Summary (Signed)
Physician Discharge Summary  Wanda Mcintosh ULA:[id number] DOB: [birth year] DOA: 05/04/2018  PCP: Bartholome Bill, MD  Admit date: 05/04/2018 Discharge date: 05/07/2018  Admitted From: Home Disposition:  Home  Recommendations for Outpatient Follow-up:  1. Follow up with PCP in 1 week  Discharge Condition: Stable CODE STATUS: Full  Diet recommendation: Regular   Brief/Interim Summary: Wanda K Milleris a 53 y.o.femalewithno significant medical history presenting with shortness of breath and cough. Recently went to [place] returned on February 8. Started having shortness of breath and cough since then. Thought it was her allergies acting out and tried to manage with allergy medication. Cough is mainly dry. Also endorses some fever and mild headache. In the ED, patient had a desaturation to mid 70s. Influenza A+. Started on Solu-Medrol, DuoNeb and Tamiflu.   Discharge Diagnoses:   Acute hypoxemic respiratory failure -Reportedly desatted to the mid 70s in the emergency department, currently on room air  Influenza A -Tamiflu  Acute viral bronchitis -Stop doxycycline -Prednisone taper   Abnormal EKG -Had T wave inversions in the inferior leads, patient without any chest pain, troponin negative  Elevated liver enzyme -Follow-up as outpatient  Hyperglycemia without previous diagnosis of diabetes -Sliding scale insulin   Discharge Instructions  Discharge Instructions    Call MD for:  difficulty breathing, headache or visual disturbances   Complete by:  As directed    Call MD for:  extreme fatigue   Complete by:  As directed    Call MD for:  persistant dizziness or light-headedness   Complete by:  As directed    Call MD for:  persistant nausea and vomiting   Complete by:  As directed    Call MD for:  severe uncontrolled pain   Complete by:  As directed    Call MD for:  temperature >100.4   Complete by:  As directed    Diet general   Complete by:  As  directed    Discharge instructions   Complete by:  As directed    You were cared for by a hospitalist during your hospital stay. If you have any questions about your discharge medications or the care you received while you were in the hospital after you are discharged, you can call the unit and ask to speak with the hospitalist on call if the hospitalist that took care of you is not available. Once you are discharged, your primary care physician will handle any further medical issues. Please note that NO REFILLS for any discharge medications will be authorized once you are discharged, as it is imperative that you return to your primary care physician (or establish a relationship with a primary care physician if you do not have one) for your aftercare needs so that they can reassess your need for medications and monitor your lab values.   Increase activity slowly   Complete by:  As directed      Allergies as of 05/07/2018      Reactions   Baclofen Other (See Comments), Swelling   Lower limb swelling   Celebrex [celecoxib] Other (See Comments)   Excessive bloating GI pain Paleness Excessive sleeping Mood change (aggression)   Diclofenac Sodium Other (See Comments)   elevated liver enzymes   Flexeril [cyclobenzaprine] Other (See Comments)   Other reaction(s): Headache Headache  Headache    Gabapentin Swelling   Tears up stomach Feet swell   Nsaids    Mobic tears up stomach.   Ondansetron Hcl Other (See Comments)  Other reaction(s): Headache migraines   Zofran Other (See Comments)   migraines      Medication List    TAKE these medications   AMBULATORY NON FORMULARY MEDICATION Medication Name: Diltiazem 2% gel with 5% Lidocaine Apply a pea sized amount internally four times daily. Dispense 30 GM zero refill   aspirin-acetaminophen-caffeine 250-250-65 MG tablet Commonly known as:  EXCEDRIN MIGRAINE Take by mouth every 6 (six) hours as needed for headache.   benzonatate 200  MG capsule Commonly known as:  TESSALON Take 1 capsule (200 mg total) by mouth 3 (three) times daily as needed for cough.   cyclobenzaprine 10 MG tablet Commonly known as:  FLEXERIL Take 10 mg by mouth 2 (two) times daily.   diclofenac sodium 1 % Gel Commonly known as:  VOLTAREN Apply 2 g topically 4 (four) times daily.   DULoxetine 60 MG capsule Commonly known as:  CYMBALTA TAKE 1 CAPSULE EVERY DAY   estradiol 0.1 MG/24HR patch Commonly known as:  VIVELLE-DOT Place 1 patch onto the skin 2 (two) times a week.   HYDROcodone-acetaminophen 7.5-325 MG tablet Commonly known as:  NORCO Take 1 tablet by mouth 3 (three) times daily as needed for moderate pain.   omeprazole 20 MG capsule Commonly known as:  PRILOSEC Take 20 mg by mouth daily.   oseltamivir 75 MG capsule Commonly known as:  TAMIFLU Take 1 capsule (75 mg total) by mouth 2 (two) times daily for 2 days.   predniSONE 10 MG tablet Commonly known as:  DELTASONE Take 4 tabs for 3 days, then 3 tabs for 3 days, then 2 tabs for 3 days, then 1 tab for 3 days, then 1/2 tab for 4 days.   tiZANidine 4 MG tablet Commonly known as:  ZANAFLEX TAKE 1/2 TABLET TWICE DAILY AS NEEDED FOR MUSCLE SPASM(S)      Follow-up Information    Bartholome Bill, MD. Schedule an appointment as soon as possible for a visit in 1 week(s).   Specialty:  Family Medicine Contact information: [place] [address] [phone]          Allergies  Allergen Reactions  . Baclofen Other (See Comments) and Swelling    Lower limb swelling  . Celebrex [Celecoxib] Other (See Comments)    Excessive bloating GI pain Paleness Excessive sleeping Mood change (aggression)  . Diclofenac Sodium Other (See Comments)    elevated liver enzymes  . Flexeril [Cyclobenzaprine] Other (See Comments)    Other reaction(s): Headache Headache  Headache   . Gabapentin Swelling    Tears up stomach Feet swell  . Nsaids      Mobic tears up stomach.  . Ondansetron Hcl Other (See Comments)    Other reaction(s): Headache migraines  . Zofran Other (See Comments)    migraines    Consultations:  None   Procedures/Studies: Dg Chest 2 View  Result Date: 05/04/2018 CLINICAL DATA:  Recent flight to [place] 04/29/2018 with subsequent shortness of breath, fever and chills with productive cough. EXAM: CHEST - 2 VIEW COMPARISON:  None. FINDINGS: Lungs are somewhat hypoinflated with mild patchy perihilar opacification and prominence of the perihilar markings. No effusion. Cardiomediastinal silhouette and remainder of the exam is unremarkable. IMPRESSION: Mild prominence of the perihilar markings with subtle patchy perihilar density. Findings may be due to mild vascular congestion, acute bronchitic process or perihilar pneumonia. Electronically Signed   By: Marin Olp M.D.   On: 05/04/2018 16:06  Discharge Exam: Vitals:   05/06/18 2300 05/07/18 0834  BP: 116/64 129/81  Pulse: 71 65  Resp: 18 18  Temp: 97.9 F (36.6 C) 98.3 F (36.8 C)  SpO2: 93% 95%    General: Pt is alert, awake, not in acute distress Cardiovascular: RRR, S1/S2 +, no rubs, no gallops Respiratory: Minimal expiratory wheezes, no respiratory distress on room air  Abdominal: Soft, NT, ND, bowel sounds + Extremities: no edema, no cyanosis    The results of significant diagnostics from this hospitalization (including imaging, microbiology, ancillary and laboratory) are listed below for reference.     Microbiology: No results found for this or any previous visit (from the past 240 hour(s)).   Labs: BNP (last 3 results) No results for input(s): BNP in the last 8760 hours. Basic Metabolic Panel: Recent Labs  Lab 05/04/18 1438 05/05/18 0229 05/06/18 0213  NA 138 138 143  K 3.8 3.0* 5.0  CL 103 102 104  CO2 28 25 26   GLUCOSE 110* 324* 191*  BUN 15 12 13   CREATININE 0.92 0.97 0.74  CALCIUM 9.0 8.4* 9.2  MG  --   --  2.1    Liver Function Tests: Recent Labs  Lab 05/04/18 1438  AST 40  ALT 47*  ALKPHOS 53  BILITOT 0.7  PROT 6.6  ALBUMIN 3.5   No results for input(s): LIPASE, AMYLASE in the last 168 hours. No results for input(s): AMMONIA in the last 168 hours. CBC: Recent Labs  Lab 05/04/18 1438 05/05/18 0229  WBC 4.2 4.7  NEUTROABS 2.8  --   HGB 15.0 14.4  HCT 46.4* 43.0  MCV 90.6 89.4  PLT 162 147*   Cardiac Enzymes: Recent Labs  Lab 05/05/18 0229  TROPONINI <0.03   BNP: Invalid input(s): POCBNP CBG: Recent Labs  Lab 05/06/18 0732 05/06/18 1138 05/06/18 1705 05/06/18 2115 05/07/18 0722  GLUCAP 208* 198* 153* 196* 133*   D-Dimer No results for input(s): DDIMER in the last 72 hours. Hgb A1c No results for input(s): HGBA1C in the last 72 hours. Lipid Profile No results for input(s): CHOL, HDL, LDLCALC, TRIG, CHOLHDL, LDLDIRECT in the last 72 hours. Thyroid function studies No results for input(s): TSH, T4TOTAL, T3FREE, THYROIDAB in the last 72 hours.  Invalid input(s): FREET3 Anemia work up No results for input(s): VITAMINB12, FOLATE, FERRITIN, TIBC, IRON, RETICCTPCT in the last 72 hours. Urinalysis    Component Value Date/Time   COLORURINE YELLOW 05/10/2014 1955   APPEARANCEUR CLEAR 05/10/2014 1955   LABSPEC >1.030 (H) 05/10/2014 1955   PHURINE 5.5 05/10/2014 1955   GLUCOSEU NEGATIVE 05/10/2014 1955   HGBUR TRACE (A) 05/10/2014 1955   BILIRUBINUR NEGATIVE 05/10/2014 1955   KETONESUR NEGATIVE 05/10/2014 1955   PROTEINUR NEGATIVE 05/10/2014 1955   UROBILINOGEN 0.2 05/10/2014 1955   NITRITE NEGATIVE 05/10/2014 1955   LEUKOCYTESUR TRACE (A) 05/10/2014 1955   Sepsis Labs Invalid input(s): PROCALCITONIN,  WBC,  LACTICIDVEN Microbiology No results found for this or any previous visit (from the past 240 hour(s)).   Patient was seen and examined on the day of discharge and was found to be in stable condition. Time coordinating discharge: 25 minutes including  assessment and coordination of care, as well as examination of the patient.   SIGNED:  Dessa Phi, DO [facility] [email or web address] 05/07/2018, 10:45 AM

## 2018-09-06 ENCOUNTER — Other Ambulatory Visit: Payer: Self-pay | Admitting: Orthopedic Surgery

## 2018-09-06 DIAGNOSIS — M25561 Pain in right knee: Secondary | ICD-10-CM

## 2018-09-30 ENCOUNTER — Ambulatory Visit
Admission: RE | Admit: 2018-09-30 | Discharge: 2018-09-30 | Disposition: A | Discharge status: Home / Self Care | Payer: Medicare HMO | Source: Ambulatory Visit | Attending: Orthopedic Surgery | Admitting: Orthopedic Surgery

## 2018-09-30 ENCOUNTER — Other Ambulatory Visit: Payer: Self-pay

## 2018-09-30 DIAGNOSIS — M25561 Pain in right knee: Secondary | ICD-10-CM

## 2018-10-23 ENCOUNTER — Ambulatory Visit: Payer: Medicare HMO | Attending: Orthopedic Surgery | Admitting: Physical Therapy

## 2018-10-23 ENCOUNTER — Encounter: Payer: Self-pay | Admitting: Physical Therapy

## 2018-10-23 ENCOUNTER — Other Ambulatory Visit: Payer: Self-pay

## 2018-10-23 DIAGNOSIS — M25562 Pain in left knee: Secondary | ICD-10-CM | POA: Diagnosis present

## 2018-10-23 DIAGNOSIS — R2689 Other abnormalities of gait and mobility: Secondary | ICD-10-CM | POA: Diagnosis present

## 2018-10-23 DIAGNOSIS — M6281 Muscle weakness (generalized): Secondary | ICD-10-CM

## 2018-10-23 DIAGNOSIS — G8929 Other chronic pain: Secondary | ICD-10-CM | POA: Diagnosis present

## 2018-10-23 DIAGNOSIS — R600 Localized edema: Secondary | ICD-10-CM

## 2018-10-23 DIAGNOSIS — M25561 Pain in right knee: Secondary | ICD-10-CM | POA: Diagnosis present

## 2018-10-23 NOTE — Therapy (Signed)
[place], [place], [address] Phone: [phone]   Fax:  [phone]  Physical Therapy Evaluation  Patient Details  Name: Wanda Mcintosh MRN: [id number] Date of Birth: Apr 04, 1965 Referring Provider (PT): Marchia Bond   Encounter Date: 10/23/2018  PT End of Session - 10/23/18 1056    Visit Number  1    Number of Visits  12    Date for PT Re-Evaluation  12/04/18    Authorization Type  MCR: kx mod at 15th visit, progress note at 10th    PT Start Time  1058    PT Stop Time  1140    PT Time Calculation (min)  42 min    Activity Tolerance  Patient tolerated treatment well    Behavior During Therapy  [facility] for tasks assessed/performed       Past Medical History:  Diagnosis Date  . Anemia   . Back injury   . Barrett's esophagus   . Cancer ([place])    cervica-l leep  . Chronic back pain   . Complication of anesthesia    possible aggression coming out of anesthesia  . Gallstones   . GERD (gastroesophageal reflux disease)   . Head ache   . Migraine   . Osteoarthritis   . Status post dilation of esophageal narrowing   . Vertigo     Past Surgical History:  Procedure Laterality Date  . BREAST SURGERY Bilateral    rt fibroid 90. lft 94  . CARPAL TUNNEL RELEASE Left   . CARPAL TUNNEL RELEASE Right 12/2016  . CHOLECYSTECTOMY  07/16/2011   Procedure: LAPAROSCOPIC CHOLECYSTECTOMY WITH INTRAOPERATIVE CHOLANGIOGRAM;  Surgeon: Merrie Roof, MD;  Location: [place];  Service: General;  Laterality: N/A;  . HAND SURGERY     cartilage  . LEEP    . TONSILLECTOMY      There were no vitals filed for this visit.   Subjective Assessment - 10/23/18 1103    Subjective  pt is a 53 yo F with CC of bil knee pain. She reports while she was in [place] back in February and the R knee was swollen with which she feels it occured due to potentially twisting and reports the symptoms gradually worsened in April and has  fluctuated starting about a week ago. The L knee started back in April with no specific MOI. She has hx or having L knee injections due to pain. pain stays mostly in the knees located above the patella and along the inside of the knee.    Limitations  Standing;Walking;Sitting    How long can you sit comfortably?  unlimited    How long can you stand comfortably?  5-6 min    How long can you walk comfortably?  unsure    Diagnostic tests  MRI for the R knee    Patient Stated Goals  be more functoinal to move without pain    Currently in Pain?  Yes    Pain Score  1    at worst 4/10, last took medication for pain at 9am   Pain Location  Knee    Pain Orientation  Right;Left;Medial    Pain Descriptors / Indicators  Burning;Nagging   pulling   Pain Type  Chronic pain    Pain Onset  More than a month ago    Pain Frequency  Intermittent    Aggravating Factors   standing/ walking, stairs, standing up / sitting    Pain  Relieving Factors  laying down, putting pilow between knees, rest, repositiong    Effect of Pain on Daily Activities  limited standing/ walking endurnce         [facility] PT Assessment - 10/23/18 1057      Assessment   Medical Diagnosis  R knee PFPS, and L knee OA    Referring Provider (PT)  Marchia Bond    Onset Date/Surgical Date  --   Febaruary 2020 for R knee, L knee in April 2020   Hand Dominance  Right    Next MD Visit  3 months    Prior Therapy  yes      Precautions   Precautions  None      Restrictions   Weight Bearing Restrictions  No      Balance Screen   Has the patient fallen in the past 6 months  No    Has the patient had a decrease in activity level because of a fear of falling?   No    Is the patient reluctant to leave their home because of a fear of falling?   No      Home Environment   Living Environment  Private residence    Living Arrangements  Children    Available Help at Discharge  Family    Type of [place] to enter     Entrance Stairs-Number of Steps  6    Entrance Stairs-Rails  Cannot reach both    [place]  Two level    Alternate Level Stairs-Number of Steps  14    Alternate Level Stairs-Rails  Right   ascending   Home Equipment  [place] - single point      Prior Function   Level of Independence  Independent with basic ADLs    Vocation  On disability      Cognition   Overall Cognitive Status  Within Functional Limits for tasks assessed      Observation/Other Assessments   Focus on Therapeutic Outcomes (FOTO)   67% limited   predicted 52% limited     Posture/Postural Control   Posture/Postural Control  Postural limitations      ROM / Strength   AROM / PROM / Strength  AROM;PROM;Strength      AROM   AROM Assessment Site  Knee    Right/Left Knee  Right;Left    Right Knee Extension  8    Right Knee Flexion  142    Left Knee Extension  140    Left Knee Flexion  0      PROM   PROM Assessment Site  Knee      Strength   Strength Assessment Site  Hip;Knee    Right/Left Hip  Right;Left    Right Hip Flexion  4-/5    Right Hip ABduction  4-/5    Left Hip Flexion  4-/5    Left Hip ABduction  4-/5    Right/Left Knee  Right;Left    Right Knee Flexion  4/5    Right Knee Extension  4/5   reproduced concordant pain   Left Knee Flexion  4/5    Left Knee Extension  4/5      Palpation   Patella mobility  bil lateral tracking of patella,    Palpation comment  TTP along  medial and latera patellar poles, and the medial joint      Ambulation/Gait   Ambulation/Gait  Yes  Objective measurements completed on examination: See above findings.      [facility] Adult PT Treatment/Exercise - 10/23/18 1057      Exercises   Exercises  Knee/Hip      Knee/Hip Exercises: Supine   Quad Sets  1 set;10 reps;Strengthening;Both   with ball squeeze for VMO activation   Bridges with [employer]  Strengthening;Both;1 set;10 reps      Knee/Hip Exercises: Sidelying   Hip ABduction   Strengthening;Both;1 set;10 reps      Manual Therapy   Manual Therapy  Taping    McConnell  R knee L>M pull              PT Education - 10/23/18 1056    Education Details  evaluaton findings, POC, goals, HEP with proper form/ rationale, benefits of taping and length of wear    Person(s) Educated  Patient    Methods  Explanation;Verbal cues;Handout    Comprehension  Verbalized understanding;Verbal cues required       PT Short Term Goals - 10/23/18 1149      PT SHORT TERM GOAL #1   Title  pt to be I with inital HEP    Period  Weeks    Status  New    Target Date  11/13/18        PT Long Term Goals - 10/23/18 1156      PT LONG TERM GOAL #1   Title  increase R knee extension to within 3 degrees to promote functional ROM required for efficient gait pattern    Time  6    Period  Weeks    Status  New    Target Date  12/04/18      PT LONG TERM GOAL #2   Title  increase bil hip abductor/ flexor strength to ./=4+/5 bil to promote hip/ knee stability for walking / standing    Time  6    Status  New    Target Date  12/04/18      PT LONG TERM GOAL #3   Title  pt to be able to stand/ walk >/= 30 min and navigate 12 steps reciprocally with no report of pain or limitatoins for functional mobility    Time  6    Period  Weeks    Status  New    Target Date  12/04/18      PT LONG TERM GOAL #4   Title  increase FOTO score to </= 52% limited to demo improvement in function    Time  6    Period  Weeks    Status  New    Target Date  12/04/18      PT LONG TERM GOAL #5   Title  pt to be I with all HEP given as of last visit to maintain and progress current level of function    Time  6    Period  Weeks    Status  New    Target Date  12/04/18             Plan - 10/23/18 1103    Clinical Impression Statement  pt presents to OPPT with CC of bil knee pain R>L starting back in February for the R knee and April for the L knee. She has functional ROM with mild TKE noted on  the R. TTP bil peri-patellar with bil lateral patellar til, and genu valgum bil with R>L. She would benefit from physical therapy to reduce knee pain, increase  hip/ knee strength, increased walking/ standing endurnace, and maximize her function by addressing the deficits listed.    Personal Factors and Comorbidities  Age;Comorbidity 3+    Comorbidities  hx of anemia, OA, and CX    Examination-Activity Limitations  Stand;Stairs;Locomotion Level    Stability/Clinical Decision Making  Evolving/Moderate complexity    Clinical Decision Making  Moderate    Rehab Potential  Good    PT Frequency  2x / week    PT Duration  6 weeks    PT Treatment/Interventions  ADLs/Self Care Home Management;Cryotherapy;Stair training;Functional mobility training;Gait training;Therapeutic exercise;Therapeutic activities;Balance training;Neuromuscular re-education;Manual techniques;Taping;Vasopneumatic Device;Dry needling;Patient/family education    PT Next Visit Plan  review/ update HEP, McConnel taping, hip strengthening, VMO activation, STW along vastus lateralis, gait training    PT Home Exercise Plan  bridge with ball squeeze, quad set with ball squeeze, sidelying hipo abduction, heel raise    Consulted and Agree with Plan of Care  Patient       Patient will benefit from skilled therapeutic intervention in order to improve the following deficits and impairments:  Abnormal gait, Pain, Obesity, Decreased activity tolerance, Decreased endurance, Decreased range of motion, Increased fascial restricitons, Decreased strength, Improper body mechanics, Postural dysfunction  Visit Diagnosis: 1. Chronic pain of left knee   2. Chronic pain of right knee   3. Muscle weakness (generalized)   4. Other abnormalities of gait and mobility   5. Localized edema        Problem List Patient Active Problem List   Diagnosis Date Noted  . Influenza A 05/05/2018  . Nonspecific abnormal electrocardiogram (ECG) (EKG) 05/05/2018  .  Hyperglycemia 05/05/2018  . Acute respiratory failure with hypoxia ([place]) 05/05/2018  . Hypokalemia 05/05/2018  . Cholelithiasis 06/21/2011   Starr Lake PT, DPT, LAT, ATC  10/23/18  12:17 PM       [facility] [address] [place], [place], [address] Phone: [phone]   Fax:  [phone]  Name: MONSERRATH JUNIO MRN: [id number] Date of Birth: 1965-05-03

## 2018-10-26 ENCOUNTER — Ambulatory Visit: Payer: Medicare HMO | Admitting: Physical Therapy

## 2018-11-01 ENCOUNTER — Other Ambulatory Visit: Payer: Self-pay

## 2018-11-01 ENCOUNTER — Ambulatory Visit: Payer: Medicare HMO | Admitting: Physical Therapy

## 2018-11-01 ENCOUNTER — Encounter: Payer: Self-pay | Admitting: Physical Therapy

## 2018-11-01 DIAGNOSIS — M25562 Pain in left knee: Secondary | ICD-10-CM | POA: Diagnosis not present

## 2018-11-01 DIAGNOSIS — G8929 Other chronic pain: Secondary | ICD-10-CM

## 2018-11-01 DIAGNOSIS — R2689 Other abnormalities of gait and mobility: Secondary | ICD-10-CM

## 2018-11-01 DIAGNOSIS — M6281 Muscle weakness (generalized): Secondary | ICD-10-CM

## 2018-11-01 DIAGNOSIS — M25561 Pain in right knee: Secondary | ICD-10-CM

## 2018-11-01 NOTE — Therapy (Signed)
[place], [place], [address] Phone: [phone]   Fax:  [phone]  Physical Therapy Treatment  Patient Details  Name: Wanda Mcintosh MRN: [id number] Date of Birth: August 21, 1965 Referring Provider (PT): Marchia Bond   Encounter Date: 11/01/2018  PT End of Session - 11/01/18 1335    Visit Number  2    Number of Visits  12    Date for PT Re-Evaluation  12/04/18    Authorization Type  MCR: kx mod at 15th visit, progress note at 10th    PT Start Time  1332    PT Stop Time  1412    PT Time Calculation (min)  40 min    Activity Tolerance  Patient tolerated treatment well    Behavior During Therapy  [facility] for tasks assessed/performed       Past Medical History:  Diagnosis Date  . Anemia   . Back injury   . Barrett's esophagus   . Cancer ([place])    cervica-l leep  . Chronic back pain   . Complication of anesthesia    possible aggression coming out of anesthesia  . Gallstones   . GERD (gastroesophageal reflux disease)   . Head ache   . Migraine   . Osteoarthritis   . Status post dilation of esophageal narrowing   . Vertigo     Past Surgical History:  Procedure Laterality Date  . BREAST SURGERY Bilateral    rt fibroid 90. lft 94  . CARPAL TUNNEL RELEASE Left   . CARPAL TUNNEL RELEASE Right 12/2016  . CHOLECYSTECTOMY  07/16/2011   Procedure: LAPAROSCOPIC CHOLECYSTECTOMY WITH INTRAOPERATIVE CHOLANGIOGRAM;  Surgeon: Merrie Roof, MD;  Location: [place];  Service: General;  Laterality: N/A;  . HAND SURGERY     cartilage  . LEEP    . TONSILLECTOMY      There were no vitals filed for this visit.  Subjective Assessment - 11/01/18 1334    Subjective  Knees hurt more today, Rt is worse. Cooked dinner last night, I usually sit but my back was bothering me. Stood on/off for an hour or so.    Patient Stated Goals  be more functoinal to move without pain    Currently in Pain?  Yes    Pain Score  6      Pain Location  Knee    Pain Orientation  Right;Left    Pain Descriptors / Indicators  Sore;Nagging;Aching                       [facility] Adult PT Treatment/Exercise - 11/01/18 0001      Pilates   Pilates Reformer  foot work -2R1B for double feet, 1R1B for single foot      Knee/Hip Exercises: Stretches   Piriformis Stretch Limitations  figure 4      Knee/Hip Exercises: Aerobic   Nustep  5 min L6 LE only      Manual Therapy   Manual Therapy  Soft tissue mobilization    Soft tissue mobilization  IASTM bilat VL               PT Short Term Goals - 10/23/18 1149      PT SHORT TERM GOAL #1   Title  pt to be I with inital HEP    Period  Weeks    Status  New    Target Date  11/13/18  PT Long Term Goals - 10/23/18 1156      PT LONG TERM GOAL #1   Title  increase R knee extension to within 3 degrees to promote functional ROM required for efficient gait pattern    Time  6    Period  Weeks    Status  New    Target Date  12/04/18      PT LONG TERM GOAL #2   Title  increase bil hip abductor/ flexor strength to ./=4+/5 bil to promote hip/ knee stability for walking / standing    Time  6    Status  New    Target Date  12/04/18      PT LONG TERM GOAL #3   Title  pt to be able to stand/ walk >/= 30 min and navigate 12 steps reciprocally with no report of pain or limitatoins for functional mobility    Time  6    Period  Weeks    Status  New    Target Date  12/04/18      PT LONG TERM GOAL #4   Title  increase FOTO score to </= 52% limited to demo improvement in function    Time  6    Period  Weeks    Status  New    Target Date  12/04/18      PT LONG TERM GOAL #5   Title  pt to be I with all HEP given as of last visit to maintain and progress current level of function    Time  6    Period  Weeks    Status  New    Target Date  12/04/18            Plan - 11/01/18 1407    Clinical Impression Statement  Utilized reformer for low impact  strengthening. Tends to place feet down with sensation of midline to her Right, felt odd with placement at true midline but denied knee pain. Notably poor control of slow movements- good correction with cuing.    PT Treatment/Interventions  ADLs/Self Care Home Management;Cryotherapy;Stair training;Functional mobility training;Gait training;Therapeutic exercise;Therapeutic activities;Balance training;Neuromuscular re-education;Manual techniques;Taping;Vasopneumatic Device;Dry needling;Patient/family education    PT Next Visit Plan  continue STW & taping PRN, stability training    PT Home Exercise Plan  bridge with ball squeeze, quad set with ball squeeze, sidelying hipo abduction, heel raise    Consulted and Agree with Plan of Care  Patient       Patient will benefit from skilled therapeutic intervention in order to improve the following deficits and impairments:  Abnormal gait, Pain, Obesity, Decreased activity tolerance, Decreased endurance, Decreased range of motion, Increased fascial restricitons, Decreased strength, Improper body mechanics, Postural dysfunction  Visit Diagnosis: 1. Chronic pain of left knee   2. Chronic pain of right knee   3. Muscle weakness (generalized)   4. Other abnormalities of gait and mobility        Problem List Patient Active Problem List   Diagnosis Date Noted  . Influenza A 05/05/2018  . Nonspecific abnormal electrocardiogram (ECG) (EKG) 05/05/2018  . Hyperglycemia 05/05/2018  . Acute respiratory failure with hypoxia ([place]) 05/05/2018  . Hypokalemia 05/05/2018  . Cholelithiasis 06/21/2011   Zoie Sarin C. Mckenley Birenbaum PT, DPT 11/01/18 2:13 PM   [facility] [address] [place], [place], [address] Phone: [phone]   Fax:  [phone]  Name: SHAVY BEACHEM MRN: [id number] Date of Birth: [birth year]

## 2018-11-06 ENCOUNTER — Ambulatory Visit: Payer: Medicare HMO | Admitting: Physical Therapy

## 2018-11-06 ENCOUNTER — Telehealth: Payer: Self-pay | Admitting: Physical Therapy

## 2018-11-06 NOTE — Telephone Encounter (Signed)
Spoke with pt regarding her missed appointment. She reported she was not aware of her appointment today. I told when her next scheduled appointment is and if she cannot make it to call and we can cancel or reschedule that appointment.

## 2018-11-08 ENCOUNTER — Encounter: Payer: Self-pay | Admitting: Physical Therapy

## 2018-11-08 ENCOUNTER — Ambulatory Visit: Payer: Medicare HMO | Admitting: Physical Therapy

## 2018-11-08 ENCOUNTER — Other Ambulatory Visit: Payer: Self-pay

## 2018-11-08 DIAGNOSIS — M6281 Muscle weakness (generalized): Secondary | ICD-10-CM

## 2018-11-08 DIAGNOSIS — M25562 Pain in left knee: Secondary | ICD-10-CM

## 2018-11-08 DIAGNOSIS — R2689 Other abnormalities of gait and mobility: Secondary | ICD-10-CM

## 2018-11-08 DIAGNOSIS — R600 Localized edema: Secondary | ICD-10-CM

## 2018-11-08 DIAGNOSIS — G8929 Other chronic pain: Secondary | ICD-10-CM

## 2018-11-08 NOTE — Therapy (Signed)
[place], [place], [address] Phone: [phone]   Fax:  [phone]  Physical Therapy Treatment  Patient Details  Name: Wanda Mcintosh MRN: [id number] Date of Birth: 1965-07-17 Referring Provider (PT): Marchia Bond   Encounter Date: 11/08/2018  PT End of Session - 11/08/18 1641    Visit Number  3    Number of Visits  12    Date for PT Re-Evaluation  12/04/18    Authorization Type  MCR: kx mod at 15th visit, progress note at 10th    PT Start Time  1637    PT Stop Time  1718    PT Time Calculation (min)  41 min    Activity Tolerance  Patient tolerated treatment well       Past Medical History:  Diagnosis Date  . Anemia   . Back injury   . Barrett's esophagus   . Cancer ([place])    cervica-l leep  . Chronic back pain   . Complication of anesthesia    possible aggression coming out of anesthesia  . Gallstones   . GERD (gastroesophageal reflux disease)   . Head ache   . Migraine   . Osteoarthritis   . Status post dilation of esophageal narrowing   . Vertigo     Past Surgical History:  Procedure Laterality Date  . BREAST SURGERY Bilateral    rt fibroid 90. lft 94  . CARPAL TUNNEL RELEASE Left   . CARPAL TUNNEL RELEASE Right 12/2016  . CHOLECYSTECTOMY  07/16/2011   Procedure: LAPAROSCOPIC CHOLECYSTECTOMY WITH INTRAOPERATIVE CHOLANGIOGRAM;  Surgeon: Merrie Roof, MD;  Location: [place];  Service: General;  Laterality: N/A;  . HAND SURGERY     cartilage  . LEEP    . TONSILLECTOMY      There were no vitals filed for this visit.  Subjective Assessment - 11/08/18 1641    Subjective  " I still have some issue with stairs and pain is about 5/10, but I did just start being able to do stairs reciprocally    Patient Stated Goals  be more functoinal to move without pain    Currently in Pain?  Yes    Pain Score  5    last took meds for pain at 3 pm   Pain Location  Knee    Pain Orientation   Right;Left         [facility] PT Assessment - 11/08/18 0001      Assessment   Medical Diagnosis  R knee PFPS, and L knee OA                   [facility] Adult PT Treatment/Exercise - 11/08/18 0001      Self-Care   Self-Care  Other Self-Care Comments    Other Self-Care Comments   MPTR techniques to calm down muscle stiffness      Knee/Hip Exercises: Aerobic   Nustep  L5 x 5 min LE only      Knee/Hip Exercises: Standing   Forward Step Up  2 sets;10 reps    Stairs  up/down 6 inch steps keeping knee in line with the foot.   x 4     Manual Therapy   Manual therapy comments  MTPR along the vastus lateralis bil x 3    Soft tissue mobilization  IASTM bilat VL    McConnell  bil knee L>M pull  PT Short Term Goals - 10/23/18 1149      PT SHORT TERM GOAL #1   Title  pt to be I with inital HEP    Period  Weeks    Status  New    Target Date  11/13/18        PT Long Term Goals - 10/23/18 1156      PT LONG TERM GOAL #1   Title  increase R knee extension to within 3 degrees to promote functional ROM required for efficient gait pattern    Time  6    Period  Weeks    Status  New    Target Date  12/04/18      PT LONG TERM GOAL #2   Title  increase bil hip abductor/ flexor strength to ./=4+/5 bil to promote hip/ knee stability for walking / standing    Time  6    Status  New    Target Date  12/04/18      PT LONG TERM GOAL #3   Title  pt to be able to stand/ walk >/= 30 min and navigate 12 steps reciprocally with no report of pain or limitatoins for functional mobility    Time  6    Period  Weeks    Status  New    Target Date  12/04/18      PT LONG TERM GOAL #4   Title  increase FOTO score to </= 52% limited to demo improvement in function    Time  6    Period  Weeks    Status  New    Target Date  12/04/18      PT LONG TERM GOAL #5   Title  pt to be I with all HEP given as of last visit to maintain and progress current level of function     Time  6    Period  Weeks    Status  New    Target Date  12/04/18            Plan - 11/08/18 1726    Clinical Impression Statement  continued applying McConnel taping to bil knees and perofrmed MTPR and STW along the VL bil. continued strengtheing to promote patellar stability, and practiced stair training which she had poor self proproception/ positional awareness requiring verbal cues/ tactile cues for proper form.    PT Treatment/Interventions  ADLs/Self Care Home Management;Cryotherapy;Stair training;Functional mobility training;Gait training;Therapeutic exercise;Therapeutic activities;Balance training;Neuromuscular re-education;Manual techniques;Taping;Vasopneumatic Device;Dry needling;Patient/family education    PT Next Visit Plan  continue STW & taping PRN, stability training, stair training    PT Home Exercise Plan  bridge with ball squeeze, quad set with ball squeeze, sidelying hipo abduction, heel raise    Consulted and Agree with Plan of Care  Patient       Patient will benefit from skilled therapeutic intervention in order to improve the following deficits and impairments:  Abnormal gait, Pain, Obesity, Decreased activity tolerance, Decreased endurance, Decreased range of motion, Increased fascial restricitons, Decreased strength, Improper body mechanics, Postural dysfunction  Visit Diagnosis: 1. Chronic pain of left knee   2. Chronic pain of right knee   3. Muscle weakness (generalized)   4. Other abnormalities of gait and mobility   5. Localized edema        Problem List Patient Active Problem List   Diagnosis Date Noted  . Influenza A 05/05/2018  . Nonspecific abnormal electrocardiogram (ECG) (EKG) 05/05/2018  . Hyperglycemia 05/05/2018  .  Acute respiratory failure with hypoxia ([place]) 05/05/2018  . Hypokalemia 05/05/2018  . Cholelithiasis 06/21/2011   Starr Lake PT, DPT, LAT, ATC  11/08/18  5:30 PM      [place]  [facility] [address] [place], [place], [address] Phone: [phone]   Fax:  [phone]  Name: Wanda Mcintosh MRN: [id number] Date of Birth: 01/07/1966

## 2018-11-13 ENCOUNTER — Ambulatory Visit: Payer: Medicare HMO | Admitting: Physical Therapy

## 2018-11-13 ENCOUNTER — Telehealth: Payer: Self-pay | Admitting: Physical Therapy

## 2018-11-13 NOTE — Telephone Encounter (Signed)
Spoke with pt about missed appointment today, She stated she called 3 separate times and was unable to get through so she left a message that she had to cancel. She noted she may have difficulty making her next appointments and would call tomorrow to address re-scheduling them.

## 2018-11-16 ENCOUNTER — Ambulatory Visit: Payer: Medicare HMO | Admitting: Physical Therapy

## 2018-11-20 ENCOUNTER — Ambulatory Visit: Payer: Medicare HMO | Admitting: Physical Therapy

## 2018-12-06 ENCOUNTER — Ambulatory Visit: Payer: Medicare HMO | Attending: Orthopedic Surgery | Admitting: Physical Therapy

## 2018-12-06 ENCOUNTER — Other Ambulatory Visit: Payer: Self-pay

## 2018-12-06 ENCOUNTER — Encounter: Payer: Self-pay | Admitting: Physical Therapy

## 2018-12-06 DIAGNOSIS — M25562 Pain in left knee: Secondary | ICD-10-CM | POA: Insufficient documentation

## 2018-12-06 DIAGNOSIS — R2689 Other abnormalities of gait and mobility: Secondary | ICD-10-CM | POA: Insufficient documentation

## 2018-12-06 DIAGNOSIS — M25561 Pain in right knee: Secondary | ICD-10-CM | POA: Diagnosis present

## 2018-12-06 DIAGNOSIS — M6281 Muscle weakness (generalized): Secondary | ICD-10-CM | POA: Insufficient documentation

## 2018-12-06 DIAGNOSIS — R600 Localized edema: Secondary | ICD-10-CM

## 2018-12-06 DIAGNOSIS — G8929 Other chronic pain: Secondary | ICD-10-CM | POA: Insufficient documentation

## 2018-12-06 NOTE — Therapy (Addendum)
[place], [place], [address] Phone: [phone]   Fax:  [phone]  Physical Therapy Treatment / Re-evaluation / discharge  Patient Details  Name: Wanda Mcintosh MRN: [id number] Date of Birth: 02/12/1966 Referring Provider (PT): Marchia Bond   Encounter Date: 12/06/2018  PT End of Session - 12/06/18 1605    Visit Number  4    Number of Visits  12    Date for PT Re-Evaluation  12/27/18    Authorization Type  MCR: kx mod at 15th visit, progress note at 10th    PT Start Time  1600   pt arrived 15 min late   PT Stop Time  1630    PT Time Calculation (min)  30 min    Activity Tolerance  Patient tolerated treatment well    Behavior During Therapy  [facility] for tasks assessed/performed       Past Medical History:  Diagnosis Date  . Anemia   . Back injury   . Barrett's esophagus   . 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 Chronic back pain   . Complication of anesthesia    possible aggression coming out of anesthesia  . Gallstones   . GERD (gastroesophageal reflux disease)   . Head ache   . Migraine   . Osteoarthritis   . Status post dilation of esophageal narrowing   . Vertigo     Past Surgical History:  Procedure Laterality Date  . BREAST SURGERY Bilateral    rt fibroid 90. lft 94  . CARPAL TUNNEL RELEASE Left   . CARPAL TUNNEL RELEASE Right 12/2016  . CHOLECYSTECTOMY  07/16/2011   Procedure: LAPAROSCOPIC CHOLECYSTECTOMY WITH INTRAOPERATIVE CHOLANGIOGRAM;  Surgeon: Merrie Roof, MD;  Location: [place];  Service: General;  Laterality: N/A;  . HAND SURGERY     cartilage  . LEEP    . TONSILLECTOMY      There were no vitals filed for this visit.  Subjective Assessment - 12/06/18 1601    Subjective  "I think the steroid injection in the back which helped with the knees but I think it is wearing off"    Currently in Pain?  Yes    Pain Score  3    with walking/ standing and stairs 7-8/10   Pain Orientation  Right;Left    Pain Descriptors / Indicators  Aching;Sore    Pain Type  Chronic pain    Pain Onset  More than a month ago    Pain Frequency  Intermittent    Aggravating Factors   standing/ walking stairs and getting out of a chair.    Pain Relieving Factors  laying down         [facility] PT Assessment - 12/06/18 0001      Assessment   Medical Diagnosis  R knee PFPS, and L knee OA    Referring Provider (PT)  Marchia Bond      AROM   Right Knee Extension  0    Right Knee Flexion  142      Strength   Right Hip ABduction  4/5    Left Hip ABduction  4/5    Right Knee Flexion  4+/5    Right Knee Extension  4+/5    Left Knee Flexion  5/5    Left Knee Extension  5/5                   [facility] Adult PT Treatment/Exercise -  12/06/18 0001      Knee/Hip Exercises: Standing   Stairs  navigating up/ down 6 inch step reciprocally with bil HHA demonstrating good form with intermittent occurence of R foot eversion that she corrected without cues      Manual Therapy   McConnell  R knee taping L>M   taught pt how to apply tape            PT Education - 12/06/18 1637    Education Details  reviewed HEP and updated for maching strengthening exercise that can be performed at the [facility]. taught how to apply McConnel tape and handout on where she can purchase tape.    Person(s) Educated  Patient    Methods  Explanation;Verbal cues;Handout    Comprehension  Verbalized understanding;Verbal cues required       PT Short Term Goals - 12/06/18 1605      PT SHORT TERM GOAL #1   Title  pt to be I with inital HEP    Period  Weeks    Status  Achieved        PT Long Term Goals - 12/06/18 1606      PT LONG TERM GOAL #1   Title  increase R knee extension to within 3 degrees to promote functional ROM required for efficient gait pattern    Time  6    Period  Weeks    Status  Achieved      PT LONG TERM GOAL #2   Title  increase bil hip abductor/ flexor strength  to ./=4+/5 bil to promote hip/ knee stability for walking / standing    Time  6    Period  Weeks    Status  On-going      PT LONG TERM GOAL #3   Title  pt to be able to stand/ walk >/= 30 min and navigate 12 steps reciprocally with no report of pain or limitatoins for functional mobility    Baseline  able to navigate steps but reports pain    Period  Weeks    Status  On-going      PT LONG TERM GOAL #4   Title  increase FOTO score to </= 52% limited to demo improvement in function    Baseline  -    Time  6    Period  Weeks    Status  On-going      PT LONG TERM GOAL #5   Title  pt to be I with all HEP given as of last visit to maintain and progress current level of function    Time  6    Period  Weeks    Status  On-going            Plan - 12/06/18 1639    Clinical Impression Statement  pt reports back to PT since her last appointment on 8/19. she reports increased soreness in the knees which she related to her cortisone injection inthe back wearing off. She has functional ROM in the knees and good strength with mild soreness noted during testing. she is demonstrating improvemen tin her goals despite reporting increased soreness today. applied McConnel taping and where she can purchase the tape. pt asked for exercises that can be done at the [facility] and potentially transition to the gym to continue exercises. plan to see pt back for 1 more visit to assess response to the gym and update HEP and address any remaining questions.    PT Frequency  2x /  week    PT Duration  3 weeks    PT Treatment/Interventions  ADLs/Self Care Home Management;Cryotherapy;Stair training;Functional mobility training;Gait training;Therapeutic exercise;Therapeutic activities;Balance training;Neuromuscular re-education;Manual techniques;Taping;Vasopneumatic Device;Dry needling;Patient/family education    PT Next Visit Plan  continue STW & taping PRN, stability training, stair training, review HEP/ teach taping  technique    PT Home Exercise Plan  bridge with ball squeeze, quad set with ball squeeze, sidelying hipo abduction, heel raise    Consulted and Agree with Plan of Care  Patient       Patient will benefit from skilled therapeutic intervention in order to improve the following deficits and impairments:  Abnormal gait, Pain, Obesity, Decreased activity tolerance, Decreased endurance, Decreased range of motion, Increased fascial restricitons, Decreased strength, Improper body mechanics, Postural dysfunction  Visit Diagnosis: Chronic pain of left knee  Chronic pain of right knee  Muscle weakness (generalized)  Other abnormalities of gait and mobility  Localized edema     Problem List Patient Active Problem List   Diagnosis Date Noted  . Influenza A 05/05/2018  . Nonspecific abnormal electrocardiogram (ECG) (EKG) 05/05/2018  . Hyperglycemia 05/05/2018  . Acute respiratory failure with hypoxia ([place]) 05/05/2018  . Hypokalemia 05/05/2018  . Cholelithiasis 06/21/2011   [place] PT, DPT, LAT, ATC  12/06/18  4:46 PM      [facility] [address] [place], [place], [address] Phone: [phone]   Fax:  [phone]  Name: Wanda Mcintosh MRN: [id number] Date of Birth: 08-Mar-1966     PHYSICAL THERAPY DISCHARGE SUMMARY  Visits from Start of Care: 4  Current functional level related to goals / functional outcomes: See goals   Remaining deficits: Unknown due to pt not returning   Education / Equipment: HEP, theraband, posture/ lifting mechanics  Plan: Patient agrees to discharge.  Patient goals were partially met. Patient is being discharged due to not returning since the last visit.  ?????         Saunders [place] PT, DPT, LAT, ATC  01/03/19  1:54 PM

## 2018-12-11 ENCOUNTER — Ambulatory Visit: Payer: Medicare HMO | Admitting: Physical Therapy

## 2018-12-21 ENCOUNTER — Other Ambulatory Visit: Payer: Self-pay | Admitting: Physician Assistant

## 2018-12-21 DIAGNOSIS — N644 Mastodynia: Secondary | ICD-10-CM

## 2018-12-21 DIAGNOSIS — N6310 Unspecified lump in the right breast, unspecified quadrant: Secondary | ICD-10-CM

## 2018-12-22 ENCOUNTER — Ambulatory Visit
Admission: RE | Admit: 2018-12-22 | Discharge: 2018-12-22 | Disposition: A | Discharge status: Home / Self Care | Payer: Medicare HMO | Source: Ambulatory Visit | Attending: Physician Assistant | Admitting: Physician Assistant

## 2018-12-22 ENCOUNTER — Other Ambulatory Visit: Payer: Medicare HMO

## 2018-12-22 ENCOUNTER — Other Ambulatory Visit: Payer: Self-pay

## 2018-12-22 DIAGNOSIS — N644 Mastodynia: Secondary | ICD-10-CM

## 2018-12-22 DIAGNOSIS — N6310 Unspecified lump in the right breast, unspecified quadrant: Secondary | ICD-10-CM

## 2018-12-28 ENCOUNTER — Ambulatory Visit: Payer: Medicare HMO | Attending: Orthopedic Surgery | Admitting: Physical Therapy

## 2019-02-23 ENCOUNTER — Other Ambulatory Visit: Payer: Self-pay

## 2019-02-23 ENCOUNTER — Emergency Department (HOSPITAL_COMMUNITY)
Admission: EM | Admit: 2019-02-23 | Discharge: 2019-02-23 | Disposition: A | Discharge status: Home / Self Care | Payer: Medicare HMO | Attending: Emergency Medicine | Admitting: Emergency Medicine

## 2019-02-23 DIAGNOSIS — I10 Essential (primary) hypertension: Secondary | ICD-10-CM | POA: Insufficient documentation

## 2019-02-23 DIAGNOSIS — Z5321 Procedure and treatment not carried out due to patient leaving prior to being seen by health care provider: Secondary | ICD-10-CM | POA: Diagnosis not present

## 2019-02-23 DIAGNOSIS — R61 Generalized hyperhidrosis: Secondary | ICD-10-CM | POA: Diagnosis not present

## 2019-02-23 NOTE — ED Triage Notes (Signed)
Pt here for continued hypertension after being put on Lisinopril by PCP within the week. Pt sts her bp was 240/120 this morning. Pt denies chest pain, reports improvement in headaches she used to have. Pt sweating in triage but sts this is normal for her, as she is going through menopause.

## 2019-06-12 ENCOUNTER — Encounter (HOSPITAL_COMMUNITY): Payer: Self-pay | Admitting: Emergency Medicine

## 2019-06-12 ENCOUNTER — Ambulatory Visit (HOSPITAL_COMMUNITY)
Admission: EM | Admit: 2019-06-12 | Discharge: 2019-06-12 | Disposition: A | Discharge status: Home / Self Care | Payer: Medicare HMO

## 2019-06-12 ENCOUNTER — Other Ambulatory Visit: Payer: Self-pay

## 2019-06-12 DIAGNOSIS — Z23 Encounter for immunization: Secondary | ICD-10-CM

## 2019-06-12 DIAGNOSIS — M79644 Pain in right finger(s): Secondary | ICD-10-CM

## 2019-06-12 DIAGNOSIS — S61011A Laceration without foreign body of right thumb without damage to nail, initial encounter: Secondary | ICD-10-CM | POA: Diagnosis not present

## 2019-06-12 MED ORDER — TETANUS-DIPHTH-ACELL PERTUSSIS 5-2.5-18.5 LF-MCG/0.5 IM SUSP
0.5000 mL | Freq: Once | INTRAMUSCULAR | Status: AC
  Administered 2019-06-12: 0.5 mL via INTRAMUSCULAR

## 2019-06-12 MED ORDER — TETANUS-DIPHTH-ACELL PERTUSSIS 5-2.5-18.5 LF-MCG/0.5 IM SUSP
INTRAMUSCULAR | Status: AC
  Filled 2019-06-12: qty 0.5

## 2019-06-12 NOTE — ED Provider Notes (Signed)
[place]hel[place]   MRN: [id number] DOB: 07/13/1965  Subjective:   Wanda Mcintosh is a 54 y.o. female presenting for suffering an acute laceration to right thumb from epoxy today.  Last Tdap is unknown.  Patient has cleaned her wound and wrapped it.  No current facility-administered medications for this encounter.  Current Outpatient Medications:  .  DULoxetine (CYMBALTA) 60 MG capsule, TAKE 1 CAPSULE EVERY DAY (Patient taking differently: Take 60 mg by mouth daily. ), Disp: 90 capsule, Rfl: 1 .  HYDROcodone-acetaminophen (NORCO) 7.5-325 MG tablet, Take 1 tablet by mouth 3 (three) times daily as needed for moderate pain. , Disp: , Rfl:  .  omeprazole (PRILOSEC) 20 MG capsule, Take 20 mg by mouth daily., Disp: , Rfl:  .  AMBULATORY NON FORMULARY MEDICATION, Medication Name: Diltiazem 2% gel with 5% Lidocaine Apply a pea sized amount internally four times daily. Dispense 30 GM zero refill, Disp: 30 g, Rfl: 0 .  aspirin-acetaminophen-caffeine (EXCEDRIN MIGRAINE) 250-250-65 MG tablet, Take by mouth every 6 (six) hours as needed for headache., Disp: , Rfl:  .  benzonatate (TESSALON) 200 MG capsule, Take 1 capsule (200 mg total) by mouth 3 (three) times daily as needed for cough. (Patient not taking: Reported on 10/23/2018), Disp: 20 capsule, Rfl: 0 .  cyclobenzaprine (FLEXERIL) 10 MG tablet, Take 10 mg by mouth 2 (two) times daily., Disp: , Rfl:  .  diclofenac sodium (VOLTAREN) 1 % GEL, Apply 2 g topically 4 (four) times daily., Disp: 900 g, Rfl: 0 .  estradiol (VIVELLE-DOT) 0.1 MG/24HR patch, Place 1 patch onto the skin 2 (two) times a week., Disp: , Rfl: 12 .  lisinopril (ZESTRIL) 5 MG tablet, Take 5 mg by mouth daily., Disp: , Rfl:  .  predniSONE (DELTASONE) 10 MG tablet, Take 4 tabs for 3 days, then 3 tabs for 3 days, then 2 tabs for 3 days, then 1 tab for 3 days, then 1/2 tab for 4 days. (Patient not taking: Reported on 10/23/2018), Disp: 32 tablet, Rfl: 0 .  tiZANidine (ZANAFLEX) 4 MG  tablet, TAKE 1/2 TABLET TWICE DAILY AS NEEDED FOR MUSCLE SPASM(S) (Patient not taking: Reported on 10/23/2018), Disp: 30 tablet, Rfl: 0   Allergies  Allergen Reactions  . Baclofen Other (See Comments) and Swelling    Lower limb swelling  . Celebrex [Celecoxib] Other (See Comments)    Excessive bloating GI pain Paleness Excessive sleeping Mood change (aggression)  . Diclofenac Sodium Other (See Comments)    elevated liver enzymes  . Flexeril [Cyclobenzaprine] Other (See Comments)    Other reaction(s): Headache Headache  Headache   . Gabapentin Swelling    Tears up stomach Feet swell  . Nsaids     Mobic tears up stomach.  . Ondansetron Hcl Other (See Comments)    Other reaction(s): Headache migraines  . Zofran Other (See Comments)    migraines    Past Medical History:  Diagnosis Date  . Anemia   . Back injury   . Barrett's esophagus   . 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 Chronic back pain   . Complication of anesthesia    possible aggression coming out of anesthesia  . Gallstones   . GERD (gastroesophageal reflux disease)   . Head ache   . Migraine   . Osteoarthritis   . Status post dilation of esophageal narrowing   . Vertigo      Past Surgical History:  Procedure Laterality Date  . BREAST EXCISIONAL BIOPSY Bilateral   .  BREAST SURGERY Bilateral    rt fibroid 90. lft 94  . CARPAL TUNNEL RELEASE Left   . CARPAL TUNNEL RELEASE Right 12/2016  . CHOLECYSTECTOMY  07/16/2011   Procedure: LAPAROSCOPIC CHOLECYSTECTOMY WITH INTRAOPERATIVE CHOLANGIOGRAM;  Surgeon: Merrie Roof, MD;  Location: [facility];  Service: General;  Laterality: N/A;  . HAND SURGERY     cartilage  . LEEP    . TONSILLECTOMY      Family History  Problem Relation Age of Onset  . Hypertension Mother   . Breast cancer Mother 46  . Colon polyps Father   . Hypertension Father   . Diabetes Maternal Grandmother   . Diabetes Paternal Grandmother   . Prostate cancer Paternal Grandfather   .  Diabetes Maternal Aunt   . Irritable bowel syndrome Son   . Colon polyps Paternal Uncle   . Prostate cancer Paternal Uncle   . Colon polyps Paternal Uncle   . Colon cancer Neg Hx   . Esophageal cancer Neg Hx   . Stomach cancer Neg Hx     Social History   Tobacco Use  . Smoking status: Never Smoker  . Smokeless tobacco: Never Used  . Tobacco comment: smoked in 7yh grade, occ alcohol  Substance Use Topics  . Alcohol use: Not Currently    Comment: occasional  . Drug use: No    ROS   Objective:   Vitals: BP 102/70 (BP Location: Right Arm) Comment (BP Location): large cuff  Pulse 84   Temp 98.2 F (36.8 C) (Oral)   Resp 20   SpO2 96%   Physical Exam Constitutional:      General: She is not in acute distress.    Appearance: Normal appearance. She is well-developed. She is obese. She is not ill-appearing, toxic-appearing or diaphoretic.  HENT:     Head: Normocephalic and atraumatic.     Nose: Nose normal.     Mouth/Throat:     Mouth: Mucous membranes are moist.     Pharynx: Oropharynx is clear.  Eyes:     General: No scleral icterus.    Extraocular Movements: Extraocular movements intact.     Pupils: Pupils are equal, round, and reactive to light.  Cardiovascular:     Rate and Rhythm: Normal rate.  Pulmonary:     Effort: Pulmonary effort is normal.  Musculoskeletal:       Hands:  Skin:    General: Skin is warm and dry.  Neurological:     General: No focal deficit present.     Mental Status: She is alert and oriented to person, place, and time.  Psychiatric:        Mood and Affect: Mood normal.        Behavior: Behavior normal.        Thought Content: Thought content normal.        Judgment: Judgment normal.     Assessment and Plan :   1. Laceration of right thumb without foreign body without damage to nail, initial encounter   2. Pain of right thumb     Wound approximated with Dermabond. Wound care reviewed. APAP for pain or regular pain medication.  Counseled patient on potential for adverse effects with medications prescribed/recommended today, ER and return-to-clinic precautions discussed, patient verbalized understanding.    Jaynee Eagles, [place] 06/12/19 1946

## 2019-06-12 NOTE — ED Triage Notes (Signed)
Laceration to right thumb occurred around 4:50 pm today.  Cut was made by a new burn barrel.  Bleeding controlled.  Unknown when last tetanus was received.  States this is her primary recent for coming today

## 2019-08-29 ENCOUNTER — Ambulatory Visit (INDEPENDENT_AMBULATORY_CARE_PROVIDER_SITE_OTHER): Payer: Medicare HMO | Admitting: Gastroenterology

## 2019-08-29 ENCOUNTER — Encounter: Payer: Self-pay | Admitting: Gastroenterology

## 2019-08-29 ENCOUNTER — Other Ambulatory Visit (INDEPENDENT_AMBULATORY_CARE_PROVIDER_SITE_OTHER): Payer: Medicare HMO

## 2019-08-29 VITALS — BP 110/76 | HR 89 | Ht 68.0 in | Wt 278.6 lb

## 2019-08-29 DIAGNOSIS — R140 Abdominal distension (gaseous): Secondary | ICD-10-CM | POA: Diagnosis not present

## 2019-08-29 DIAGNOSIS — K760 Fatty (change of) liver, not elsewhere classified: Secondary | ICD-10-CM | POA: Diagnosis not present

## 2019-08-29 DIAGNOSIS — R194 Change in bowel habit: Secondary | ICD-10-CM | POA: Diagnosis not present

## 2019-08-29 LAB — IGA: IgA: 134 mg/dL (ref 68–378)

## 2019-08-29 NOTE — Progress Notes (Signed)
HPI :  54 year old female here for follow-up visit.  She has a history of GERD for which have seen her in the past, as well as fatty liver.  She had been doing okay in general, had an episode in February she thought was due to food poisoning.  She had cramps in her abdomen as well as significant loose stools which resolved on its own and eventually felt better after a week or 2.  Unfortunately again she then developed another episode after she ate.  She felt a lot of bloating and tenderness in her epigastric area which radiated to her lower abdomen.  She took omeprazole, peppermint, simethicone, and dicyclomine which all helped her, the symptoms lasted a few weeks and then resolved.  She is been doing okay without significant pain recently but has a lot of gas and bloating that has been bothering her.  She is using simethicone as needed which helps.  Her bowels go from hard stools to loose stools at times, she states they occur equally about 50-50.  She had used fiber previously but concerned about increased gas using psyllium and has avoided it.  Her hemorrhoids have been improved by fiber in the past but have been a bit more irritated lately.  She notices some scant blood in the toilet paper from the hemorrhoids, no other irritation or prolapse.  She has had symptoms of hemorrhoids she states for several years.  I offered to do rectal exam to evaluate the hemorrhoids today which she declined.  Her last colonoscopy was in 2015.  She has a history of fatty liver of noted and is status post cholecystectomy.  She only rarely drinks alcohol.  She has had some fluctuating liver enzyme levels in the past.  She had lab work done on May 3 showing an ALT of 80, AST of 65, alk phos 71, total bili 0.5.  Hemoglobin 15.4, white blood cell count 6.5, platelet count 237.  In the past she had some elevated liver enzymes transiently she thought due to diclofenac use.  She had negative autoimmune testing in 2019, prior  iron testing done.  Endoscopic history: EGD 02/07/2014 - normal exam, biopsies taken of the GEJ which showed no evidence of Barrett's esophagus Colonoscopy 02/07/2014 - diverticulosis, otherwise normal colon. Biopsies show no evidence of microscopic colitis. EGD 07/01/2017 - normal esophagus, no Barrett's, mild gastritis - HP negative  Past Medical History:  Diagnosis Date  . Anemia   . Back injury   . Barrett's esophagus   . Cancer ([place])    cervica-l leep  . Chronic back pain   . Complication of anesthesia    possible aggression coming out of anesthesia  . Gallstones   . GERD (gastroesophageal reflux disease)   . Head ache   . Migraine   . Osteoarthritis   . Status post dilation of esophageal narrowing   . Vertigo      Past Surgical History:  Procedure Laterality Date  . BREAST EXCISIONAL BIOPSY Bilateral   . BREAST SURGERY Bilateral    rt fibroid 90. lft 94  . CARPAL TUNNEL RELEASE Left   . CARPAL TUNNEL RELEASE Right 12/2016  . CHOLECYSTECTOMY  07/16/2011   Procedure: LAPAROSCOPIC CHOLECYSTECTOMY WITH INTRAOPERATIVE CHOLANGIOGRAM;  Surgeon: Merrie Roof, MD;  Location: [place];  Service: General;  Laterality: N/A;  . HAND SURGERY     cartilage  . LEEP    . TONSILLECTOMY     Family History  Problem Relation Age of  Onset  . Hypertension Mother   . Breast cancer Mother 79  . Colon polyps Father   . Hypertension Father   . Diabetes Maternal Grandmother   . Diabetes Paternal Grandmother   . Prostate cancer Paternal Grandfather   . Diabetes Maternal Aunt   . Irritable bowel syndrome Son   . Colon polyps Paternal Uncle   . Prostate cancer Paternal Uncle   . Colon polyps Paternal Uncle   . Colon cancer Neg Hx   . Esophageal cancer Neg Hx   . Stomach cancer Neg Hx    Social History   Tobacco Use  . Smoking status: Never Smoker  . Smokeless tobacco: Never Used  . Tobacco comment: smoked in 7yh grade, occ alcohol  Substance Use Topics  . Alcohol use: Not  Currently    Comment: occasional  . Drug use: No   Current Outpatient Medications  Medication Sig Dispense Refill  . AMBULATORY NON FORMULARY MEDICATION Medication Name: Diltiazem 2% gel with 5% Lidocaine Apply a pea sized amount internally four times daily. Dispense 30 GM zero refill 30 g 0  . aspirin-acetaminophen-caffeine (EXCEDRIN MIGRAINE) 250-250-65 MG tablet Take by mouth every 6 (six) hours as needed for headache.    . DULoxetine (CYMBALTA) 60 MG capsule TAKE 1 CAPSULE EVERY DAY (Patient taking differently: Take 60 mg by mouth daily. ) 90 capsule 1  . HYDROcodone-acetaminophen (NORCO) 7.5-325 MG tablet Take 1 tablet by mouth 3 (three) times daily as needed for moderate pain.     Marland Kitchen lisinopril (ZESTRIL) 5 MG tablet Take 5 mg by mouth daily.    Marland Kitchen omeprazole (PRILOSEC) 20 MG capsule Take 20 mg by mouth 2 (two) times daily before a meal.     . rOPINIRole (REQUIP) 0.25 MG tablet Take 0.25 mg by mouth at bedtime.    . cyclobenzaprine (FLEXERIL) 10 MG tablet Take 10 mg by mouth 2 (two) times daily.    . diclofenac sodium (VOLTAREN) 1 % GEL Apply 2 g topically 4 (four) times daily. (Patient not taking: Reported on 08/29/2019) 900 g 0  . dicyclomine (BENTYL) 20 MG tablet Take 20 mg by mouth as needed.    . meclizine (ANTIVERT) 25 MG tablet Take 1 tablet by mouth as needed.     No current facility-administered medications for this visit.   Allergies  Allergen Reactions  . Baclofen Other (See Comments) and Swelling    Lower limb swelling  . Celebrex [Celecoxib] Other (See Comments)    Excessive bloating GI pain Paleness Excessive sleeping Mood change (aggression)  . Diclofenac Sodium Other (See Comments)    elevated liver enzymes  . Flexeril [Cyclobenzaprine] Other (See Comments)    Other reaction(s): Headache Headache  Headache   . Gabapentin Swelling    Tears up stomach Feet swell  . Nsaids     Mobic tears up stomach.  . Ondansetron Hcl Other (See Comments)    Other  reaction(s): Headache migraines  . Zofran Other (See Comments)    migraines     Review of Systems: All systems reviewed and negative except where noted in HPI.    Recent labs per HPI as above  Physical Exam: BP 110/76   Pulse 89   Ht 5' 8" (1.727 m)   Wt 278 lb 9.6 oz (126.4 kg)   SpO2 98%   BMI 42.36 kg/m  Constitutional: Pleasant,well-developed, female in no acute distress. Pulmonary/chest: Effort normal and breath sounds normal. No wheezing, rales or rhonchi. Abdominal: Soft, nondistended, nontender. There are  no masses palpable.  Extremities: no edema Lymphadenopathy: No cervical adenopathy noted. Neurological: Alert and oriented to person place and time. Skin: Skin is warm and dry. No rashes noted. Psychiatric: Normal mood and affect. Behavior is normal.   ASSESSMENT AND PLAN: 54 year old female here for reassessment of the following:  Bloating / altered bowel habits - some ongoing abdominal bloating with altered bowel habits after few acute episodes as outlined above.  We discussed intestinal gas and bloating.  Her labs look okay as above, her colonoscopy is up-to-date.  Unclear if dietary intake is contributing to some of her bloating, provided her handout on low FODMAP diet to see if that will help.  I will also screen her for celiac disease with serology to ensure normal.  She will continue Bentyl and simethicone as needed if that helps.  Recommend she use some Citrucel once daily to provide some regularity to her bowels without causing bloating like other fibers can.  We will see how she does on this regimen, told to call me in few weeks if not feeling better.  She agreed.  Of note patient has longstanding hemorrhoids, is not interested in evaluation for that today, she would likely do better with fiber supplementation and avoiding constipated state.  If bloating persists over time could consider empiric trial of rifaximin  Fatty liver - this is the likely cause of her  elevation in liver enzymes.  She is had most of her serologic evaluation done but I do not see testing for viral hepatitis.  We will send for this and check her immunity to hep a and B.  Otherwise discussed fatty liver in general, recommend goal weight loss to help treat this and she is working on it.  Also routine coffee intake can help reduce risk of fibrotic changes.  She should have liver enzymes checked at least yearly along with clinic visit with me for reassessment of this yearly   Sea Breeze Cellar, MD [facility]

## 2019-08-29 NOTE — Patient Instructions (Signed)
Your provider has requested that you go to the basement level for lab work before leaving today. Press "B" on the elevator. The lab is located at the first door on the left as you exit the elevator.  Start over the counter Citracel daily.   You have been given a low-fodmap diet to follow.   Due to recent changes in healthcare laws, you may see the results of your imaging and laboratory studies on MyChart before your provider has had a chance to review them.  We understand that in some cases there may be results that are confusing or concerning to you. Not all laboratory results come back in the same time frame and the provider may be waiting for multiple results in order to interpret others.  Please give [place] 48 hours in order for your provider to thoroughly review all the results before contacting the office for clarification of your results. \

## 2019-08-30 LAB — HEPATITIS C ANTIBODY
Hepatitis C Ab: NONREACTIVE
SIGNAL TO CUT-OFF: 0.01 (ref <1.00)

## 2019-08-30 LAB — HEPATITIS B SURFACE ANTIGEN: Hepatitis B Surface Ag: NONREACTIVE

## 2019-08-30 LAB — TISSUE TRANSGLUTAMINASE, IGA: (tTG) Ab, IgA: 1 U/mL

## 2019-08-30 LAB — HEPATITIS B SURFACE ANTIBODY,QUALITATIVE: Hep B S Ab: BORDERLINE — AB

## 2019-08-30 LAB — HEPATITIS A ANTIBODY, TOTAL: Hepatitis A AB,Total: NONREACTIVE

## 2019-08-31 ENCOUNTER — Other Ambulatory Visit: Payer: Self-pay

## 2019-08-31 MED ORDER — TWINRIX 720-20 ELU-MCG/ML IM SUSP: INTRAMUSCULAR | 2 refills | Status: AC

## 2019-12-18 ENCOUNTER — Ambulatory Visit: Payer: Medicare HMO | Admitting: Cardiology

## 2019-12-18 ENCOUNTER — Other Ambulatory Visit: Payer: Self-pay

## 2019-12-18 ENCOUNTER — Encounter: Payer: Self-pay | Admitting: Cardiology

## 2019-12-18 VITALS — BP 117/83 | HR 95 | Resp 16 | Ht 68.0 in | Wt 286.0 lb

## 2019-12-18 DIAGNOSIS — R0609 Other forms of dyspnea: Secondary | ICD-10-CM

## 2019-12-18 DIAGNOSIS — I10 Essential (primary) hypertension: Secondary | ICD-10-CM

## 2019-12-18 DIAGNOSIS — R002 Palpitations: Secondary | ICD-10-CM

## 2019-12-18 DIAGNOSIS — R9431 Abnormal electrocardiogram [ECG] [EKG]: Secondary | ICD-10-CM

## 2019-12-18 NOTE — Progress Notes (Signed)
Date:  12/18/2019   ID:  ZAREENA WILLIS, DOB 1965-07-15, MRN [id number]  PCP:  Bartholome Bill, MD  Cardiologist:  Rex Kras, DO, [facility] (established care 12/18/2019)  REASON FOR CONSULT: Exertional dyspnea  REQUESTING PHYSICIAN:  Bartholome Bill, MD [address],  [place] [address]  Chief Complaint  Patient presents with  . Shortness of Breath  . New Patient (Initial Visit)    HPI  Wanda Mcintosh is a 54 y.o. female who presents to the office with a chief complaint of " shortness of breath." Patient's past medical history and cardiovascular risk factors include: Hypertension, obesity due to excess calories.   She is referred to the office at the request of Bartholome Bill, MD for evaluation of exertional dyspnea.  Patient states that she has been having effort related dyspnea for the last 5-6 years.  It usually brought on by activity such as going up a flight of stairs, going uphill, mowing her lawn.  She states that she has not seek medical attention and the symptoms have been chronic and stable she has been attributing this to her underlying weight.  Patient denies exertional chest pain.  Recently patient also saw her primary care physician to be placed on Adderall as she plans to go back to school and she is having difficulty with concentration.  She also voiced that she been having palpitations recently with effort related activities.  Patient states that when she tries to get active such as walking she has noticed that her pulse increases up to 130 bpm.  Patient dates that she has to take a break otherwise she is concerned that she may pass out.  Patient has not had any episodes of exertional syncope.  No use of caffeinated beverages, alcohol, or illicit drugs.  She has been taking a supplement over-the-counter that she gets to the CBD store for the last 3 months sporadically it is called Kratom (supplement to increase energy).  No  family history of premature coronary disease or sudden cardiac death.  Denies prior history of coronary artery disease, myocardial infarction, congestive heart failure, deep venous thrombosis, pulmonary embolism, stroke, transient ischemic attack.  FUNCTIONAL STATUS: No structured exercise program or daily routine.   ALLERGIES: Allergies  Allergen Reactions  . Baclofen Other (See Comments) and Swelling    Lower limb swelling  . Celebrex [Celecoxib] Other (See Comments)    Excessive bloating GI pain Paleness Excessive sleeping Mood change (aggression)  . Diclofenac Sodium Other (See Comments)    elevated liver enzymes  . Flexeril [Cyclobenzaprine] Other (See Comments)    Other reaction(s): Headache Headache  Headache   . Gabapentin Swelling    Tears up stomach Feet swell  . Nsaids     Mobic tears up stomach.  . Ondansetron Hcl Other (See Comments)    Other reaction(s): Headache migraines  . Zofran Other (See Comments)    migraines    MEDICATION LIST PRIOR TO VISIT: Current Meds  Medication Sig  . AMBULATORY NON FORMULARY MEDICATION Medication Name: Diltiazem 2% gel with 5% Lidocaine Apply a pea sized amount internally four times daily. Dispense 30 GM zero refill  . aspirin-acetaminophen-caffeine (EXCEDRIN MIGRAINE) 250-250-65 MG tablet Take by mouth every 6 (six) hours as needed for headache.  . cyclobenzaprine (FLEXERIL) 10 MG tablet Take 10 mg by mouth 2 (two) times daily.  . diclofenac sodium (VOLTAREN) 1 % GEL Apply 2 g topically 4 (four) times daily.  . DULoxetine (  CYMBALTA) 60 MG capsule TAKE 1 CAPSULE EVERY DAY (Patient taking differently: Take 60 mg by mouth daily. )  . fluticasone (FLONASE) 50 MCG/ACT nasal spray 1 spray by Each Nare route 2 times daily.  Marland Kitchen HYDROcodone-acetaminophen (NORCO) 7.5-325 MG tablet Take 1 tablet by mouth 3 (three) times daily as needed for moderate pain.   Marland Kitchen lisinopril (ZESTRIL) 5 MG tablet Take 10 mg by mouth daily.  . meclizine  (ANTIVERT) 25 MG tablet Take 1 tablet by mouth as needed.  Marland Kitchen omeprazole (PRILOSEC) 20 MG capsule Take 20 mg by mouth 2 (two) times daily before a meal.   . rOPINIRole (REQUIP) 0.25 MG tablet Take 0.25 mg by mouth at bedtime.  . SUMAtriptan (IMITREX) 100 MG tablet Take 1 tablet by mouth as needed.  . [DISCONTINUED] dicyclomine (BENTYL) 20 MG tablet Take 20 mg by mouth as needed.  . [DISCONTINUED] valACYclovir (VALTREX) 500 MG tablet Take by mouth as needed.     PAST MEDICAL HISTORY: Past Medical History:  Diagnosis Date  . Anemia   . Back injury   . Barrett's esophagus   . 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 Chronic back pain   . Complication of anesthesia    possible aggression coming out of anesthesia  . Gallstones   . GERD (gastroesophageal reflux disease)   . Head ache   . Migraine   . Osteoarthritis   . Status post dilation of esophageal narrowing   . Vertigo     PAST SURGICAL HISTORY: Past Surgical History:  Procedure Laterality Date  . BREAST EXCISIONAL BIOPSY Bilateral   . BREAST SURGERY Bilateral    rt fibroid 90. lft 94  . CARPAL TUNNEL RELEASE Left   . CARPAL TUNNEL RELEASE Right 12/2016  . CHOLECYSTECTOMY  07/16/2011   Procedure: LAPAROSCOPIC CHOLECYSTECTOMY WITH INTRAOPERATIVE CHOLANGIOGRAM;  Surgeon: Merrie Roof, MD;  Location: [facility];  Service: General;  Laterality: N/A;  . HAND SURGERY     cartilage  . LEEP    . TONSILLECTOMY      FAMILY HISTORY: The patient family history includes Breast cancer (age of onset: 73) in her mother; Colon polyps in her father, paternal uncle, and paternal uncle; Diabetes in her maternal aunt, maternal grandmother, and paternal grandmother; Hypertension in her father and mother; Irritable bowel syndrome in her son; Prostate cancer in her paternal grandfather and paternal uncle.  SOCIAL HISTORY:  The patient  reports that she has never smoked. She has never used smokeless tobacco. She reports current alcohol use. She reports  that she does not use drugs.  REVIEW OF SYSTEMS: Review of Systems  Constitutional: Negative for chills and fever.  HENT: Negative for hoarse voice and nosebleeds.   Eyes: Negative for discharge, double vision and pain.  Cardiovascular: Positive for dyspnea on exertion. Negative for chest pain, claudication, leg swelling, near-syncope, orthopnea, palpitations, paroxysmal nocturnal dyspnea and syncope.  Respiratory: Negative for hemoptysis and shortness of breath.   Musculoskeletal: Negative for muscle cramps and myalgias.  Gastrointestinal: Negative for abdominal pain, constipation, diarrhea, hematemesis, hematochezia, melena, nausea and vomiting.  Neurological: Positive for difficulty with concentration. Negative for focal weakness and headaches.    PHYSICAL EXAM: Vitals with BMI 12/18/2019 08/29/2019 06/12/2019  Height _0  _1  -  Weight 286 lbs 278 lbs 10 oz -  BMI 34.3 56.86 -  Systolic 168 372 902  Diastolic 93 76 70  Pulse 95 89 84    CONSTITUTIONAL: Well-developed and well-nourished. No acute distress.  SKIN: Skin is warm and dry. No rash noted. No cyanosis. No pallor. No jaundice HEAD: Normocephalic and atraumatic.  EYES: No scleral icterus MOUTH/THROAT: Moist oral membranes.  NECK: No JVD present. No thyromegaly noted. No carotid bruits  LYMPHATIC: No visible cervical adenopathy.  CHEST Normal respiratory effort. No intercostal retractions  LUNGS: Clear to auscultation bilaterally. No stridor. No wheezes. No rales.  CARDIOVASCULAR: Regular rate and rhythm, positive W3-S9, Soft holosystolic murmur heard at the left lower sternal border,no rubs or gallops appreciated. ABDOMINAL: Obese, soft, nontender, nondistended, positive bowel sounds all 4 quadrants. No apparent ascites.  EXTREMITIES: No peripheral edema  HEMATOLOGIC: No significant bruising NEUROLOGIC: Oriented to person, place, and time. Nonfocal. Normal muscle tone.  PSYCHIATRIC: Normal mood and affect. Normal  behavior. Cooperative  CARDIAC DATABASE: EKG: 12/18/2019: Sinus  Rhythm, 90bpm, PRWP, low voltage -possible pulmonary disease, without underlying ischemia or injury pattern.   Echocardiogram: No results found for this or any previous visit from the past 1095 days.   Stress Testing: No results found for this or any previous visit from the past 1095 days.  Heart Catheterization: None  LABORATORY DATA: CBC Latest Ref Rng & Units 05/05/2018 05/04/2018 10/20/2012  WBC 4.0 - 10.5 K/uL 4.7 4.2 5.8  Hemoglobin 12.0 - 15.0 g/dL 14.4 15.0 15.2(H)  Hematocrit 36 - 46 % 43.0 46.4(H) 42.2  Platelets 150 - 400 K/uL 147(L) 162 229    CMP Latest Ref Rng & Units 05/06/2018 05/05/2018 05/04/2018  Glucose 70 - 99 mg/dL 191(H) 324(H) 110(H)  BUN 6 - 20 mg/dL _0 Creatinine 0.44 - 1.00 mg/dL 0.74 0.97 0.92  Sodium 135 - 145 mmol/L 143 138 138  Potassium 3.5 - 5.1 mmol/L 5.0 3.0(L) 3.8  Chloride 98 - 111 mmol/L 104 102 103  CO2 22 - 32 mmol/L _1 Calcium 8.9 - 10.3 mg/dL 9.2 8.4(L) 9.0  Total Protein 6.5 - 8.1 g/dL - - 6.6  Total Bilirubin 0.3 - 1.2 mg/dL - - 0.7  Alkaline Phos 38 - 126 U/L - - 53  AST 15 - 41 U/L - - 40  ALT 0 - 44 U/L - - 47(H)    Lipid Panel  No results found for: CHOL, TRIG, HDL, CHOLHDL, VLDL, LDLCALC, LDLDIRECT, LABVLDL  No components found for: NTPROBNP No results for input(s): PROBNP in the last 8760 hours. No results for input(s): TSH in the last 8760 hours.  BMP No results for input(s): NA, K, CL, CO2, GLUCOSE, BUN, CREATININE, CALCIUM, GFRNONAA, GFRAA in the last 8760 hours.  HEMOGLOBIN A1C No results found for: HGBA1C, MPG   External Labs: Collected: Jul 23, 2019 Creatinine 0.87 mg/dL. eGFR: 77 mL/min per 1.73 m AST 65, ALT 80, alkaline phosphatase 71  IMPRESSION:    ICD-10-CM   1. Dyspnea on exertion  R06.00 EKG 12-Lead    Pro b natriuretic peptide (BNP)    CMP14+EGFR    PCV ECHOCARDIOGRAM COMPLETE    PCV MYOCARDIAL PERFUSION WITH LEXISCAN   2. Nonspecific abnormal electrocardiogram (ECG) (EKG)  R94.31 PCV MYOCARDIAL PERFUSION WITH LEXISCAN  3. Palpitations  R00.2 LONG TERM MONITOR (3-14 DAYS)    TSH  4. Benign hypertension  I10   5. Class 3 severe obesity due to excess calories without serious comorbidity with body mass index (BMI) of 40.0 to 44.9 in adult [facility])  E66.01    Z68.41      RECOMMENDATIONS: Wanda Mcintosh is a 54 y.o. female whose past medical history and cardiac risk  factors include: Hypertension, obesity due to excess calories.   Dyspnea on exertion:  Patient symptoms of dyspnea on exertion have been going on for the last 5 to 6 years according to her.  Symptoms are not increase in intensity, frequency, evaluation.  She has been attributing her symptoms to her underlying weight.  She has multiple cardiovascular risk factors and nonspecific EKG changes.  Recommend ischemic evaluation.  Patient states that she is unable to exercise on a treadmill.  Nuclear stress test recommended to evaluate for reversible ischemia.  Echocardiogram will be ordered to evaluate for structural heart disease and left ventricular systolic function.  Medications reconciled  Check BNP  Palpitations:  Check TSH  7 day extended Holter monitor to evaluate for underlying dysrhythmias.  Benign essential hypertension . Office blood pressure is at goal.  . Medication reconciled.  . Low salt diet recommended. A diet that is rich in fruits, vegetables, legumes, and low-fat dairy products and low in snacks, sweets, and meats (such as the Dietary Approaches to Stop Hypertension [DASH] diet).  . Currently managed by primary care provider.  Patient states that she has a physical coming up later this week.  She will have blood work done at her PCPs office and have a copy sent to [place] for reference.  FINAL MEDICATION LIST END OF ENCOUNTER: No orders of the defined types were placed in this encounter.   Current Outpatient Medications:    .  AMBULATORY NON FORMULARY MEDICATION, Medication Name: Diltiazem 2% gel with 5% Lidocaine Apply a pea sized amount internally four times daily. Dispense 30 GM zero refill, Disp: 30 g, Rfl: 0 .  aspirin-acetaminophen-caffeine (EXCEDRIN MIGRAINE) 250-250-65 MG tablet, Take by mouth every 6 (six) hours as needed for headache., Disp: , Rfl:  .  cyclobenzaprine (FLEXERIL) 10 MG tablet, Take 10 mg by mouth 2 (two) times daily., Disp: , Rfl:  .  diclofenac sodium (VOLTAREN) 1 % GEL, Apply 2 g topically 4 (four) times daily., Disp: 900 g, Rfl: 0 .  DULoxetine (CYMBALTA) 60 MG capsule, TAKE 1 CAPSULE EVERY DAY (Patient taking differently: Take 60 mg by mouth daily. ), Disp: 90 capsule, Rfl: 1 .  fluticasone (FLONASE) 50 MCG/ACT nasal spray, 1 spray by Each Nare route 2 times daily., Disp: , Rfl:  .  HYDROcodone-acetaminophen (NORCO) 7.5-325 MG tablet, Take 1 tablet by mouth 3 (three) times daily as needed for moderate pain. , Disp: , Rfl:  .  lisinopril (ZESTRIL) 5 MG tablet, Take 10 mg by mouth daily., Disp: , Rfl:  .  meclizine (ANTIVERT) 25 MG tablet, Take 1 tablet by mouth as needed., Disp: , Rfl:  .  omeprazole (PRILOSEC) 20 MG capsule, Take 20 mg by mouth 2 (two) times daily before a meal. , Disp: , Rfl:  .  rOPINIRole (REQUIP) 0.25 MG tablet, Take 0.25 mg by mouth at bedtime., Disp: , Rfl:  .  SUMAtriptan (IMITREX) 100 MG tablet, Take 1 tablet by mouth as needed., Disp: , Rfl:  .  hepatitis A-hepatitis B (TWINRIX) 720-20 ELU-MCG/ML injection, Inject 0.5 ml into muscle on day 0, 30 days, and 6 months (Patient not taking: Reported on 12/18/2019), Disp: 0.5 mL, Rfl: 2  Orders Placed This Encounter  Procedures  . Pro b natriuretic peptide (BNP)  . CMP14+EGFR  . TSH  . PCV MYOCARDIAL PERFUSION WITH LEXISCAN  . LONG TERM MONITOR (3-14 DAYS)  . EKG 12-Lead  . PCV ECHOCARDIOGRAM COMPLETE    There are no Patient Instructions on file for  this visit.   --Continue cardiac medications as reconciled in  final medication list. --Return in about 4 weeks (around 01/15/2020) for Reevaluation of  Dyspnea and palpitations. . Or sooner if needed. --Continue follow-up with your primary care physician regarding the management of your other chronic comorbid conditions.  Patient's questions and concerns were addressed to her satisfaction. She voices understanding of the instructions provided during this encounter.   This note was created using a voice recognition software as a result there may be grammatical errors inadvertently enclosed that do not reflect the nature of this encounter. Every attempt is made to correct such errors.  Rex Kras, [place], [facility]  Pager: [phone] Office: [phone]

## 2019-12-20 ENCOUNTER — Ambulatory Visit: Payer: Medicare HMO

## 2019-12-20 ENCOUNTER — Other Ambulatory Visit: Payer: Self-pay

## 2019-12-20 DIAGNOSIS — R0609 Other forms of dyspnea: Secondary | ICD-10-CM

## 2019-12-20 DIAGNOSIS — R002 Palpitations: Secondary | ICD-10-CM

## 2019-12-25 ENCOUNTER — Other Ambulatory Visit: Payer: Self-pay

## 2019-12-25 DIAGNOSIS — R0609 Other forms of dyspnea: Secondary | ICD-10-CM

## 2019-12-25 DIAGNOSIS — R9431 Abnormal electrocardiogram [ECG] [EKG]: Secondary | ICD-10-CM

## 2019-12-26 ENCOUNTER — Other Ambulatory Visit: Payer: Self-pay

## 2019-12-26 ENCOUNTER — Ambulatory Visit: Payer: Medicare HMO

## 2019-12-26 LAB — CMP14+EGFR
ALT: 100 IU/L — ABNORMAL HIGH (ref 0–32)
AST: 63 IU/L — ABNORMAL HIGH (ref 0–40)
Albumin/Globulin Ratio: 2 (ref 1.2–2.2)
Albumin: 4.5 g/dL (ref 3.8–4.9)
Alkaline Phosphatase: 88 IU/L (ref 44–121)
BUN/Creatinine Ratio: 17 (ref 9–23)
BUN: 14 mg/dL (ref 6–24)
Bilirubin Total: 0.4 mg/dL (ref 0.0–1.2)
CO2: 25 mmol/L (ref 20–29)
Calcium: 9.9 mg/dL (ref 8.7–10.2)
Chloride: 103 mmol/L (ref 96–106)
Creatinine, Ser: 0.84 mg/dL (ref 0.57–1.00)
GFR calc Af Amer: 92 mL/min/{1.73_m2} (ref >59)
GFR calc non Af Amer: 80 mL/min/{1.73_m2} (ref >59)
Globulin, Total: 2.2 g/dL (ref 1.5–4.5)
Glucose: 107 mg/dL — ABNORMAL HIGH (ref 65–99)
Potassium: 4.1 mmol/L (ref 3.5–5.2)
Sodium: 142 mmol/L (ref 134–144)
Total Protein: 6.7 g/dL (ref 6.0–8.5)

## 2019-12-26 LAB — TSH: TSH: 2.39 u[IU]/mL (ref 0.450–4.500)

## 2019-12-26 LAB — PRO B NATRIURETIC PEPTIDE: NT-Pro BNP: 16 pg/mL (ref 0–249)

## 2019-12-26 IMAGING — US US ABDOMEN LIMITED
1 series · 14 of 25 positions shown · non-contrast
Comparison: None.

CLINICAL DATA: Elevated liver function tests. History of
cholecystectomy.

EXAM:
ULTRASOUND ABDOMEN LIMITED RIGHT UPPER QUADRANT

[Series 1: us abdomen limited · 0.28mm/px · 14 of 31 slices shown]
[im 1/31]
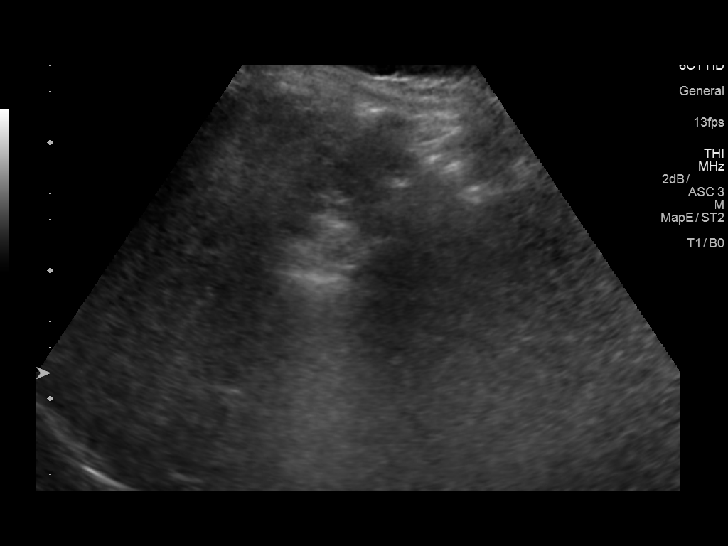
[im 3/31]
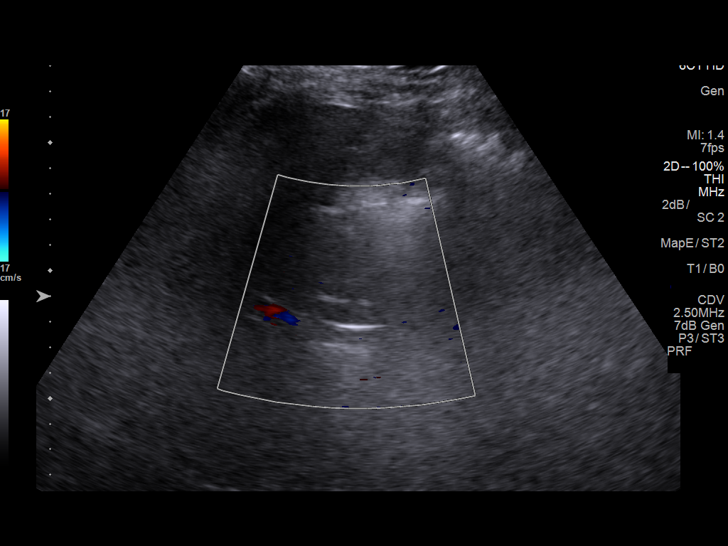
[im 6/31]
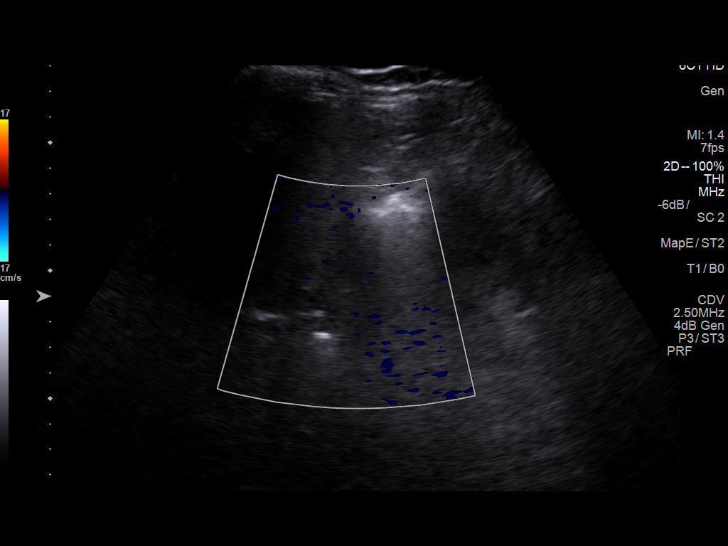
[im 8/31]
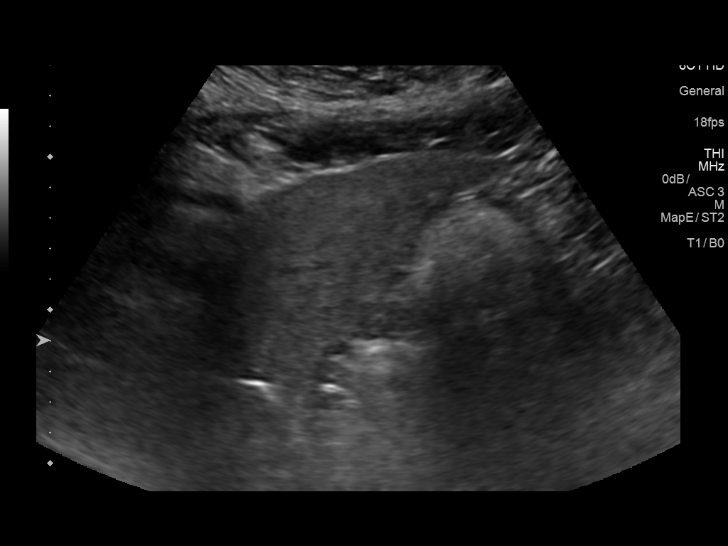
[im 11/31]
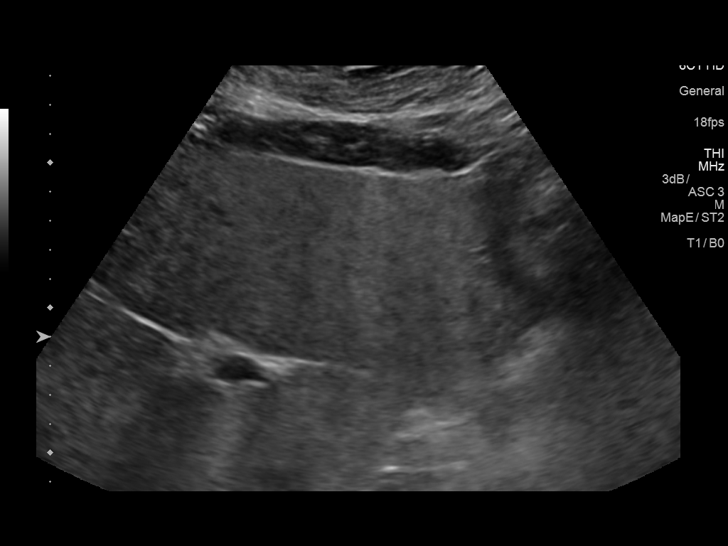
[im 12/31]
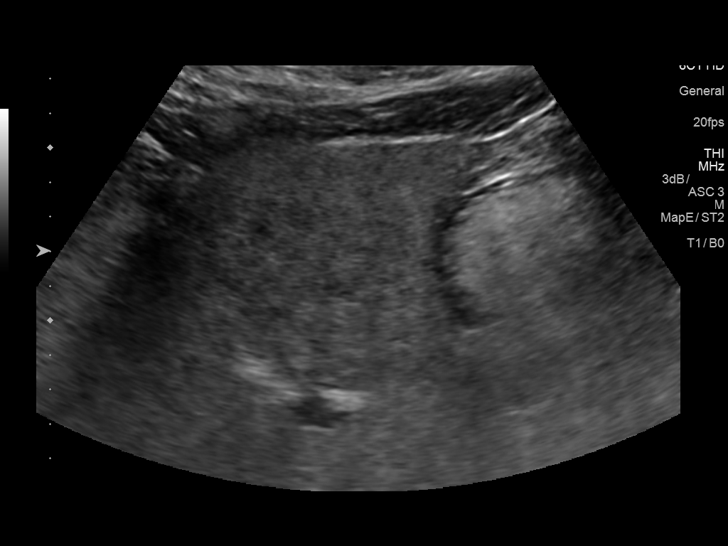
[im 14/31]
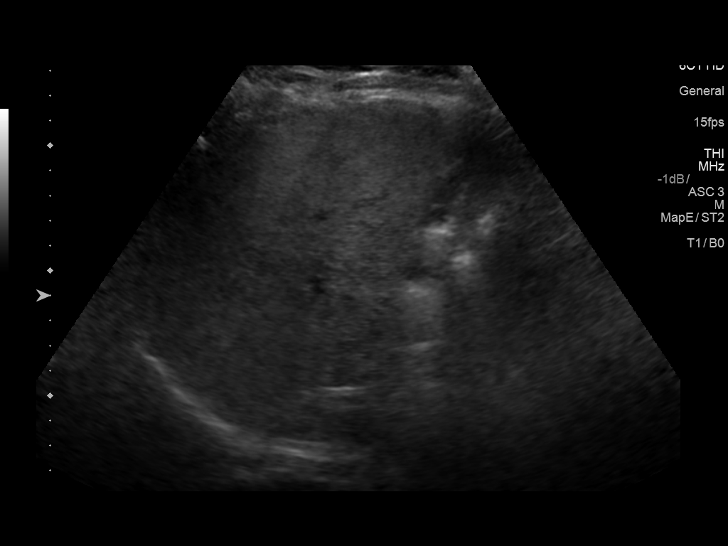
[im 17/31]
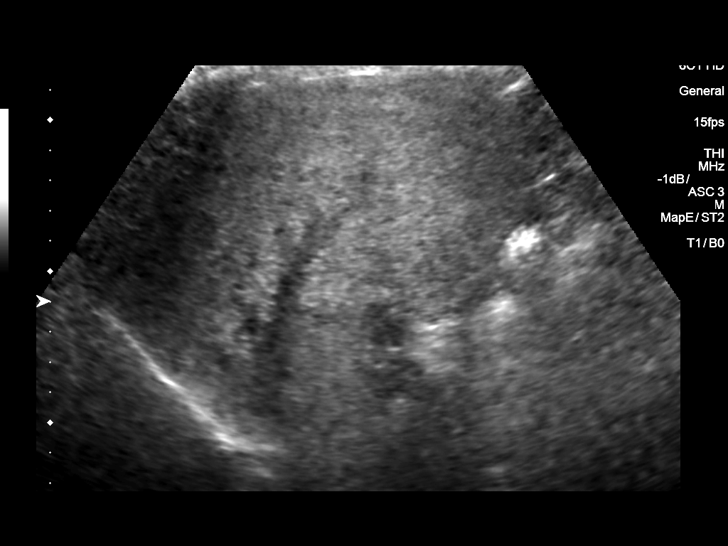
[im 19/31]
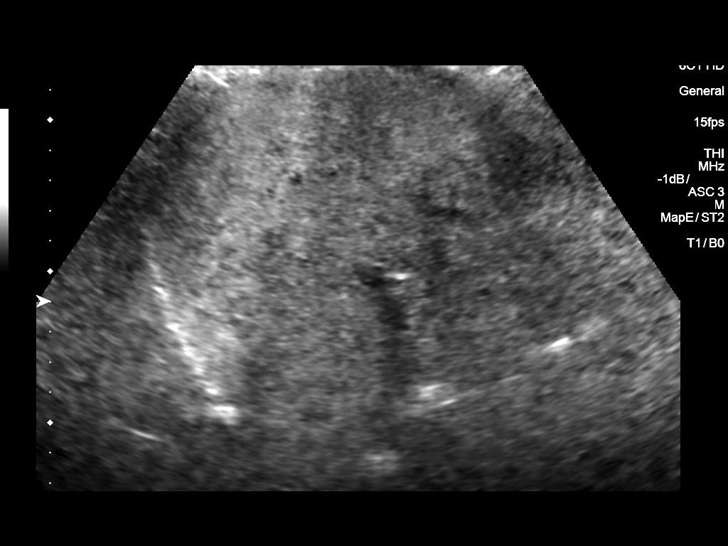
[im 21/31]
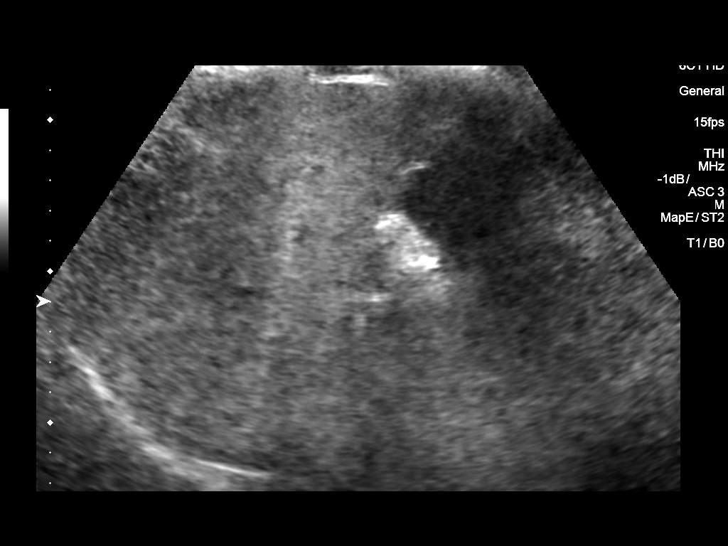
[im 23/31]
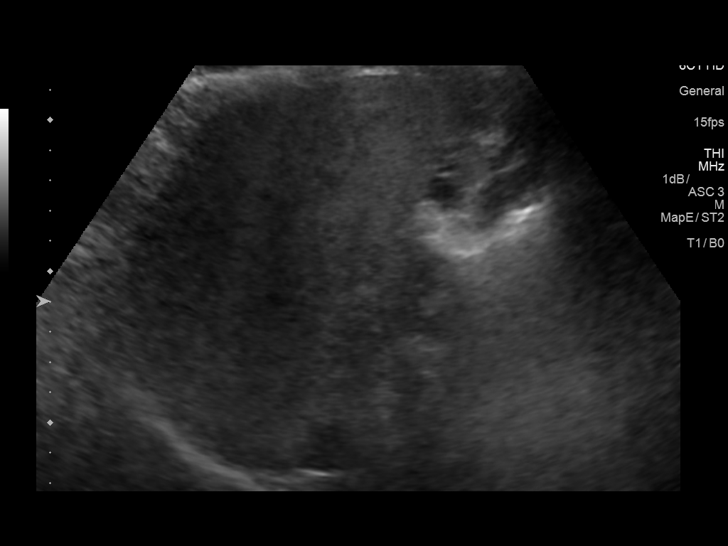
[im 26/31]
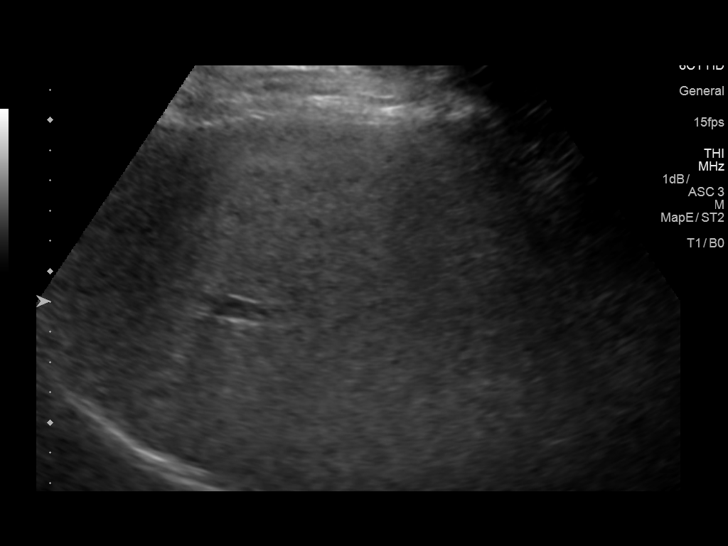
[im 28/31]
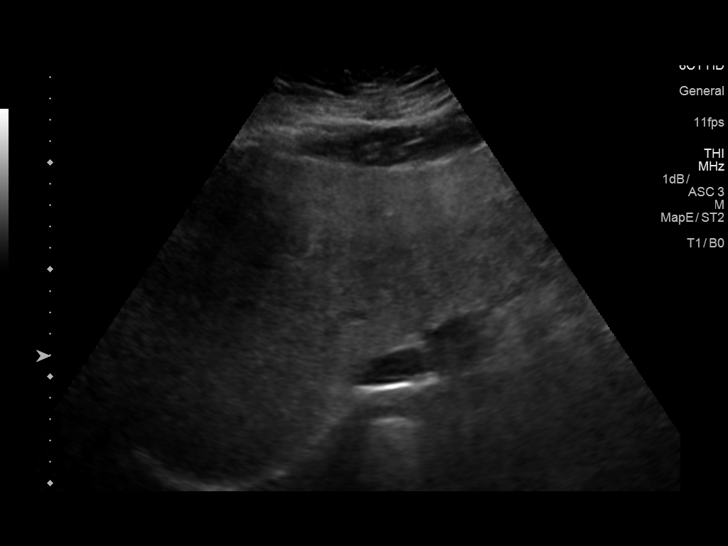
[im 31/31]
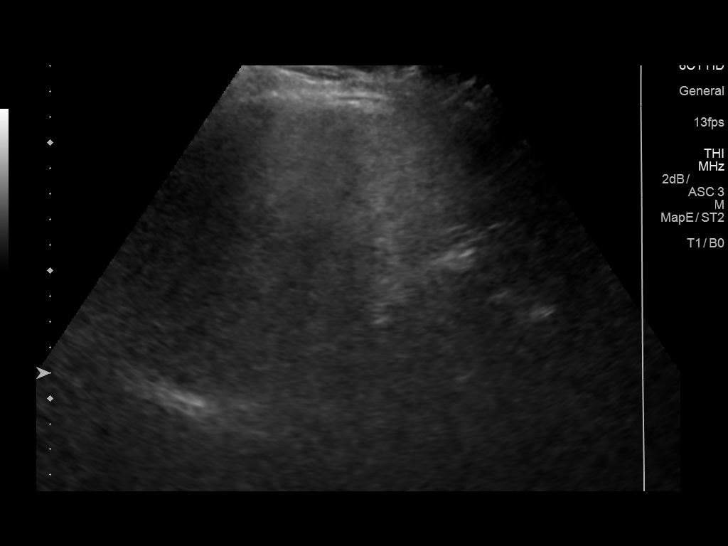

[14 of 25 positions shown; findings below may reference images not displayed]

FINDINGS: Gallbladder:

Removed.

Common bile duct:

Diameter: 0.6 cm

Liver:

Echotexture is heterogeneous and echogenicity is somewhat increased.
No focal lesion is identified. Portal vein is patent on color
Doppler imaging with normal direction of blood flow towards the
liver.
IMPRESSION: Fatty infiltration of the liver without focal lesion or biliary
ductal dilatation.

Status post cholecystectomy.

## 2019-12-31 NOTE — Progress Notes (Signed)
Called and spoke with patient regarding her lab results. Patient has already spoke with her PCP about her liver enzymes.

## 2020-01-02 NOTE — Progress Notes (Signed)
Spoke to patient transferred patient to [facility] to get pt in sooner.

## 2020-01-08 ENCOUNTER — Ambulatory Visit: Payer: Medicare HMO | Admitting: Cardiology

## 2020-01-08 ENCOUNTER — Other Ambulatory Visit: Payer: Self-pay

## 2020-01-08 ENCOUNTER — Encounter: Payer: Self-pay | Admitting: Cardiology

## 2020-01-08 VITALS — BP 117/72 | HR 94 | Resp 16 | Ht 68.0 in | Wt 284.0 lb

## 2020-01-08 DIAGNOSIS — R9431 Abnormal electrocardiogram [ECG] [EKG]: Secondary | ICD-10-CM

## 2020-01-08 DIAGNOSIS — R002 Palpitations: Secondary | ICD-10-CM

## 2020-01-08 DIAGNOSIS — I10 Essential (primary) hypertension: Secondary | ICD-10-CM

## 2020-01-08 DIAGNOSIS — R9439 Abnormal result of other cardiovascular function study: Secondary | ICD-10-CM

## 2020-01-08 DIAGNOSIS — R0609 Other forms of dyspnea: Secondary | ICD-10-CM

## 2020-01-08 NOTE — Progress Notes (Signed)
Date:  01/08/2020   ID:  Mindi Curling, DOB Jun 05, 1965, MRN [id number]  PCP:  Bartholome Bill, MD  Cardiologist:  Rex Kras, DO, [facility] (established care 12/18/2019)  Date: 01/08/20 Last Office Visit: 12/18/2019  Chief Complaint  Patient presents with  . Dyspnea on exertion  . Follow-up  . Results    HPI  Wanda Mcintosh is a 54 y.o. female who presents to the office with a chief complaint of " reevaluation of shortness of breath and review test results." Patient's past medical history and cardiovascular risk factors include: Hypertension, obesity due to excess calories.   She is referred to the office at the request of Bartholome Bill, MD for evaluation of exertional dyspnea.  As noted above she was referred to the office for evaluation of exertional dyspnea.  Patient states that her symptoms have been progressive over the last 5 to 6 years.  Usually brought on by effort related activities such as walking up a flight of stairs, going uphill, mowing her lawn.  She was attributing the discomfort to underlying obesity.  After mentioning her symptoms to her primary care provider in addition to underlying palpitations she was referred to cardiology for further evaluation and management.  At last office visit recommended echocardiogram, stress test, and a 7-day extended Holter monitor.  The echocardiogram notes preserved LVEF, normal diastolic filling pattern, no significant valvular heart disease.  However, stress test is suggestive of reversible ischemia in the possible RCA/LCx distribution.  However, overall accuracy of the study is limited due to soft tissue (BMI 43) and breast tissue attenuation artifact.    Symptomatically she continues to have effort related dyspnea.   No significant change in palpitations despite complete cessation of caffeinated beverages, stimulant medications. Her extended Holter monitor is still getting processed by [employer], results unavailable to  review at today's office visit.  Clinically I suspect that she may have underlying sleep apnea given her body habitus, feeling tired and fatigue.  She is recommended that discussed possibly repeat sleep study with PCP.  No family history of premature coronary disease or sudden cardiac death.  Denies prior history of coronary artery disease, myocardial infarction, congestive heart failure, deep venous thrombosis, pulmonary embolism, stroke, transient ischemic attack.  FUNCTIONAL STATUS: No structured exercise program or daily routine.   ALLERGIES: Allergies  Allergen Reactions  . Baclofen Other (See Comments) and Swelling    Lower limb swelling  . Celebrex [Celecoxib] Other (See Comments)    Excessive bloating GI pain Paleness Excessive sleeping Mood change (aggression)  . Diclofenac Sodium Other (See Comments)    elevated liver enzymes  . Flexeril [Cyclobenzaprine] Other (See Comments)    Other reaction(s): Headache Headache  Headache   . Gabapentin Swelling    Tears up stomach Feet swell  . Nsaids     Mobic tears up stomach.  . Ondansetron Hcl Other (See Comments)    Other reaction(s): Headache migraines  . Zofran Other (See Comments)    migraines    MEDICATION LIST PRIOR TO VISIT: Current Meds  Medication Sig  . AMBULATORY NON FORMULARY MEDICATION Medication Name: Diltiazem 2% gel with 5% Lidocaine Apply a pea sized amount internally four times daily. Dispense 30 GM zero refill  . aspirin-acetaminophen-caffeine (EXCEDRIN MIGRAINE) 250-250-65 MG tablet Take by mouth every 6 (six) hours as needed for headache.  . cyclobenzaprine (FLEXERIL) 10 MG tablet Take 10 mg by mouth 2 (two) times daily.  . diclofenac sodium (VOLTAREN) 1 % GEL Apply 2  g topically 4 (four) times daily.  . DULoxetine (CYMBALTA) 60 MG capsule TAKE 1 CAPSULE EVERY DAY (Patient taking differently: Take 60 mg by mouth daily. )  . fluticasone (FLONASE) 50 MCG/ACT nasal spray 1 spray by Each Nare route  2 times daily.  Marland Kitchen HYDROcodone-acetaminophen (NORCO) 7.5-325 MG tablet Take 1 tablet by mouth 3 (three) times daily as needed for moderate pain.   Marland Kitchen lisinopril (ZESTRIL) 5 MG tablet Take 10 mg by mouth daily.  . meclizine (ANTIVERT) 25 MG tablet Take 1 tablet by mouth as needed.  Marland Kitchen omeprazole (PRILOSEC) 20 MG capsule Take 20 mg by mouth 2 (two) times daily before a meal.   . rOPINIRole (REQUIP) 0.25 MG tablet Take 0.25 mg by mouth at bedtime.  . SUMAtriptan (IMITREX) 100 MG tablet Take 1 tablet by mouth as needed.     PAST MEDICAL HISTORY: Past Medical History:  Diagnosis Date  . Anemia   . Back injury   . Barrett's esophagus   . 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 Chronic back pain   . Complication of anesthesia    possible aggression coming out of anesthesia  . Gallstones   . GERD (gastroesophageal reflux disease)   . Head ache   . Migraine   . Osteoarthritis   . Status post dilation of esophageal narrowing   . Vertigo     PAST SURGICAL HISTORY: Past Surgical History:  Procedure Laterality Date  . BREAST EXCISIONAL BIOPSY Bilateral   . BREAST SURGERY Bilateral    rt fibroid 90. lft 94  . CARPAL TUNNEL RELEASE Left   . CARPAL TUNNEL RELEASE Right 12/2016  . CHOLECYSTECTOMY  07/16/2011   Procedure: LAPAROSCOPIC CHOLECYSTECTOMY WITH INTRAOPERATIVE CHOLANGIOGRAM;  Surgeon: Merrie Roof, MD;  Location: [facility];  Service: General;  Laterality: N/A;  . HAND SURGERY     cartilage  . LEEP    . TONSILLECTOMY      FAMILY HISTORY: The patient family history includes Breast cancer (age of onset: 88) in her mother; Colon polyps in her father, paternal uncle, and paternal uncle; Diabetes in her maternal aunt, maternal grandmother, and paternal grandmother; Hypertension in her father and mother; Irritable bowel syndrome in her son; Prostate cancer in her paternal grandfather and paternal uncle.  SOCIAL HISTORY:  The patient  reports that she has never smoked. She has never used  smokeless tobacco. She reports current alcohol use. She reports that she does not use drugs.  REVIEW OF SYSTEMS: Review of Systems  Constitutional: Negative for chills and fever.  HENT: Negative for hoarse voice and nosebleeds.   Eyes: Negative for discharge, double vision and pain.  Cardiovascular: Positive for dyspnea on exertion. Negative for chest pain, claudication, leg swelling, near-syncope, orthopnea, palpitations, paroxysmal nocturnal dyspnea and syncope.  Respiratory: Negative for hemoptysis and shortness of breath.   Musculoskeletal: Negative for muscle cramps and myalgias.  Gastrointestinal: Negative for abdominal pain, constipation, diarrhea, hematemesis, hematochezia, melena, nausea and vomiting.  Neurological: Positive for difficulty with concentration. Negative for focal weakness and headaches.    PHYSICAL EXAM: Vitals with BMI 01/08/2020 12/18/2019 08/29/2019  Height 5' 8" 5' 8" 5' 8"  Weight 284 lbs 286 lbs 278 lbs 10 oz  BMI 43.19 00.9 38.18  Systolic 299 371 696  Diastolic 72 83 76  Pulse 94 95 89    CONSTITUTIONAL: Well-developed and well-nourished. No acute distress.  SKIN: Skin is warm and dry. No rash noted. No cyanosis. No pallor. No jaundice HEAD:  Normocephalic and atraumatic.  EYES: No scleral icterus MOUTH/THROAT: Moist oral membranes.  NECK: No JVD present. No thyromegaly noted. No carotid bruits  LYMPHATIC: No visible cervical adenopathy.  CHEST Normal respiratory effort. No intercostal retractions  LUNGS: Clear to auscultation bilaterally. No stridor. No wheezes. No rales.  CARDIOVASCULAR: Regular rate and rhythm, positive S9-H7, Soft holosystolic murmur heard at the left lower sternal border,no rubs or gallops appreciated. ABDOMINAL: Obese, soft, nontender, nondistended, positive bowel sounds all 4 quadrants. No apparent ascites.  EXTREMITIES: No peripheral edema  HEMATOLOGIC: No significant bruising NEUROLOGIC: Oriented to person, place, and time.  Nonfocal. Normal muscle tone.  PSYCHIATRIC: Normal mood and affect. Normal behavior. Cooperative  CARDIAC DATABASE: EKG: 12/18/2019: Sinus  Rhythm, 90bpm, PRWP, low voltage -possible pulmonary disease, without underlying ischemia or injury pattern.   Echocardiogram: 12/20/2019:  Normal LV systolic function with visual EF 50-55%. Left ventricle cavity is normal in size. Mild left ventricular hypertrophy. Normal global wall motion. Normal diastolic filling pattern, normal LAP. Calculated EF 54%.  Right ventricle cavity is slightly dilated, visually. Normal right ventricular function.  No significant valvular abnormalities.  No prior study for comparison.   Stress Testing: Lexiscan/modified Bruce Tetrofosmin stress test 12/26/2019: Lexiscan/modified Bruce nuclear stress test performed using 1-day protocol. Stress EKG is non-diagnostic, as this is pharmacological stress test. In addition, stress EKG at 81% MPHR showed sinus tachycardia, low voltage, poor R wave progression, nonspecific ST-T changes in inferolateral leads. SPECT images medium sized area of mild intensity decrease in tracer uptake in apical to basal inferoseptal, inferior, inferolateral myocardium. Tissue attenuation is possible, although ischemia in this region cannot be excluded.  Stress LVEF 71%. Intermediate risk study.   Heart Catheterization: None  LABORATORY DATA: CBC Latest Ref Rng & Units 05/05/2018 05/04/2018 10/20/2012  WBC 4.0 - 10.5 K/uL 4.7 4.2 5.8  Hemoglobin 12.0 - 15.0 g/dL 14.4 15.0 15.2(H)  Hematocrit 36 - 46 % 43.0 46.4(H) 42.2  Platelets 150 - 400 K/uL 147(L) 162 229    CMP Latest Ref Rng & Units 12/25/2019 05/06/2018 05/05/2018  Glucose 65 - 99 mg/dL 107(H) 191(H) 324(H)  BUN 6 - 24 mg/dL _0 Creatinine 0.57 - 1.00 mg/dL 0.84 0.74 0.97  Sodium 134 - 144 mmol/L 142 143 138  Potassium 3.5 - 5.2 mmol/L 4.1 5.0 3.0(L)  Chloride 96 - 106 mmol/L 103 104 102  CO2 20 - 29 mmol/L _1 Calcium 8.7 -  10.2 mg/dL 9.9 9.2 8.4(L)  Total Protein 6.0 - 8.5 g/dL 6.7 - -  Total Bilirubin 0.0 - 1.2 mg/dL 0.4 - -  Alkaline Phos 44 - 121 IU/L 88 - -  AST 0 - 40 IU/L 63(H) - -  ALT 0 - 32 IU/L 100(H) - -    Lipid Panel  No results found for: CHOL, TRIG, HDL, CHOLHDL, VLDL, LDLCALC, LDLDIRECT, LABVLDL  No components found for: NTPROBNP Recent Labs    12/25/19 1356  PROBNP 16   Recent Labs    12/25/19 1355  TSH 2.390    BMP Recent Labs    12/25/19 1355  NA 142  K 4.1  CL 103  CO2 25  GLUCOSE 107*  BUN 14  CREATININE 0.84  CALCIUM 9.9  GFRNONAA 80  GFRAA 92    HEMOGLOBIN A1C No results found for: HGBA1C, MPG   External Labs: Collected: Jul 23, 2019 Creatinine 0.87 mg/dL. eGFR: 77 mL/min per 1.73 m AST 65, ALT 80, alkaline phosphatase 71  IMPRESSION:  ICD-10-CM   1. Abnormal nuclear stress test  R94.39 CBC    SARS-COV-2 RNA,(COVID-19) QUAL NAAT  2. Dyspnea on exertion  R06.00   3. Nonspecific abnormal electrocardiogram (ECG) (EKG)  R94.31   4. Palpitations  R00.2   5. Benign hypertension  I10      RECOMMENDATIONS: Wanda Mcintosh is a 54 y.o. female whose past medical history and cardiac risk factors include: Hypertension, obesity due to excess calories.   Dyspnea on exertion: Progressive  Underwent an echocardiogram and stress test since last office visit.  Stress test suggestive of reversible ischemia in the LCx/RCA distribution.    Discussed undergoing coronary CTA versus invasive angiography.  After discussing the benefits, and limitations of each modality patient would like to proceed with invasive angiography with possible PCI to evaluate for obstructive CAD.    Medications reconciled  BNP within normal limits.  Abnormal nuclear stress test:  Given her cardiovascular risk factors and effort related dyspnea and nuclear stress test results of possible reversible ischemia in the LCx/RCA distribution.  Shared decision was to proceed with left  heart catheterization with possible intervention.  The procedure of left heart catheterization with possible intervention was explained to the patient in detail.   The indication, alternatives, risks and benefits were reviewed.   Complications include but not limited to bleeding, infection, vascular injury, stroke, myocardial infection, arrhythmia, kidney injury, radiation-related injury in the case of prolonged fluoroscopy use, emergency cardiac surgery, and death. The patient understands the risks of serious complication is 1-2 in 0630 with diagnostic cardiac cath and 1-2% or less with angioplasty/stenting.   I have discussed in particular detail the possibility of acute renal failure after coronary angiography particularly if PCI is necessary.  I have described that it is possible patient may need short-term dialysis and the less likely may also require long-term dialysis depending on renal recovery.  I have asked for consent to proceed with coronary angiography understanding the risks of potentially needing dialysis, as well as the risks as stated above.    The patient voices understanding and provides verbal feedback and wishes to proceed with coronary angiography with possible PCI.  Check CBC.  Screen for COVID-19  Most recent BMP reviewed  Continue aspirin, ACE inhibitors.  Currently not on statin therapy she will repeat fasting profile as part of her physical coming up.  Patient advised to bring copy of her lipid next office visit.  Palpitations:  TSH within normal limits.  Extended Holter monitor results, pending.  Benign essential hypertension . Office blood pressure is at goal.  . Medication reconciled.  . Low salt diet recommended. A diet that is rich in fruits, vegetables, legumes, and low-fat dairy products and low in snacks, sweets, and meats (such as the Dietary Approaches to Stop Hypertension [DASH] diet).  . Currently managed by primary care provider.  Of note,  most recent lab work from 12/25/2019 noted elevated AST and ALT.  Patient states that she is already followed up with Gastroenterologist and is currently being worked up for fatty liver disease.  FINAL MEDICATION LIST END OF ENCOUNTER: No orders of the defined types were placed in this encounter.   Current Outpatient Medications:  .  AMBULATORY NON FORMULARY MEDICATION, Medication Name: Diltiazem 2% gel with 5% Lidocaine Apply a pea sized amount internally four times daily. Dispense 30 GM zero refill, Disp: 30 g, Rfl: 0 .  aspirin-acetaminophen-caffeine (EXCEDRIN MIGRAINE) 250-250-65 MG tablet, Take by mouth every 6 (six) hours as needed for headache.,  Disp: , Rfl:  .  cyclobenzaprine (FLEXERIL) 10 MG tablet, Take 10 mg by mouth 2 (two) times daily., Disp: , Rfl:  .  diclofenac sodium (VOLTAREN) 1 % GEL, Apply 2 g topically 4 (four) times daily., Disp: 900 g, Rfl: 0 .  DULoxetine (CYMBALTA) 60 MG capsule, TAKE 1 CAPSULE EVERY DAY (Patient taking differently: Take 60 mg by mouth daily. ), Disp: 90 capsule, Rfl: 1 .  fluticasone (FLONASE) 50 MCG/ACT nasal spray, 1 spray by Each Nare route 2 times daily., Disp: , Rfl:  .  HYDROcodone-acetaminophen (NORCO) 7.5-325 MG tablet, Take 1 tablet by mouth 3 (three) times daily as needed for moderate pain. , Disp: , Rfl:  .  lisinopril (ZESTRIL) 5 MG tablet, Take 10 mg by mouth daily., Disp: , Rfl:  .  meclizine (ANTIVERT) 25 MG tablet, Take 1 tablet by mouth as needed., Disp: , Rfl:  .  omeprazole (PRILOSEC) 20 MG capsule, Take 20 mg by mouth 2 (two) times daily before a meal. , Disp: , Rfl:  .  rOPINIRole (REQUIP) 0.25 MG tablet, Take 0.25 mg by mouth at bedtime., Disp: , Rfl:  .  SUMAtriptan (IMITREX) 100 MG tablet, Take 1 tablet by mouth as needed., Disp: , Rfl:  .  hepatitis A-hepatitis B (TWINRIX) 720-20 ELU-MCG/ML injection, Inject 0.5 ml into muscle on day 0, 30 days, and 6 months (Patient not taking: Reported on 01/08/2020), Disp: 0.5 mL, Rfl:  2  Orders Placed This Encounter  Procedures  . SARS-COV-2 RNA,(COVID-19) QUAL NAAT  . CBC   There are no Patient Instructions on file for this visit.   --Continue cardiac medications as reconciled in final medication list. --Return in about 3 weeks (around 01/29/2020) for Post heart catheterization. Or sooner if needed. --Continue follow-up with your primary care physician regarding the management of your other chronic comorbid conditions.  Patient's questions and concerns were addressed to her satisfaction. She voices understanding of the instructions provided during this encounter.   This note was created using a voice recognition software as a result there may be grammatical errors inadvertently enclosed that do not reflect the nature of this encounter. Every attempt is made to correct such errors.  Total time spent: 42 minutes reevaluating patient's symptoms, independently reviewed the results of the nuclear stress test along with images with the patient at today's visit, discussing the benefits and limitations of both coronary CTA and left heart catheterization, obtaining consent for left heart catheterization, independently reviewing the labs dated 12/25/2019, discussing disease management, coordination of care  [employer], [place], [facility]  Pager: [phone] Office: [phone]

## 2020-01-15 ENCOUNTER — Ambulatory Visit: Payer: Medicare HMO | Admitting: Cardiology

## 2020-01-19 ENCOUNTER — Other Ambulatory Visit (HOSPITAL_COMMUNITY)
Admission: RE | Admit: 2020-01-19 | Discharge: 2020-01-19 | Disposition: A | Discharge status: Home / Self Care | Payer: Medicare HMO | Source: Ambulatory Visit | Attending: Cardiology | Admitting: Cardiology

## 2020-01-19 DIAGNOSIS — Z20822 Contact with and (suspected) exposure to covid-19: Secondary | ICD-10-CM | POA: Insufficient documentation

## 2020-01-19 DIAGNOSIS — Z01812 Encounter for preprocedural laboratory examination: Secondary | ICD-10-CM | POA: Insufficient documentation

## 2020-01-20 LAB — SARS CORONAVIRUS 2 (TAT 6-24 HRS): SARS Coronavirus 2: NEGATIVE

## 2020-01-21 ENCOUNTER — Telehealth: Payer: Self-pay

## 2020-01-22 ENCOUNTER — Ambulatory Visit (HOSPITAL_COMMUNITY)
Admission: RE | Admit: 2020-01-22 | Discharge: 2020-01-22 | Disposition: A | Discharge status: Home / Self Care | Payer: Medicare HMO | Attending: Cardiology | Admitting: Cardiology

## 2020-01-22 ENCOUNTER — Other Ambulatory Visit: Payer: Self-pay

## 2020-01-22 ENCOUNTER — Encounter (HOSPITAL_COMMUNITY)
Admission: RE | Disposition: A | Discharge status: Home / Self Care | Payer: Self-pay | Source: Home / Self Care | Attending: Cardiology

## 2020-01-22 DIAGNOSIS — Z886 Allergy status to analgesic agent status: Secondary | ICD-10-CM | POA: Insufficient documentation

## 2020-01-22 DIAGNOSIS — Z888 Allergy status to other drugs, medicaments and biological substances status: Secondary | ICD-10-CM | POA: Insufficient documentation

## 2020-01-22 DIAGNOSIS — R0600 Dyspnea, unspecified: Secondary | ICD-10-CM | POA: Diagnosis present

## 2020-01-22 DIAGNOSIS — I10 Essential (primary) hypertension: Secondary | ICD-10-CM | POA: Diagnosis not present

## 2020-01-22 DIAGNOSIS — R0609 Other forms of dyspnea: Secondary | ICD-10-CM | POA: Diagnosis present

## 2020-01-22 DIAGNOSIS — R9439 Abnormal result of other cardiovascular function study: Secondary | ICD-10-CM | POA: Insufficient documentation

## 2020-01-22 DIAGNOSIS — Z6841 Body Mass Index (BMI) 40.0 and over, adult: Secondary | ICD-10-CM | POA: Diagnosis not present

## 2020-01-22 DIAGNOSIS — R002 Palpitations: Secondary | ICD-10-CM | POA: Insufficient documentation

## 2020-01-22 DIAGNOSIS — E669 Obesity, unspecified: Secondary | ICD-10-CM | POA: Diagnosis not present

## 2020-01-22 HISTORY — PX: LEFT HEART CATH AND CORONARY ANGIOGRAPHY: CATH118249

## 2020-01-22 LAB — CBC
Hematocrit: 45 % (ref 34.0–46.6)
Hemoglobin: 15.2 g/dL (ref 11.1–15.9)
MCH: 29.9 pg (ref 26.6–33.0)
MCHC: 33.8 g/dL (ref 31.5–35.7)
MCV: 89 fL (ref 79–97)
Platelets: 242 10*3/uL (ref 150–450)
RBC: 5.08 x10E6/uL (ref 3.77–5.28)
RDW: 12.3 % (ref 11.7–15.4)
WBC: 5.8 10*3/uL (ref 3.4–10.8)

## 2020-01-22 LAB — BASIC METABOLIC PANEL
Anion gap: 8 (ref 5–15)
BUN: 14 mg/dL (ref 6–20)
CO2: 28 mmol/L (ref 22–32)
Calcium: 9.7 mg/dL (ref 8.9–10.3)
Chloride: 106 mmol/L (ref 98–111)
Creatinine, Ser: 0.76 mg/dL (ref 0.44–1.00)
GFR, Estimated: >60 mL/min (ref >60)
Glucose, Bld: 138 mg/dL — ABNORMAL HIGH (ref 70–99)
Potassium: 3.8 mmol/L (ref 3.5–5.1)
Sodium: 142 mmol/L (ref 135–145)

## 2020-01-22 SURGERY — LEFT HEART CATH AND CORONARY ANGIOGRAPHY
Anesthesia: LOCAL

## 2020-01-22 MED ORDER — HYDRALAZINE HCL 20 MG/ML IJ SOLN: 10.0000 mg | INTRAMUSCULAR | Status: DC | PRN

## 2020-01-22 MED ORDER — MIDAZOLAM HCL 2 MG/2ML IJ SOLN
INTRAMUSCULAR | Status: DC | PRN
  Administered 2020-01-22: 1 mg via INTRAVENOUS

## 2020-01-22 MED ORDER — SODIUM CHLORIDE 0.9% FLUSH: 3.0000 mL | INTRAVENOUS | Status: DC | PRN

## 2020-01-22 MED ORDER — ACETAMINOPHEN 325 MG PO TABS: 650.0000 mg | ORAL_TABLET | ORAL | Status: DC | PRN

## 2020-01-22 MED ORDER — FENTANYL CITRATE (PF) 100 MCG/2ML IJ SOLN
INTRAMUSCULAR | Status: AC
  Filled 2020-01-22: qty 2

## 2020-01-22 MED ORDER — HEPARIN (PORCINE) IN NACL 1000-0.9 UT/500ML-% IV SOLN
INTRAVENOUS | Status: DC | PRN
  Administered 2020-01-22 (×2): 500 mL

## 2020-01-22 MED ORDER — HEPARIN (PORCINE) IN NACL 1000-0.9 UT/500ML-% IV SOLN
INTRAVENOUS | Status: AC
  Filled 2020-01-22: qty 1000

## 2020-01-22 MED ORDER — VERAPAMIL HCL 2.5 MG/ML IV SOLN
INTRAVENOUS | Status: DC | PRN
  Administered 2020-01-22: 10 mL via INTRA_ARTERIAL

## 2020-01-22 MED ORDER — MIDAZOLAM HCL 2 MG/2ML IJ SOLN
INTRAMUSCULAR | Status: AC
  Filled 2020-01-22: qty 2

## 2020-01-22 MED ORDER — FENTANYL CITRATE (PF) 100 MCG/2ML IJ SOLN
INTRAMUSCULAR | Status: DC | PRN
  Administered 2020-01-22: 50 ug via INTRAVENOUS

## 2020-01-22 MED ORDER — HEPARIN SODIUM (PORCINE) 1000 UNIT/ML IJ SOLN
INTRAMUSCULAR | Status: AC
  Filled 2020-01-22: qty 1

## 2020-01-22 MED ORDER — LABETALOL HCL 5 MG/ML IV SOLN: 10.0000 mg | INTRAVENOUS | Status: DC | PRN

## 2020-01-22 MED ORDER — SODIUM CHLORIDE 0.9 % WEIGHT BASED INFUSION: 3.0000 mL/kg/h | INTRAVENOUS | Status: AC

## 2020-01-22 MED ORDER — HEPARIN SODIUM (PORCINE) 1000 UNIT/ML IJ SOLN
INTRAMUSCULAR | Status: DC | PRN
  Administered 2020-01-22: 6000 [IU] via INTRAVENOUS

## 2020-01-22 MED ORDER — ASPIRIN 81 MG PO CHEW
81.0000 mg | CHEWABLE_TABLET | ORAL | Status: AC
  Administered 2020-01-22: 81 mg via ORAL
  Filled 2020-01-22: qty 1

## 2020-01-22 MED ORDER — SODIUM CHLORIDE 0.9 % IV SOLN: INTRAVENOUS | Status: AC

## 2020-01-22 MED ORDER — LIDOCAINE HCL (PF) 1 % IJ SOLN
INTRAMUSCULAR | Status: DC | PRN
  Administered 2020-01-22: 2 mL

## 2020-01-22 MED ORDER — SODIUM CHLORIDE 0.9% FLUSH: 3.0000 mL | Freq: Two times a day (BID) | INTRAVENOUS | Status: DC

## 2020-01-22 MED ORDER — VERAPAMIL HCL 2.5 MG/ML IV SOLN
INTRAVENOUS | Status: AC
  Filled 2020-01-22: qty 2

## 2020-01-22 MED ORDER — ONDANSETRON HCL 4 MG/2ML IJ SOLN: 4.0000 mg | Freq: Four times a day (QID) | INTRAMUSCULAR | Status: DC | PRN

## 2020-01-22 MED ORDER — SODIUM CHLORIDE 0.9 % IV SOLN: 250.0000 mL | INTRAVENOUS | Status: DC | PRN

## 2020-01-22 MED ORDER — LIDOCAINE HCL (PF) 1 % IJ SOLN
INTRAMUSCULAR | Status: AC
  Filled 2020-01-22: qty 30

## 2020-01-22 MED ORDER — SODIUM CHLORIDE 0.9 % WEIGHT BASED INFUSION
1.0000 mL/kg/h | INTRAVENOUS | Status: DC
  Administered 2020-01-22: 1 mL/kg/h via INTRAVENOUS

## 2020-01-22 MED ORDER — IOHEXOL 350 MG/ML SOLN
INTRAVENOUS | Status: DC | PRN
  Administered 2020-01-22: 70 mL

## 2020-01-22 SURGICAL SUPPLY — 9 items
CATH OPTITORQUE TIG 4.0 5F (CATHETERS) ×2 IMPLANT
DEVICE RAD COMP TR BAND LRG (VASCULAR PRODUCTS) ×2 IMPLANT
GLIDESHEATH SLEND A-KIT 6F 22G (SHEATH) ×2 IMPLANT
GUIDEWIRE INQWIRE 1.5J.035X260 (WIRE) ×1 IMPLANT
INQWIRE 1.5J .035X260CM (WIRE) ×2
KIT HEART LEFT (KITS) ×2 IMPLANT
PACK CARDIAC CATHETERIZATION (CUSTOM PROCEDURE TRAY) ×2 IMPLANT
TRANSDUCER W/STOPCOCK (MISCELLANEOUS) ×2 IMPLANT
TUBING CIL FLEX 10 FLL-RA (TUBING) ×2 IMPLANT

## 2020-01-22 NOTE — Discharge Instructions (Signed)
DRINK PLENTY OF FLUIDS FOR THE NEXT 2-3 DAYS.  KEEP ARM ELEVATED THE REMAINDER OF THE DAY.  Radial Site Care  This sheet gives you information about how to care for yourself after your procedure. Your health care provider may also give you more specific instructions. If you have problems or questions, contact your health care provider. What can I expect after the procedure? After the procedure, it is common to have:  Bruising and tenderness at the catheter insertion area. Follow these instructions at home: Medicines  Take over-the-counter and prescription medicines only as told by your health care provider. Insertion site care 1. Follow instructions from your health care provider about how to take care of your insertion site. Make sure you: ? Wash your hands with soap and water before you change your bandage (dressing). If soap and water are not available, use hand sanitizer. ? Change your dressing as told by your health care provider. 2. Check your insertion site every day for signs of infection. Check for: ? Redness, swelling, or pain. ? Fluid or blood. ? Pus or a bad smell. ? Warmth. 3. Do not take baths, swim, or use a hot tub for 5 days. 4. You may shower 24-48 hours after the procedure. ? Remove the dressing and gently wash the site with plain soap and water. ? Pat the area dry with a clean towel. ? Do not rub the site. That could cause bleeding. 5. Do not apply powder or lotion to the site. Activity  1. For 24 hours after the procedure, or as directed by your health care provider: ? Do not flex or bend the affected arm. ? Do not push or pull heavy objects with the affected arm. ? Do not drive yourself home from the hospital or clinic. You may drive 24 hours after the procedure. ? Do not operate machinery or power tools. 2. Do not push, pull or lift anything that is heavier than 10 lb for 5 days. 3. Ask your health care provider when it is okay to: ? Return to work or  school. ? Resume usual physical activities or sports. ? Resume sexual activity. General instructions  If the catheter site starts to bleed, raise your arm and put firm pressure on the site. If the bleeding does not stop, get help right away. This is a medical emergency.  If you went home on the same day as your procedure, a responsible adult should be with you for the first 24 hours after you arrive home.  Keep all follow-up visits as told by your health care provider. This is important. Contact a health care provider if:  You have a fever.  You have redness, swelling, or yellow drainage around your insertion site. Get help right away if:  You have unusual pain at the radial site.  The catheter insertion area swells very fast.  The insertion area is bleeding, and the bleeding does not stop when you hold steady pressure on the area.  Your arm or hand becomes pale, cool, tingly, or numb. These symptoms may represent a serious problem that is an emergency. Do not wait to see if the symptoms will go away. Get medical help right away. Call your local emergency services (911 in the [place] Do not drive yourself to the hospital. Summary  After the procedure, it is common to have bruising and tenderness at the site.  Follow instructions from your health care provider about how to take care of your radial site wound. Check   the wound every day for signs of infection.  Do not push, pull or lift anything that is heavier than 10 lb for 5 days.  This information is not intended to replace advice given to you by your health care provider. Make sure you discuss any questions you have with your health care provider. Document Revised: 04/13/2017 Document Reviewed: 04/13/2017 Elsevier Patient Education  2020 [employer] 

## 2020-01-22 NOTE — H&P (Signed)
OV 01/08/2020 copied for documentation    Date:  01/22/2020   ID:  Wanda Mcintosh September 05, 1965, MRN [id number]  PCP:  Bartholome Bill, MD  Cardiologist:  Nigel Mormon, DO, [place] (established care 12/18/2019)  Date: 01/08/20 Last Office Visit: 12/18/2019  No chief complaint on file.   HPI  Wanda Mcintosh is a 54 y.o. female who presents to the office with a chief complaint of " reevaluation of shortness of breath and review test results." Patient's past medical history and cardiovascular risk factors include: Hypertension, obesity due to excess calories.   She is referred to the office at the request of No ref. provider found for evaluation of exertional dyspnea.  As noted above she was referred to the office for evaluation of exertional dyspnea.  Patient states that her symptoms have been progressive over the last 5 to 6 years.  Usually brought on by effort related activities such as walking up a flight of stairs, going uphill, mowing her lawn.  She was attributing the discomfort to underlying obesity.  After mentioning her symptoms to her primary care provider in addition to underlying palpitations she was referred to cardiology for further evaluation and management.  At last office visit recommended echocardiogram, stress test, and a 7-day extended Holter monitor.  The echocardiogram notes preserved LVEF, normal diastolic filling pattern, no significant valvular heart disease.  However, stress test is suggestive of reversible ischemia in the possible RCA/LCx distribution.  However, overall accuracy of the study is limited due to soft tissue (BMI 43) and breast tissue attenuation artifact.    Symptomatically she continues to have effort related dyspnea.   No significant change in palpitations despite complete cessation of caffeinated beverages, stimulant medications. Her extended Holter monitor is still getting processed by [facility], results unavailable to review at today's  office visit.  Clinically I suspect that she may have underlying sleep apnea given her body habitus, feeling tired and fatigue.  She is recommended that discussed possibly repeat sleep study with PCP.  No family history of premature coronary disease or sudden cardiac death.  Denies prior history of coronary artery disease, myocardial infarction, congestive heart failure, deep venous thrombosis, pulmonary embolism, stroke, transient ischemic attack.  FUNCTIONAL STATUS: No structured exercise program or daily routine.   ALLERGIES: Allergies  Allergen Reactions  . Baclofen Other (See Comments) and Swelling    Lower limb swelling  . Celebrex [Celecoxib] Other (See Comments)    Excessive bloating GI pain Paleness Excessive sleeping Mood change (aggression)  . Diclofenac Sodium Other (See Comments)    elevated liver enzymes  . Gabapentin Swelling    upset stomach Feet swell  . Nsaids Other (See Comments)    Mobic--upset stomach.  . Zofran Other (See Comments)    migraines    MEDICATION LIST PRIOR TO VISIT: Current Meds  Medication Sig  . aspirin-acetaminophen-caffeine (EXCEDRIN MIGRAINE) [phone] MG tablet Take by mouth every 6 (six) hours as needed for headache.  . B Complex-C (B-COMPLEX WITH VITAMIN C) tablet Take 1 tablet by mouth daily.  . cyclobenzaprine (FLEXERIL) 10 MG tablet Take 10 mg by mouth at bedtime as needed (spasms.).   Marland Kitchen DULoxetine (CYMBALTA) 60 MG capsule TAKE 1 CAPSULE EVERY DAY (Patient taking differently: Take 60 mg by mouth every evening. )  . FIBER SELECT GUMMIES PO Take 2 tablets by mouth daily.  . fluticasone (FLONASE) 50 MCG/ACT nasal spray Place 1 spray into both nostrils 2 (two) times daily as needed for allergies.   Marland Kitchen  HYDROcodone-acetaminophen (NORCO) 10-325 MG tablet Take 1 tablet by mouth 3 (three) times daily as needed (pain.).  Marland Kitchen lisinopril (ZESTRIL) 5 MG tablet Take 10 mg by mouth daily.  . meclizine (ANTIVERT) 25 MG tablet Take 25 mg by mouth  2 (two) times daily as needed (vertigo/dizziness).   . Multiple Vitamins-Minerals (ADULT GUMMY PO) Take 2 tablets by mouth daily.  Marland Kitchen omeprazole (PRILOSEC) 20 MG capsule Take 40 mg by mouth daily before breakfast.   . rOPINIRole (REQUIP) 0.25 MG tablet Take 0.25 mg by mouth at bedtime.  . SUMAtriptan (IMITREX) 100 MG tablet Take 100 mg by mouth every 2 (two) hours as needed for migraine (max 2 doses/24 hrs.).   Marland Kitchen vitamin C (ASCORBIC ACID) 250 MG tablet Take 500 mg by mouth daily.     PAST MEDICAL HISTORY: Past Medical History:  Diagnosis Date  . Anemia   . Back injury   . Barrett's esophagus   . 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 Chronic back pain   . Complication of anesthesia    possible aggression coming out of anesthesia  . Gallstones   . GERD (gastroesophageal reflux disease)   . Head ache   . Migraine   . Osteoarthritis   . Status post dilation of esophageal narrowing   . Vertigo     PAST SURGICAL HISTORY: Past Surgical History:  Procedure Laterality Date  . BREAST EXCISIONAL BIOPSY Bilateral   . BREAST SURGERY Bilateral    rt fibroid 90. lft 94  . CARPAL TUNNEL RELEASE Left   . CARPAL TUNNEL RELEASE Right 12/2016  . CHOLECYSTECTOMY  07/16/2011   Procedure: LAPAROSCOPIC CHOLECYSTECTOMY WITH INTRAOPERATIVE CHOLANGIOGRAM;  Surgeon: Merrie Roof, MD;  Location: [facility];  Service: General;  Laterality: N/A;  . HAND SURGERY     cartilage  . LEEP    . TONSILLECTOMY      FAMILY HISTORY: The patient family history includes Breast cancer (age of onset: 11) in her mother; Colon polyps in her father, paternal uncle, and paternal uncle; Diabetes in her maternal aunt, maternal grandmother, and paternal grandmother; Hypertension in her father and mother; Irritable bowel syndrome in her son; Prostate cancer in her paternal grandfather and paternal uncle.  SOCIAL HISTORY:  The patient  reports that she has never smoked. She has never used smokeless tobacco. She reports current  alcohol use. She reports that she does not use drugs.  REVIEW OF SYSTEMS: Review of Systems  Constitutional: Negative for chills and fever.  HENT: Negative for hoarse voice and nosebleeds.   Eyes: Negative for discharge, double vision and pain.  Cardiovascular: Positive for dyspnea on exertion. Negative for chest pain, claudication, leg swelling, near-syncope, orthopnea, palpitations, paroxysmal nocturnal dyspnea and syncope.  Respiratory: Negative for hemoptysis and shortness of breath.   Musculoskeletal: Negative for muscle cramps and myalgias.  Gastrointestinal: Negative for abdominal pain, constipation, diarrhea, hematemesis, hematochezia, melena, nausea and vomiting.  Neurological: Positive for difficulty with concentration. Negative for focal weakness and headaches.    PHYSICAL EXAM: Vitals with BMI 01/22/2020 01/08/2020 12/18/2019  Height $Remov'5\' 8"'GFoMWx$  $Remove'5\' 8"'VvuDoTQ$  $RemoveB'5\' 8"'oRjsVBzG$   Weight 274 lbs 284 lbs 286 lbs  BMI 41.67 32.35 57.3  Systolic 220 254 270  Diastolic 83 72 83  Pulse 84 94 95    CONSTITUTIONAL: Well-developed and well-nourished. No acute distress.  SKIN: Skin is warm and dry. No rash noted. No cyanosis. No pallor. No jaundice HEAD: Normocephalic and atraumatic.  EYES: No scleral icterus MOUTH/THROAT: Moist oral  membranes.  NECK: No JVD present. No thyromegaly noted. No carotid bruits  LYMPHATIC: No visible cervical adenopathy.  CHEST Normal respiratory effort. No intercostal retractions  LUNGS: Clear to auscultation bilaterally. No stridor. No wheezes. No rales.  CARDIOVASCULAR: Regular rate and rhythm, positive M5-H8, Soft holosystolic murmur heard at the left lower sternal border,no rubs or gallops appreciated. ABDOMINAL: Obese, soft, nontender, nondistended, positive bowel sounds all 4 quadrants. No apparent ascites.  EXTREMITIES: No peripheral edema  HEMATOLOGIC: No significant bruising NEUROLOGIC: Oriented to person, place, and time. Nonfocal. Normal muscle tone.  PSYCHIATRIC:  Normal mood and affect. Normal behavior. Cooperative  CARDIAC DATABASE: EKG: 12/18/2019: Sinus  Rhythm, 90bpm, PRWP, low voltage -possible pulmonary disease, without underlying ischemia or injury pattern.   Echocardiogram: 12/20/2019:  Normal LV systolic function with visual EF 50-55%. Left ventricle cavity is normal in size. Mild left ventricular hypertrophy. Normal global wall motion. Normal diastolic filling pattern, normal LAP. Calculated EF 54%.  Right ventricle cavity is slightly dilated, visually. Normal right ventricular function.  No significant valvular abnormalities.  No prior study for comparison.   Stress Testing: Lexiscan/modified Bruce Tetrofosmin stress test 12/26/2019: Lexiscan/modified Bruce nuclear stress test performed using 1-day protocol. Stress EKG is non-diagnostic, as this is pharmacological stress test. In addition, stress EKG at 81% MPHR showed sinus tachycardia, low voltage, poor R wave progression, nonspecific ST-T changes in inferolateral leads. SPECT images medium sized area of mild intensity decrease in tracer uptake in apical to basal inferoseptal, inferior, inferolateral myocardium. Tissue attenuation is possible, although ischemia in this region cannot be excluded.  Stress LVEF 71%. Intermediate risk study.   Heart Catheterization: None  LABORATORY DATA: CBC Latest Ref Rng & Units 01/21/2020 05/05/2018 05/04/2018  WBC 3.4 - 10.8 x10E3/uL 5.8 4.7 4.2  Hemoglobin 11.1 - 15.9 g/dL 15.2 14.4 15.0  Hematocrit 34.0 - 46.6 % 45.0 43.0 46.4(H)  Platelets 150 - 450 x10E3/uL 242 147(L) 162    CMP Latest Ref Rng & Units 01/22/2020 12/25/2019 05/06/2018  Glucose 70 - 99 mg/dL 138(H) 107(H) 191(H)  BUN 6 - 20 mg/dL _0 Creatinine 0.44 - 1.00 mg/dL 0.76 0.84 0.74  Sodium 135 - 145 mmol/L 142 142 143  Potassium 3.5 - 5.1 mmol/L 3.8 4.1 5.0  Chloride 98 - 111 mmol/L 106 103 104  CO2 22 - 32 mmol/L _1 Calcium 8.9 - 10.3 mg/dL 9.7 9.9 9.2  Total Protein  6.0 - 8.5 g/dL - 6.7 -  Total Bilirubin 0.0 - 1.2 mg/dL - 0.4 -  Alkaline Phos 44 - 121 IU/L - 88 -  AST 0 - 40 IU/L - 63(H) -  ALT 0 - 32 IU/L - 100(H) -    Lipid Panel  No results found for: CHOL, TRIG, HDL, CHOLHDL, VLDL, LDLCALC, LDLDIRECT, LABVLDL  No components found for: NTPROBNP Recent Labs    12/25/19 1356  PROBNP 16   Recent Labs    12/25/19 1355  TSH 2.390    BMP Recent Labs    12/25/19 1355 01/22/20 1220  NA 142 142  K 4.1 3.8  CL 103 106  CO2 25 28  GLUCOSE 107* 138*  BUN 14 14  CREATININE 0.84 0.76  CALCIUM 9.9 9.7  GFRNONAA 80 >60  GFRAA 92  --     HEMOGLOBIN A1C No results found for: HGBA1C, MPG   External Labs: Collected: Jul 23, 2019 Creatinine 0.87 mg/dL. eGFR: 77 mL/min per 1.73 m AST 65, ALT 80, alkaline phosphatase 71  IMPRESSION:  No diagnosis found.   RECOMMENDATIONS: Wanda Mcintosh is a 54 y.o. female whose past medical history and cardiac risk factors include: Hypertension, obesity due to excess calories.   Dyspnea on exertion: Progressive  Underwent an echocardiogram and stress test since last office visit.  Stress test suggestive of reversible ischemia in the LCx/RCA distribution.    Discussed undergoing coronary CTA versus invasive angiography.  After discussing the benefits, and limitations of each modality patient would like to proceed with invasive angiography with possible PCI to evaluate for obstructive CAD.    Medications reconciled  BNP within normal limits.  Abnormal nuclear stress test:  Given her cardiovascular risk factors and effort related dyspnea and nuclear stress test results of possible reversible ischemia in the LCx/RCA distribution.  Shared decision was to proceed with left heart catheterization with possible intervention.  The procedure of left heart catheterization with possible intervention was explained to the patient in detail.   The indication, alternatives, risks and benefits were  reviewed.   Complications include but not limited to bleeding, infection, vascular injury, stroke, myocardial infection, arrhythmia, kidney injury, radiation-related injury in the case of prolonged fluoroscopy use, emergency cardiac surgery, and death. The patient understands the risks of serious complication is 1-2 in 5397 with diagnostic cardiac cath and 1-2% or less with angioplasty/stenting.   I have discussed in particular detail the possibility of acute renal failure after coronary angiography particularly if PCI is necessary.  I have described that it is possible patient may need short-term dialysis and the less likely may also require long-term dialysis depending on renal recovery.  I have asked for consent to proceed with coronary angiography understanding the risks of potentially needing dialysis, as well as the risks as stated above.    The patient voices understanding and provides verbal feedback and wishes to proceed with coronary angiography with possible PCI.  Check CBC.  Screen for COVID-19  Most recent BMP reviewed  Continue aspirin, ACE inhibitors.  Currently not on statin therapy she will repeat fasting profile as part of her physical coming up.  Patient advised to bring copy of her lipid next office visit.  Palpitations:  TSH within normal limits.  Extended Holter monitor results, pending.  Benign essential hypertension . Office blood pressure is at goal.  . Medication reconciled.  . Low salt diet recommended. A diet that is rich in fruits, vegetables, legumes, and low-fat dairy products and low in snacks, sweets, and meats (such as the Dietary Approaches to Stop Hypertension [DASH] diet).  . Currently managed by primary care provider.  Of note, most recent lab work from 12/25/2019 noted elevated AST and ALT.  Patient states that she is already followed up with Gastroenterologist and is currently being worked up for fatty liver disease.  FINAL MEDICATION LIST END OF  ENCOUNTER: Meds ordered this encounter  Medications  . FOLLOWED BY Linked Order Group   . 0.9% sodium chloride infusion   . 0.9% sodium chloride infusion  . sodium chloride flush (NS) 0.9 % injection 3 mL  . sodium chloride flush (NS) 0.9 % injection 3 mL  . 0.9 %  sodium chloride infusion  . aspirin chewable tablet 81 mg    Current Facility-Administered Medications:  .  0.9 %  sodium chloride infusion, 250 mL, Intravenous, PRN, Adrian Prows, MD .  [EXPIRED] 0.9% sodium chloride infusion, 3 mL/kg/hr, Intravenous, Continuous **FOLLOWED BY** 0.9% sodium chloride infusion, 1 mL/kg/hr, Intravenous, Continuous, Adrian Prows, MD, Last Rate: 128.8 mL/hr at 01/22/20 1319,  1 mL/kg/hr at 01/22/20 1319 .  sodium chloride flush (NS) 0.9 % injection 3 mL, 3 mL, Intravenous, Q12H, Adrian Prows, MD .  sodium chloride flush (NS) 0.9 % injection 3 mL, 3 mL, Intravenous, PRN, Adrian Prows, MD  Orders Placed This Encounter  Procedures  . Basic metabolic panel  . Diet NPO time specified  . Informed Consent Details: Physician/Practitioner Attestation; Transcribe to consent form and obtain patient signature  . Confirm CBC and BMP (or CMP) results within 7 days for inpatient and 30 days for outpatient: Outpatients with severe anemia (hgb<10, CKD, severe thrombocytopenia plts<100) labs should be within 10 days. Only draw PT/INR on patients that are on Coumadin, Hgb<10, have liver disease (cirrhosis, liver CA, hepatitis, etc). Urine pregnancy test within hospital admission for inpatients of child bearing age, for outpatients day of procedure.  . Confirm EKG performed within 30 days for cardiac procedures and 12 months for peripheral vascular procedures.  Place order for EKG if missing or not within timeframe.  . Verify aspirin and / or anti-platelet medication (Plavix, Effient, Brilinta) dose available for cardiac / peripheral vascular procedure day. IF ordered daily / once, adjust schedule to administer before procedure.   . Initiate Cath/PCI clinical path; encourage patient to watch CCTV video  . Clip R groin  . Clip R radial (no IV/bracelet R wrist)  . EKG 12-Lead  . Insert peripheral IV  . Insert 2nd peripheral IV site-Saline lock IV   There are no outpatient Patient Instructions on file for this admission.   --Continue cardiac medications as reconciled in final medication list. --No follow-ups on file. Or sooner if needed. --Continue follow-up with your primary care physician regarding the management of your other chronic comorbid conditions.  Patient's questions and concerns were addressed to her satisfaction. She voices understanding of the instructions provided during this encounter.   This note was created using a voice recognition software as a result there may be grammatical errors inadvertently enclosed that do not reflect the nature of this encounter. Every attempt is made to correct such errors.  Total time spent: 42 minutes reevaluating patient's symptoms, independently reviewed the results of the nuclear stress test along with images with the patient at today's visit, discussing the benefits and limitations of both coronary CTA and left heart catheterization, obtaining consent for left heart catheterization, independently reviewing the labs dated 12/25/2019, discussing disease management, coordination of care  [place], [place], [facility]  Pager: [phone] Office: [phone]

## 2020-01-22 NOTE — Progress Notes (Signed)
Discharge instructions reviewed with pt and her son (via telephone) both voice understanding.  

## 2020-01-22 NOTE — Interval H&P Note (Signed)
History and Physical Interval Note:  01/22/2020 1:56 PM  [place]  has presented today for surgery, with the diagnosis of dyspnea on exertion.  The various methods of treatment have been discussed with the patient and family. After consideration of risks, benefits and other options for treatment, the patient has consented to  Procedure(s): LEFT HEART CATH AND CORONARY ANGIOGRAPHY (N/A) as a surgical intervention.  The patient's history has been reviewed, patient examined, no change in status, stable for surgery.  I have reviewed the patient's chart and labs.  Questions were answered to the patient's satisfaction.    2016/2017 Appropriate Use Criteria for Coronary Revascularization Clinical Presentation: Diabetes Mellitus? Symptom Status? S/P CABG? Antianginal Therapy (# of long-acting drugs)? Results of Non-invasive testing? FFR/iFR results in all diseased vessels? Patient undergoing renal transplant? Patient undergoing percutaneous valve procedure (TAVR, MitraClip, Others)? Symptom Status:  Ischemic Symptoms  Non-invasive Testing:  Intermediate Risk  If no or indeterminate stress test, FFR/iFR results in all diseased vessels:  N/A  Diabetes Mellitus:  No  S/P CABG:  No  Antianginal therapy (number of long-acting drugs):  0  Patient undergoing renal transplant:  No  Patient undergoing percutaneous valve procedure:  No    newline 1 Vessel Disease PCI CABG  No proximal LAD involvement, No proximal left dominant LCX involvement M (5); Indication 2 M (4); Indication 2   Proximal left dominant LCX involvement M (6); Indication 5 M (6); Indication 5   Proximal LAD involvement M (6); Indication 5 M (6); Indication 5   newline 2 Vessel Disease  No proximal LAD involvement M (6); Indication 8 M (5); Indication 8   Proximal LAD involvement A (7); Indication 11 A (7); Indication 11   newline 3 Vessel Disease  Low disease complexity (e.g., focal stenoses, SYNTAX <=22) A (7); Indication  17 A (7); Indication 17   Intermediate or high disease complexity (e.g., SYNTAX >=23) M (6); Indication 21 A (7); Indication 21   newline Left Main Disease  Isolated LMCA disease: ostial or midshaft A (7); Indication 24 A (8); Indication 24   Isolated LMCA disease: bifurcation involvement M (5); Indication 25 A (8); Indication 25   LMCA ostial or midshaft, concurrent low disease burden multivessel disease (e.g., 1-2 additional focal stenoses, SYNTAX <=22) M (6); Indication 26 A (9); Indication 26   LMCA ostial or midshaft, concurrent intermediate or high disease burden multivessel disease (e.g., 1-2 additional bifurcation stenoses, long stenoses, SYNTAX >=23) M (4); Indication 27 A (9); Indication 27   LMCA bifurcation involvement, concurrent low disease burden multivessel disease (e.g., 1-2 additional focal stenoses, SYNTAX <=22) M (5); Indication 28 A (8); Indication 28   LMCA bifurcation involvement, concurrent intermediate or high disease burden multivessel disease (e.g., 1-2 additional bifurcation stenoses, long stenoses, SYNTAX >=23) R (3); Indication 29 A (9); Indication 29     Notes: A indicates appropriate. M indicates may be appropriate. R indicates rarely appropriate. Number in parentheses is median score for that indication. Reclassify indicates number of functionally diseased vessels should be decreased given negative FFR/iFR. Re-evaluate the scenario interpreting any FFR/iFR negative vessel as being not significantly stenosed. Disease means involved vessel provides flow to a sufficient amount of myocardium to be clinically important. If FFR testing indicates a vessel is not significant, that vessel should not be considered diseased (and the patient should be reclassified with respect to extent of functionally significant disease). Proximal LAD + proximal left dominant LCX is considered 3 vessel CAD 2 Vessel CAD  with FFR/iFR abnormal in only 1 but not both is considered 1 vessel  CAD Disease complexity includes occlusion, bifurcation, trifurcation, ostial, >20 mm, tortuosity, calcification, thrombus LMCA disease is >=50% by angiography, MLD <2.8 mm, MLA <6 mm2; MLA 6-7.5 mm2 requires further physiologic See Table B for risk stratification based on noninvasive testing   Journal of the [employer] of Cardiology Mar 2017, 23391; DOI: 10.1016/j.jacc.2017.02.001 [employer].de.[email or web address].full-text.pdf This App  2018 by the Society for Cardiovascular Angiography and Interventions  Francyne Arreaga J Tyreck Bell

## 2020-01-23 ENCOUNTER — Encounter (HOSPITAL_COMMUNITY): Payer: Self-pay | Admitting: Cardiology

## 2020-02-08 NOTE — Progress Notes (Signed)
Spoke to patient she was transferred upfront to get scheduled.

## 2020-02-18 ENCOUNTER — Ambulatory Visit: Payer: Medicare HMO | Admitting: Cardiology

## 2020-03-04 ENCOUNTER — Encounter: Payer: Self-pay | Admitting: Cardiology

## 2020-03-04 ENCOUNTER — Ambulatory Visit: Payer: Medicare HMO | Admitting: Cardiology

## 2020-03-04 ENCOUNTER — Other Ambulatory Visit: Payer: Self-pay

## 2020-03-04 VITALS — BP 117/78 | HR 96 | Ht 68.0 in | Wt 286.0 lb

## 2020-03-04 DIAGNOSIS — I10 Essential (primary) hypertension: Secondary | ICD-10-CM

## 2020-03-04 DIAGNOSIS — R0609 Other forms of dyspnea: Secondary | ICD-10-CM

## 2020-03-04 DIAGNOSIS — Z712 Person consulting for explanation of examination or test findings: Secondary | ICD-10-CM

## 2020-03-04 DIAGNOSIS — R002 Palpitations: Secondary | ICD-10-CM

## 2020-03-04 DIAGNOSIS — Z6841 Body Mass Index (BMI) 40.0 and over, adult: Secondary | ICD-10-CM

## 2020-03-04 NOTE — Progress Notes (Signed)
Date:  03/04/2020   ID:  Mindi Curling, DOB 12-17-1965, MRN [id number]  PCP:  Bartholome Bill, MD  Cardiologist:  Rex Kras, DO, [facility] (established care 12/18/2019)  Date: 03/04/20 Last Office Visit: 01/08/2020  Chief Complaint  Patient presents with  . Results    Post heart catheterization follow-up    HPI  Wanda Mcintosh is a 54 y.o. female who presents to the office with a chief complaint of " heart catheterization follow-up." Patient's past medical history and cardiovascular risk factors include: Hypertension, obesity due to excess calories.   She is referred to the office at the request of Bartholome Bill, MD for evaluation of exertional dyspnea.  Since establishing care patient underwent an ischemic evaluation given her symptoms suggestive of possible anginal equivalents. Echocardiogram notes preserved LVEF, normal diastolic filling pattern, no significant valvular heart disease.  However, stress test suggested reversible ischemia in the possible RCA/LCx distribution.  However, overall accuracy of the study is limited due to soft tissue (BMI 43) and breast tissue attenuation artifact.  Due to continued symptoms despite medication titration.  Decision was to proceed with invasive angiography.  Since last office visit patient did undergo left heart catheterization and was noted to have normal epicardial coronary arteries.  Post procedure patient has been well without any complications.  Since last office visit clinically patient states that she denies any chest pain or angina factors.  Her dyspnea on exertion has improved markedly.  Patient states that she was scheduled to undergo sleep study but is currently on hold and she plans to move to [place], [place].  FUNCTIONAL STATUS: No structured exercise program or daily routine.   ALLERGIES: Allergies  Allergen Reactions  . Baclofen Other (See Comments) and Swelling    Lower limb swelling  . Celebrex  [Celecoxib] Other (See Comments)    Excessive bloating GI pain Paleness Excessive sleeping Mood change (aggression)  . Diclofenac Sodium Other (See Comments)    elevated liver enzymes  . Gabapentin Swelling    upset stomach Feet swell  . Nsaids Other (See Comments)    Mobic--upset stomach.  . Zofran Other (See Comments)    migraines    MEDICATION LIST PRIOR TO VISIT: Current Meds  Medication Sig  . AMBULATORY NON FORMULARY MEDICATION Medication Name: Diltiazem 2% gel with 5% Lidocaine Apply a pea sized amount internally four times daily. Dispense 30 GM zero refill  . aspirin-acetaminophen-caffeine (EXCEDRIN MIGRAINE) 250-250-65 MG tablet Take by mouth every 6 (six) hours as needed for headache.  . B Complex-C (B-COMPLEX WITH VITAMIN C) tablet Take 1 tablet by mouth daily.  . cyclobenzaprine (FLEXERIL) 10 MG tablet Take 10 mg by mouth as needed.  . diazepam (VALIUM) 5 MG tablet Take 5 mg by mouth as needed for anxiety.  . diclofenac sodium (VOLTAREN) 1 % GEL Apply 2 g topically 4 (four) times daily. (Patient taking differently: Apply 2 g topically 4 (four) times daily as needed (pain.).)  . DULoxetine (CYMBALTA) 20 MG capsule Take 2 capsules by mouth daily.  Marland Kitchen FIBER SELECT GUMMIES PO Take 2 tablets by mouth daily.  . fluticasone (FLONASE) 50 MCG/ACT nasal spray Place 1 spray into both nostrils 2 (two) times daily as needed for allergies.   . hepatitis A-hepatitis B (TWINRIX) 720-20 ELU-MCG/ML injection Inject 0.5 ml into muscle on day 0, 30 days, and 6 months  . HYDROcodone-acetaminophen (NORCO) 10-325 MG tablet Take 1 tablet by mouth 3 (three) times daily as needed (pain.).  Marland Kitchen  Lidocaine HCl 4 % CREA Apply 1 application topically 4 (four) times daily as needed (pain.).  Marland Kitchen lisinopril (ZESTRIL) 10 MG tablet Take 10 mg by mouth daily.  . meclizine (ANTIVERT) 25 MG tablet Take 25 mg by mouth 2 (two) times daily as needed (vertigo/dizziness).   . Multiple Vitamins-Minerals (ADULT GUMMY  PO) Take 2 tablets by mouth daily.  Marland Kitchen omeprazole (PRILOSEC) 20 MG capsule Take 40 mg by mouth daily before breakfast.   . rOPINIRole (REQUIP) 0.25 MG tablet Take 0.25 mg by mouth at bedtime.  . SUMAtriptan (IMITREX) 100 MG tablet Take 100 mg by mouth every 2 (two) hours as needed for migraine (max 2 doses/24 hrs.).   Marland Kitchen vitamin C (ASCORBIC ACID) 250 MG tablet Take 500 mg by mouth daily.     PAST MEDICAL HISTORY: Past Medical History:  Diagnosis Date  . Anemia   . Back injury   . Barrett's esophagus   . 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 Chronic back pain   . Complication of anesthesia    possible aggression coming out of anesthesia  . Gallstones   . GERD (gastroesophageal reflux disease)   . Head ache   . Migraine   . Osteoarthritis   . Status post dilation of esophageal narrowing   . Vertigo     PAST SURGICAL HISTORY: Past Surgical History:  Procedure Laterality Date  . BREAST EXCISIONAL BIOPSY Bilateral   . BREAST SURGERY Bilateral    rt fibroid 90. lft 94  . CARPAL TUNNEL RELEASE Left   . CARPAL TUNNEL RELEASE Right 12/2016  . CHOLECYSTECTOMY  07/16/2011   Procedure: LAPAROSCOPIC CHOLECYSTECTOMY WITH INTRAOPERATIVE CHOLANGIOGRAM;  Surgeon: Merrie Roof, MD;  Location: [facility];  Service: General;  Laterality: N/A;  . HAND SURGERY     cartilage  . LEEP    . LEFT HEART CATH AND CORONARY ANGIOGRAPHY N/A 01/22/2020   Procedure: LEFT HEART CATH AND CORONARY ANGIOGRAPHY;  Surgeon: Nigel Mormon, MD;  Location: [facility];  Service: Cardiovascular;  Laterality: N/A;  . TONSILLECTOMY      FAMILY HISTORY: The patient family history includes Breast cancer (age of onset: 31) in her mother; Colon polyps in her father, paternal uncle, and paternal uncle; Diabetes in her maternal aunt, maternal grandmother, and paternal grandmother; Hypertension in her father and mother; Irritable bowel syndrome in her son; Prostate cancer in her paternal grandfather and paternal  uncle.  SOCIAL HISTORY:  The patient  reports that she has never smoked. She has never used smokeless tobacco. She reports current alcohol use. She reports that she does not use drugs.  REVIEW OF SYSTEMS: Review of Systems  Constitutional: Negative for chills and fever.  HENT: Negative for hoarse voice and nosebleeds.   Eyes: Negative for discharge, double vision and pain.  Cardiovascular: Positive for dyspnea on exertion (improving). Negative for chest pain, claudication, leg swelling, near-syncope, orthopnea, palpitations, paroxysmal nocturnal dyspnea and syncope.  Respiratory: Negative for hemoptysis and shortness of breath.   Musculoskeletal: Negative for muscle cramps and myalgias.  Gastrointestinal: Negative for abdominal pain, constipation, diarrhea, hematemesis, hematochezia, melena, nausea and vomiting.  Neurological: Negative for difficulty with concentration, focal weakness and headaches.   PHYSICAL EXAM: Vitals with BMI 03/04/2020 01/22/2020 01/22/2020  Height 5' 8"  - -  Weight 286 lbs - -  BMI 96.7 - -  Systolic 591 638 95  Diastolic 78 65 49  Pulse 96 82 83    CONSTITUTIONAL: Well-developed and well-nourished. No  acute distress.  SKIN: Skin is warm and dry. No rash noted. No cyanosis. No pallor. No jaundice HEAD: Normocephalic and atraumatic.  EYES: No scleral icterus MOUTH/THROAT: Moist oral membranes.  NECK: No JVD present. No thyromegaly noted. No carotid bruits  LYMPHATIC: No visible cervical adenopathy.  CHEST Normal respiratory effort. No intercostal retractions  LUNGS: Clear to auscultation bilaterally. No stridor. No wheezes. No rales.  CARDIOVASCULAR: Regular rate and rhythm, positive T6-L4, Soft holosystolic murmur heard at the left lower sternal border,no rubs or gallops appreciated. ABDOMINAL: Obese, soft, nontender, nondistended, positive bowel sounds all 4 quadrants. No apparent ascites.  EXTREMITIES: No peripheral edema  HEMATOLOGIC: No significant  bruising NEUROLOGIC: Oriented to person, place, and time. Nonfocal. Normal muscle tone.  PSYCHIATRIC: Normal mood and affect. Normal behavior. Cooperative  CARDIAC DATABASE: EKG: 12/18/2019: Sinus  Rhythm, 90bpm, PRWP, low voltage -possible pulmonary disease, without underlying ischemia or injury pattern.   Echocardiogram: 12/20/2019:  Normal LV systolic function with visual EF 50-55%. Left ventricle cavity is normal in size. Mild left ventricular hypertrophy. Normal global wall motion. Normal diastolic filling pattern, normal LAP. Calculated EF 54%.  Right ventricle cavity is slightly dilated, visually. Normal right ventricular function.  No significant valvular abnormalities.  No prior study for comparison.   Stress Testing: Lexiscan/modified Bruce Tetrofosmin stress test 12/26/2019: Lexiscan/modified Bruce nuclear stress test performed using 1-day protocol. Stress EKG is non-diagnostic, as this is pharmacological stress test. In addition, stress EKG at 81% MPHR showed sinus tachycardia, low voltage, poor R wave progression, nonspecific ST-T changes in inferolateral leads. SPECT images medium sized area of mild intensity decrease in tracer uptake in apical to basal inferoseptal, inferior, inferolateral myocardium. Tissue attenuation is possible, although ischemia in this region cannot be excluded.  Stress LVEF 71%. Intermediate risk study.   Heart Catheterization: 01/22/2020: Angiographically normal coronary arteries with no coronary artery disease LVEDP 20 mmHg  7 day extended Holter monitor: Dominant rhythm normal sinus. Heart rate 54-156 bpm.  Avg HR 87 bpm. No atrial fibrillation, supraventricular tachycardia, NSVT, high grade AV block, pauses (3 seconds or longer). Total ventricular ectopic burden <1%. Total supraventricular ectopic burden <1%. Patient triggered events: 4.  Underlying rhythm is normal sinus followed by sinus tachycardia without any dysrhythmias.   LABORATORY  DATA: CBC Latest Ref Rng & Units 01/21/2020 05/05/2018 05/04/2018  WBC 3.4 - 10.8 x10E3/uL 5.8 4.7 4.2  Hemoglobin 11.1 - 15.9 g/dL 15.2 14.4 15.0  Hematocrit 34.0 - 46.6 % 45.0 43.0 46.4(H)  Platelets 150 - 450 x10E3/uL 242 147(L) 162    CMP Latest Ref Rng & Units 01/22/2020 12/25/2019 05/06/2018  Glucose 70 - 99 mg/dL 138(H) 107(H) 191(H)  BUN 6 - 20 mg/dL 14 14 13   Creatinine 0.44 - 1.00 mg/dL 0.76 0.84 0.74  Sodium 135 - 145 mmol/L 142 142 143  Potassium 3.5 - 5.1 mmol/L 3.8 4.1 5.0  Chloride 98 - 111 mmol/L 106 103 104  CO2 22 - 32 mmol/L 28 25 26   Calcium 8.9 - 10.3 mg/dL 9.7 9.9 9.2  Total Protein 6.0 - 8.5 g/dL - 6.7 -  Total Bilirubin 0.0 - 1.2 mg/dL - 0.4 -  Alkaline Phos 44 - 121 IU/L - 88 -  AST 0 - 40 IU/L - 63(H) -  ALT 0 - 32 IU/L - 100(H) -    Lipid Panel  No results found for: CHOL, TRIG, HDL, CHOLHDL, VLDL, LDLCALC, LDLDIRECT, LABVLDL  No components found for: NTPROBNP Recent Labs    12/25/19 1356  PROBNP 16  Recent Labs    12/25/19 1355  TSH 2.390    BMP Recent Labs    12/25/19 1355 01/22/20 1220  NA 142 142  K 4.1 3.8  CL 103 106  CO2 25 28  GLUCOSE 107* 138*  BUN 14 14  CREATININE 0.84 0.76  CALCIUM 9.9 9.7  GFRNONAA 80 >60  GFRAA 92  --     HEMOGLOBIN A1C No results found for: HGBA1C, MPG   External Labs: Collected: Jul 23, 2019 Creatinine 0.87 mg/dL. eGFR: 77 mL/min per 1.73 m AST 65, ALT 80, alkaline phosphatase 71  IMPRESSION:    ICD-10-CM   1. Dyspnea on exertion  R06.00   2. Palpitations  R00.2   3. Benign hypertension  I10   4. Class 3 severe obesity due to excess calories without serious comorbidity with body mass index (BMI) of 40.0 to 44.9 in adult [facility])  E66.01    Z68.41      RECOMMENDATIONS: Wanda Mcintosh is a 54 y.o. female whose past medical history and cardiac risk factors include: Hypertension, obesity due to excess calories.   Dyspnea on exertion: Improving  Echocardiogram notes preserved LVEF without  any significant valvular heart disease.  Nuclear stress test was abnormal concerning for reversible ischemia.  Since then patient did undergo left heart catheterization and noted to have normal epicardial coronary arteries.    Given her symptoms of palpitations she is also undergone 7 day extended Holter monitor results were discussed with her at today's office visit.    Medications reconciled  BNP within normal limits.  Would recommend increasing physical activity as tolerated with a goal of 30 minutes a day 5 days a week.  Educated on importance of lifestyle modifications and to improve her modifiable cardiovascular risk factors for primary prevention of CAD.  Palpitations: Resolved  TSH within normal limits.  Extended Holter monitor results, reviewed with the patient at today's office visit.  No additional work-up needed at this time. Findings noted above for further reference.  Benign essential hypertension . Office blood pressure is at goal.  . Medication reconciled.  . Low salt diet recommended. A diet that is rich in fruits, vegetables, legumes, and low-fat dairy products and low in snacks, sweets, and meats (such as the Dietary Approaches to Stop Hypertension [DASH] diet).  . Currently managed by primary care provider.  Patient plans to move to [place], [place].  She is more than welcome to follow-up with our practice as well on as-needed basis.  FINAL MEDICATION LIST END OF ENCOUNTER: No orders of the defined types were placed in this encounter.   Current Outpatient Medications:  .  AMBULATORY NON FORMULARY MEDICATION, Medication Name: Diltiazem 2% gel with 5% Lidocaine Apply a pea sized amount internally four times daily. Dispense 30 GM zero refill, Disp: 30 g, Rfl: 0 .  aspirin-acetaminophen-caffeine (EXCEDRIN MIGRAINE) 250-250-65 MG tablet, Take by mouth every 6 (six) hours as needed for headache., Disp: , Rfl:  .  B Complex-C (B-COMPLEX WITH VITAMIN C) tablet, Take 1 tablet  by mouth daily., Disp: , Rfl:  .  cyclobenzaprine (FLEXERIL) 10 MG tablet, Take 10 mg by mouth as needed., Disp: , Rfl:  .  diazepam (VALIUM) 5 MG tablet, Take 5 mg by mouth as needed for anxiety., Disp: , Rfl:  .  diclofenac sodium (VOLTAREN) 1 % GEL, Apply 2 g topically 4 (four) times daily. (Patient taking differently: Apply 2 g topically 4 (four) times daily as needed (pain.).), Disp: 900 g, Rfl: 0 .  DULoxetine (CYMBALTA) 20 MG capsule, Take 2 capsules by mouth daily., Disp: , Rfl:  .  FIBER SELECT GUMMIES PO, Take 2 tablets by mouth daily., Disp: , Rfl:  .  fluticasone (FLONASE) 50 MCG/ACT nasal spray, Place 1 spray into both nostrils 2 (two) times daily as needed for allergies. , Disp: , Rfl:  .  hepatitis A-hepatitis B (TWINRIX) 720-20 ELU-MCG/ML injection, Inject 0.5 ml into muscle on day 0, 30 days, and 6 months, Disp: 0.5 mL, Rfl: 2 .  HYDROcodone-acetaminophen (NORCO) 10-325 MG tablet, Take 1 tablet by mouth 3 (three) times daily as needed (pain.)., Disp: , Rfl:  .  Lidocaine HCl 4 % CREA, Apply 1 application topically 4 (four) times daily as needed (pain.)., Disp: , Rfl:  .  lisinopril (ZESTRIL) 10 MG tablet, Take 10 mg by mouth daily., Disp: , Rfl:  .  meclizine (ANTIVERT) 25 MG tablet, Take 25 mg by mouth 2 (two) times daily as needed (vertigo/dizziness). , Disp: , Rfl:  .  Multiple Vitamins-Minerals (ADULT GUMMY PO), Take 2 tablets by mouth daily., Disp: , Rfl:  .  omeprazole (PRILOSEC) 20 MG capsule, Take 40 mg by mouth daily before breakfast. , Disp: , Rfl:  .  rOPINIRole (REQUIP) 0.25 MG tablet, Take 0.25 mg by mouth at bedtime., Disp: , Rfl:  .  SUMAtriptan (IMITREX) 100 MG tablet, Take 100 mg by mouth every 2 (two) hours as needed for migraine (max 2 doses/24 hrs.). , Disp: , Rfl:  .  vitamin C (ASCORBIC ACID) 250 MG tablet, Take 500 mg by mouth daily., Disp: , Rfl:   No orders of the defined types were placed in this encounter.  There are no Patient Instructions on file for  this visit.   --Continue cardiac medications as reconciled in final medication list. --Return if symptoms worsen or fail to improve. Or sooner if needed. --Continue follow-up with your primary care physician regarding the management of your other chronic comorbid conditions.  Patient's questions and concerns were addressed to her satisfaction. She voices understanding of the instructions provided during this encounter.   This note was created using a voice recognition software as a result there may be grammatical errors inadvertently enclosed that do not reflect the nature of this encounter. Every attempt is made to correct such errors.  Total time spent: 20 minutes.  Rex Kras, [place], [facility]  Pager: [phone] Office: [phone]
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
